# Patient Record
Sex: Male | Born: 1960 | Race: Black or African American | Hispanic: No | Smoking: Former smoker
Health system: Southern US, Community
[De-identification: ages and names within clinical notes are randomized; demographics above are authoritative.]

## PROBLEM LIST (undated history)

## (undated) DIAGNOSIS — M519 Unspecified thoracic, thoracolumbar and lumbosacral intervertebral disc disorder: Secondary | ICD-10-CM

## (undated) DIAGNOSIS — R51 Headache: Secondary | ICD-10-CM

## (undated) DIAGNOSIS — Z8719 Personal history of other diseases of the digestive system: Secondary | ICD-10-CM

## (undated) DIAGNOSIS — E785 Hyperlipidemia, unspecified: Secondary | ICD-10-CM

## (undated) DIAGNOSIS — K219 Gastro-esophageal reflux disease without esophagitis: Secondary | ICD-10-CM

## (undated) DIAGNOSIS — R0602 Shortness of breath: Secondary | ICD-10-CM

## (undated) DIAGNOSIS — I499 Cardiac arrhythmia, unspecified: Secondary | ICD-10-CM

## (undated) DIAGNOSIS — R519 Headache, unspecified: Secondary | ICD-10-CM

## (undated) DIAGNOSIS — Q245 Malformation of coronary vessels: Secondary | ICD-10-CM

## (undated) DIAGNOSIS — I428 Other cardiomyopathies: Secondary | ICD-10-CM

## (undated) DIAGNOSIS — I2510 Atherosclerotic heart disease of native coronary artery without angina pectoris: Secondary | ICD-10-CM

## (undated) DIAGNOSIS — I209 Angina pectoris, unspecified: Secondary | ICD-10-CM

## (undated) DIAGNOSIS — IMO0002 Reserved for concepts with insufficient information to code with codable children: Secondary | ICD-10-CM

## (undated) DIAGNOSIS — G4733 Obstructive sleep apnea (adult) (pediatric): Secondary | ICD-10-CM

## (undated) DIAGNOSIS — I219 Acute myocardial infarction, unspecified: Secondary | ICD-10-CM

## (undated) DIAGNOSIS — N400 Enlarged prostate without lower urinary tract symptoms: Secondary | ICD-10-CM

## (undated) DIAGNOSIS — I10 Essential (primary) hypertension: Secondary | ICD-10-CM

## (undated) DIAGNOSIS — R9430 Abnormal result of cardiovascular function study, unspecified: Secondary | ICD-10-CM

## (undated) DIAGNOSIS — R001 Bradycardia, unspecified: Secondary | ICD-10-CM

## (undated) HISTORY — DX: Unspecified thoracic, thoracolumbar and lumbosacral intervertebral disc disorder: M51.9

## (undated) HISTORY — PX: WISDOM TOOTH EXTRACTION: SHX21

## (undated) HISTORY — DX: Bradycardia, unspecified: R00.1

## (undated) HISTORY — DX: Reserved for concepts with insufficient information to code with codable children: IMO0002

## (undated) HISTORY — PX: OTHER SURGICAL HISTORY: SHX169

## (undated) HISTORY — DX: Atherosclerotic heart disease of native coronary artery without angina pectoris: I25.10

## (undated) HISTORY — DX: Essential (primary) hypertension: I10

## (undated) HISTORY — DX: Shortness of breath: R06.02

## (undated) HISTORY — DX: Other cardiomyopathies: I42.8

## (undated) HISTORY — DX: Obstructive sleep apnea (adult) (pediatric): G47.33

## (undated) HISTORY — PX: COLONOSCOPY: SHX174

## (undated) HISTORY — PX: ESOPHAGOGASTRODUODENOSCOPY: SHX1529

## (undated) HISTORY — DX: Malformation of coronary vessels: Q24.5

## (undated) HISTORY — DX: Abnormal result of cardiovascular function study, unspecified: R94.30

## (undated) HISTORY — DX: Morbid (severe) obesity due to excess calories: E66.01

## (undated) HISTORY — DX: Hyperlipidemia, unspecified: E78.5

## (undated) HISTORY — DX: Gastro-esophageal reflux disease without esophagitis: K21.9

---

## 1999-03-26 ENCOUNTER — Emergency Department (HOSPITAL_COMMUNITY): Admission: EM | Admit: 1999-03-26 | Discharge: 1999-03-26 | Payer: Self-pay | Admitting: Emergency Medicine

## 1999-03-26 ENCOUNTER — Encounter: Payer: Self-pay | Admitting: Emergency Medicine

## 2004-02-28 ENCOUNTER — Ambulatory Visit (HOSPITAL_COMMUNITY): Admission: RE | Admit: 2004-02-28 | Discharge: 2004-02-28 | Payer: Self-pay | Admitting: Neurological Surgery

## 2004-12-04 ENCOUNTER — Ambulatory Visit: Payer: Self-pay | Admitting: Cardiology

## 2004-12-12 ENCOUNTER — Ambulatory Visit: Payer: Self-pay | Admitting: Cardiology

## 2004-12-20 ENCOUNTER — Ambulatory Visit: Payer: Self-pay | Admitting: Cardiology

## 2004-12-20 ENCOUNTER — Inpatient Hospital Stay (HOSPITAL_BASED_OUTPATIENT_CLINIC_OR_DEPARTMENT_OTHER): Admission: RE | Admit: 2004-12-20 | Discharge: 2004-12-20 | Payer: Self-pay | Admitting: Cardiology

## 2004-12-20 HISTORY — PX: CARDIAC CATHETERIZATION: SHX172

## 2004-12-26 ENCOUNTER — Ambulatory Visit: Payer: Self-pay | Admitting: Cardiology

## 2005-01-22 ENCOUNTER — Ambulatory Visit: Payer: Self-pay | Admitting: Cardiology

## 2005-04-29 IMAGING — CT CT L SPINE W/ CM
3 of 9 series · 13 of 36 positions shown, 14 images · IV contrast (omnipaque)
Comparison: none

CLINICAL DATA: Low back pain.  Radiculopathy.  
LUMBAR MYELOGRAM
Dr. Gege De Rodriguez instilled 13 cc Omnipaque 180 into the subarachnoid space at the L3-4 level.  
Mild to moderate disc space narrowing and vertebral body osteophytic formations at L4-5.  Prominent anterior extradural defect L4-5 superimposed on a canal, which has a caliber probably towards the lower limits of normal on a congenital basis.  Smaller anterior extradural defect L2-3.  Anterolateral extradural defects on the thecal sac with mass effect on the L5 nerve roots.  Anterolateral extradural defects more prominent on the left than on the right.  No spondylolisthesis.  Standing lateral views under fluoroscopy were obtained in a neutral flexion and extension positions.  Decreased range of motion.  No spondylolisthesis.
IMPRESSION 
Most impressive finding noted at myelography is spinal stenosis at L4-5, likely multifactorial and accentuated by an HNP.  CT is to follow.
POSTMYELOGRAM CT SCAN LUMBAR SPINE (ANTEROTHECAL CONTRAST ? 13 CC OMNIPAQUE 180)
Transaxial cuts were acquired from L3 to S1.  From the axial data set, images were reconstructed in coronal and sagittal planes.  Findings are as follows:
L3-4:  Mild to moderate multifactorial stenosis of the central canal and lateral recesses.  Mild diffuse annular bulging and vertebral body osteophytic formation.  Small posterolateral/lateral disc protrusion involving the foramen and encroaching on the left L3 nerve root.  
L4-5:  Marked stenosis of the central canal on the lateral recesses, multifactorial.  Diffuse annular bulging and hypertrophied facets and ligamentum flavum.  Encroachment on compression of L5 nerve roots in the lateral recesses and also some encroachment on L4 roots in the foramina.  There is an HNP which is central and paracentral on the left with compression of the thecal sac and marked encroachment on the left at L5 nerve root.  There is also focal bony hypertrophy paracentrally on the left.
L5-S1:  Degenerative disc disease changes.  Stenosis of the left foramen due to multiple factors including prominent eccentric osteophytes emanating off the posterolateral aspect of the vertebral bodies in addition to disc protrusion/bulging.  Marked encroachment on the left L5 nerve root both within and lateral to the foramen.  Left L5-S1 foramen is particularly narrowed in cephalocaudal dimension as a result of the prominent osteophyte emanating off an L5 body.  Degenerative changes SI joints are noted. 
Multifactorial stenosis of lumbar spine.  Stenotic changes are particularly marked at L4-5 and are accentuated by disc herniation, which is mainly on the left.  Marked stenosis of the left L5-S1 foramen and marked encroachment on the left L5 nerve root laterally and also encroachment on the S1 nerve root within the central canal by soft disc herniation and osteophytic formation (hard disk).
CT MULTIPLANAR RECONSTRUCTION LUMBAR SPINE
Multiplanar reformatted CT images were reconstructed from the axial CT data set.  These images were reviewed and pertinent findings are included in the accompanying complete CT report.
IMPRESSION
See complete CT report.

[Series 2: l-spine helical · axial · 0.27mm/px · z∈[-306,-239]mm · 4 of 46 slices shown, 5 images]
[im 10/46  soft-tissue]
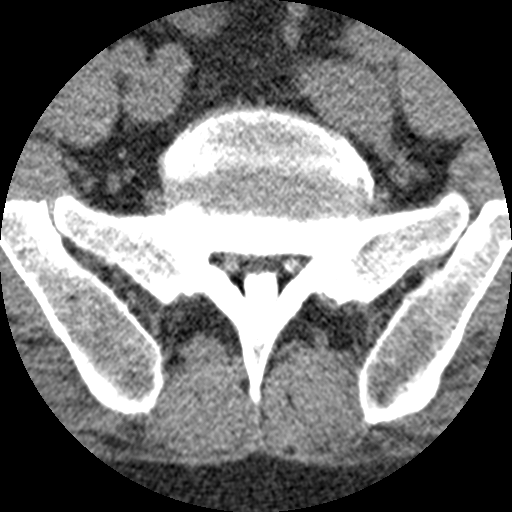
[im 10/46  bone]
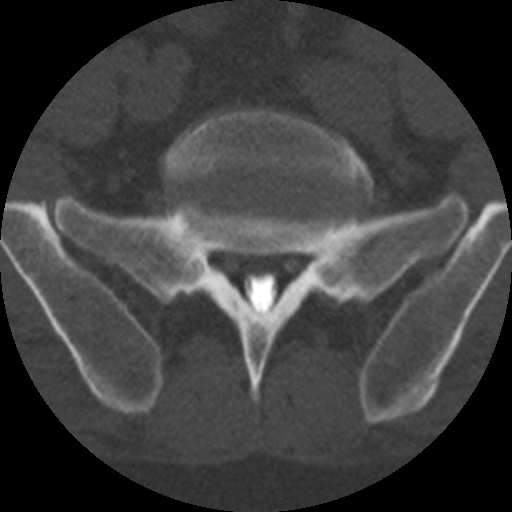
[im 19/46  bone]
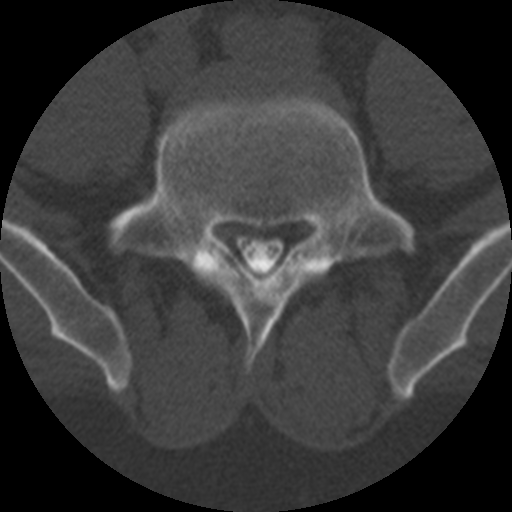
[im 28/46  bone]
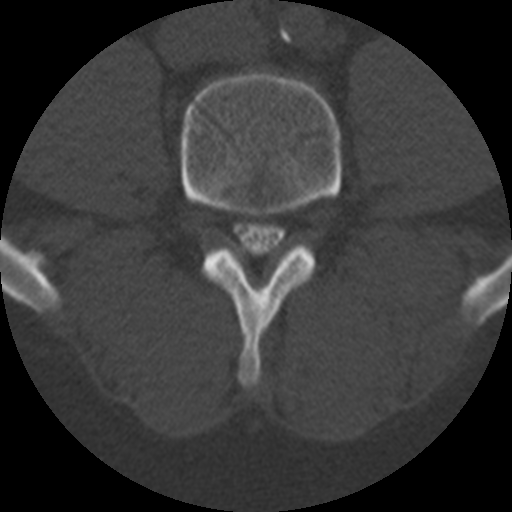
[im 37/46  bone]
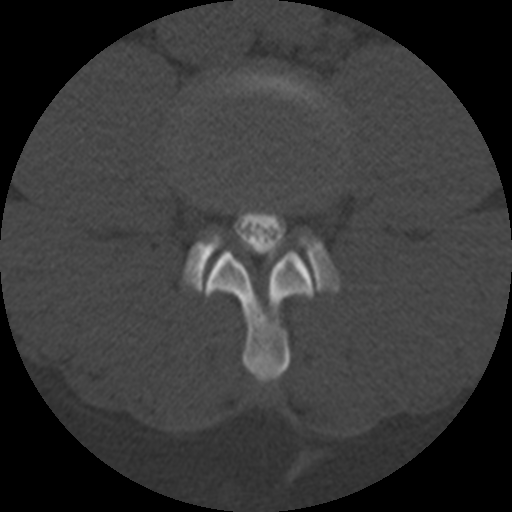

[Series 3: recon 2: l-spine helical · axial · 0.27mm/px · z∈[-301,-246]mm · 3 of 46 slices shown]
[im 12/46  bone]
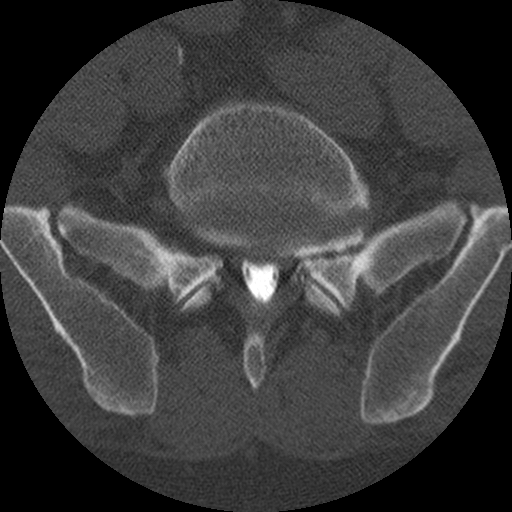
[im 23/46  bone]
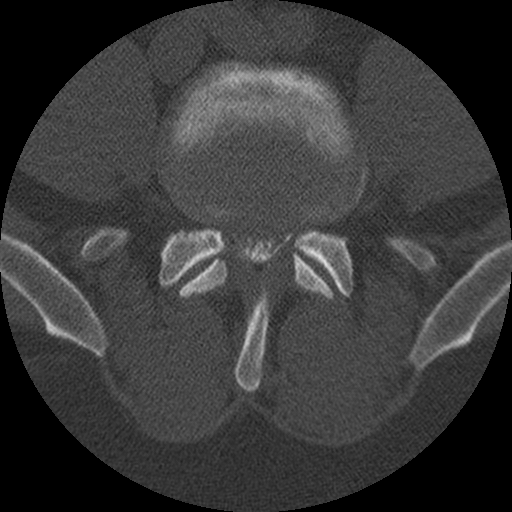
[im 34/46  bone]
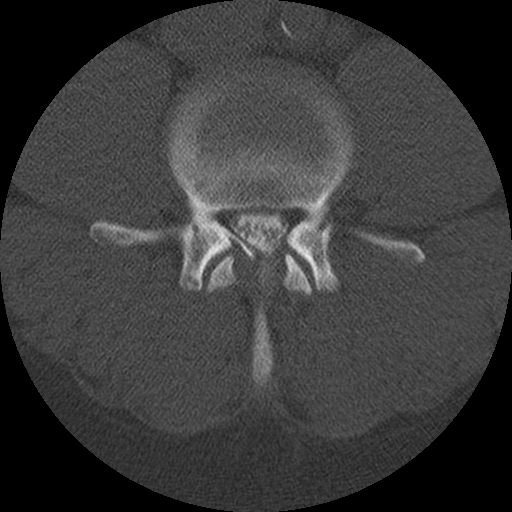

[Series 104: reformatted · coronal · 0.27mm/px · 6 of 53 slices shown]
[im 18/53  bone]
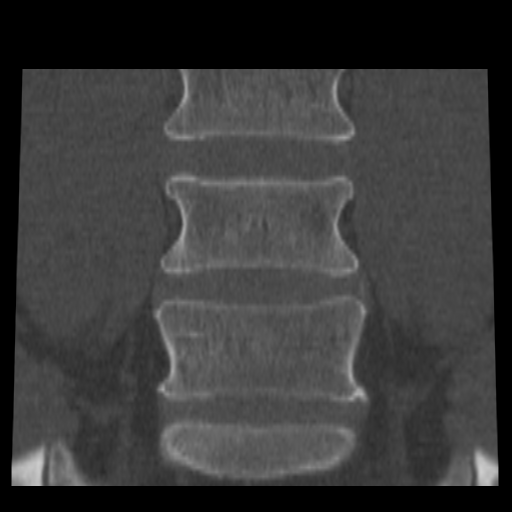
[im 22/53  bone]
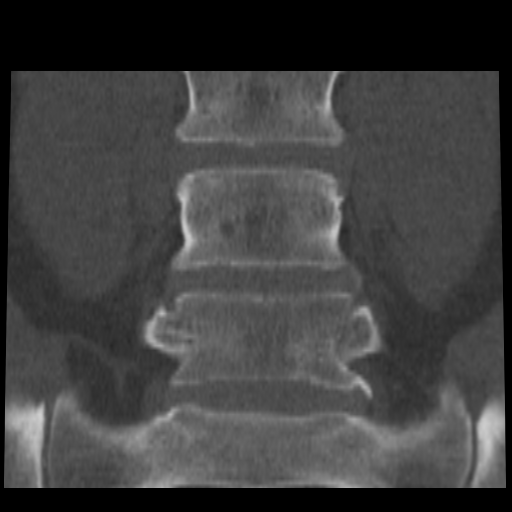
[im 26/53  soft-tissue]
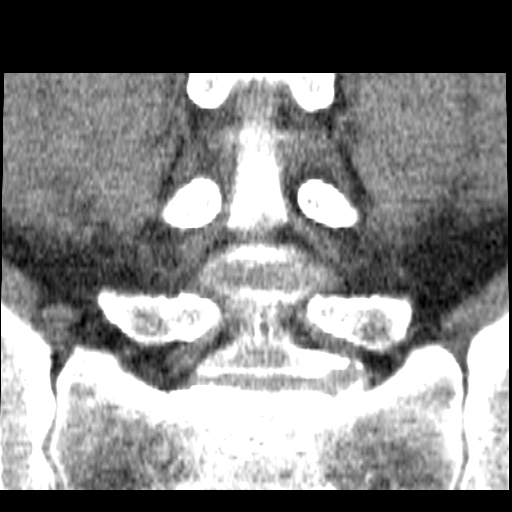
[im 27/53  bone]
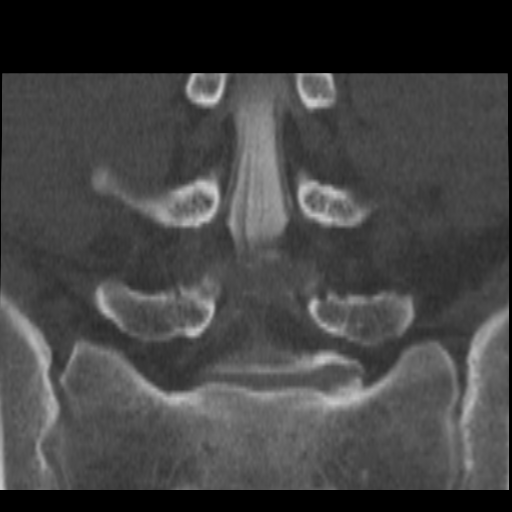
[im 31/53  bone]
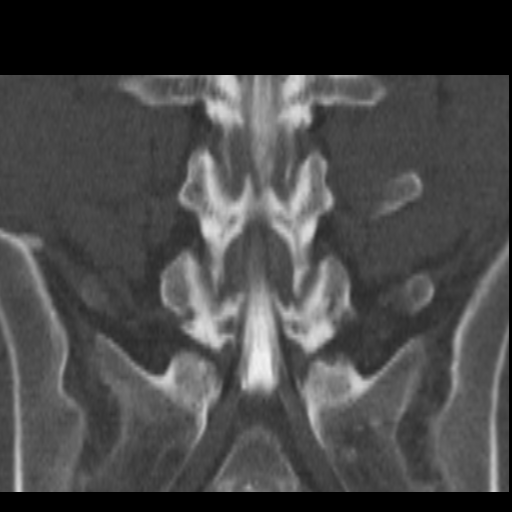
[im 35/53  bone]
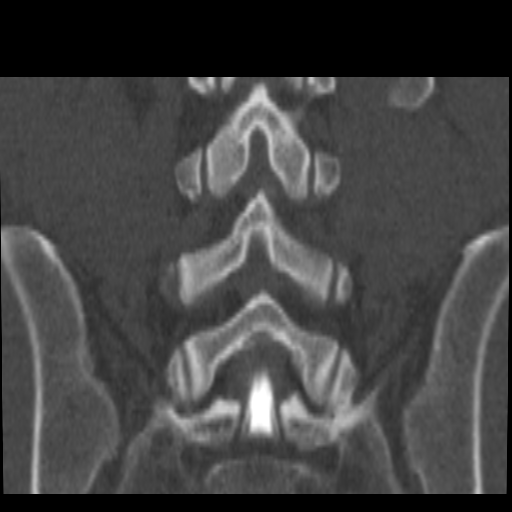

[13 of 36 positions shown; findings below may reference images not displayed]

## 2011-03-24 DIAGNOSIS — R079 Chest pain, unspecified: Secondary | ICD-10-CM

## 2011-04-03 DIAGNOSIS — R079 Chest pain, unspecified: Secondary | ICD-10-CM

## 2011-04-04 ENCOUNTER — Encounter: Payer: Self-pay | Admitting: Physician Assistant

## 2011-04-23 ENCOUNTER — Ambulatory Visit (INDEPENDENT_AMBULATORY_CARE_PROVIDER_SITE_OTHER): Payer: Medicaid Other | Admitting: Cardiology

## 2011-04-23 ENCOUNTER — Encounter: Payer: Self-pay | Admitting: *Deleted

## 2011-04-23 ENCOUNTER — Encounter: Payer: Self-pay | Admitting: Cardiology

## 2011-04-23 DIAGNOSIS — E785 Hyperlipidemia, unspecified: Secondary | ICD-10-CM | POA: Insufficient documentation

## 2011-04-23 DIAGNOSIS — K219 Gastro-esophageal reflux disease without esophagitis: Secondary | ICD-10-CM | POA: Insufficient documentation

## 2011-04-23 DIAGNOSIS — I428 Other cardiomyopathies: Secondary | ICD-10-CM | POA: Insufficient documentation

## 2011-04-23 DIAGNOSIS — I10 Essential (primary) hypertension: Secondary | ICD-10-CM

## 2011-04-23 DIAGNOSIS — R9430 Abnormal result of cardiovascular function study, unspecified: Secondary | ICD-10-CM | POA: Insufficient documentation

## 2011-04-23 DIAGNOSIS — R001 Bradycardia, unspecified: Secondary | ICD-10-CM | POA: Insufficient documentation

## 2011-04-23 DIAGNOSIS — Q245 Malformation of coronary vessels: Secondary | ICD-10-CM

## 2011-04-23 DIAGNOSIS — I2510 Atherosclerotic heart disease of native coronary artery without angina pectoris: Secondary | ICD-10-CM

## 2011-04-23 DIAGNOSIS — M519 Unspecified thoracic, thoracolumbar and lumbosacral intervertebral disc disorder: Secondary | ICD-10-CM | POA: Insufficient documentation

## 2011-04-23 NOTE — Assessment & Plan Note (Signed)
This finding does not require any further workup.

## 2011-04-23 NOTE — Assessment & Plan Note (Signed)
Ejection fraction now is 50%.  No further workup is needed.

## 2011-04-23 NOTE — Patient Instructions (Signed)
Follow up as scheduled. Your physician recommends that you continue on your current medications as directed. Please refer to the Current Medication list given to you today. 

## 2011-04-23 NOTE — Assessment & Plan Note (Signed)
In 2000, there was no significant coronary disease.  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  In 2006 catheterization did show some diffuse irregularities but no significant stenoses.  The current echo reveals an ejection fraction of 50%.  There seems to be hypokinesis of the base of the inferior wall.  Patient has had some wall motion abnormalities described in the past when his coronaries revealed no significant abnormalities.  There was question of a nuclear abnormality in 2006 when the patient had no significant stenoses.  It is very difficult to know how to proceed in this situation.  His symptoms at this time did not seem to be compatible with exertional angina.  I decided to watch him clinically and bring him back for followup.  He has felt well for several weeks.  If he has return of significant symptoms we will proceed with repeat catheterization.

## 2011-04-23 NOTE — Assessment & Plan Note (Signed)
He clearly needs to lose weight and he is encouraged by me to do so.

## 2011-04-23 NOTE — Assessment & Plan Note (Signed)
The patient may have some GERD.  This might well some of his symptoms.  We will recommend a PPI

## 2011-04-23 NOTE — Progress Notes (Signed)
HPI Patient is seen for cardiac evaluation posthospitalization.  I have reviewed the hospital records from his hospitalization on March 24, 2011.  I reviewed the H&P and discharge summary.  I reviewed the labs.  I reviewed the cardiology consultation.  I reviewed the echo data.  The patient was hospitalized with some chest discomfort.  He has had this problem over the years.  In 2000, there is report of catheterization revealing normal coronaries but an ejection fraction of 30%.  Over time his EF improved to 50%.  In 2006 cardiac catheterization was done again.  At that time there was question the possibility of inferior scar on nuclear scan.  At catheterization revealed only very slight coronary disease although there were some diffuse irregularities.  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  After the patient's recent hospitalization he has again had some symptoms on 2 occasions.  He describes walking through a store and then feeling shortness of breath and dizziness.  He does not have significant palpitations.  He has been stable over the past few weeks. No Known Allergies  Current Outpatient Prescriptions  Medication Sig Dispense Refill  . alfuzosin (UROXATRAL) 10 MG 24 hr tablet Take 10 mg by mouth daily.        Marland Kitchen aspirin 81 MG EC tablet Take 81 mg by mouth daily.        Marland Kitchen atenolol (TENORMIN) 50 MG tablet Take 50 mg by mouth daily.        Marland Kitchen gabapentin (NEURONTIN) 600 MG tablet Take 600 mg by mouth daily as needed.       Marland Kitchen glipiZIDE (GLUCOTROL XL) 10 MG 24 hr tablet Take 10 mg by mouth daily.        . insulin lispro protamine-insulin lispro (HUMALOG 75/25) (75-25) 100 UNIT/ML SUSP Inject into the skin as directed.        Marland Kitchen lisinopril-hydrochlorothiazide (PRINZIDE,ZESTORETIC) 20-25 MG per tablet Take 2 tablets by mouth daily.       . metFORMIN (GLUCOPHAGE) 1000 MG tablet Take 1,000 mg by mouth 2 (two) times daily with a meal.        .  omeprazole (PRILOSEC) 20 MG capsule Take 20 mg by mouth 3 (three) times daily.        Marland Kitchen oxyCODONE-acetaminophen (PERCOCET) 10-325 MG per tablet Take 1 tablet by mouth as needed.        . pravastatin (PRAVACHOL) 40 MG tablet Take 40 mg by mouth daily.        . traMADol (ULTRAM) 50 MG tablet Take 50 mg by mouth every 6 (six) hours as needed.        . verapamil (CALAN) 120 MG tablet Take 120 mg by mouth 2 (two) times daily.          History   Social History  . Marital Status: Married    Spouse Name: N/A    Number of Children: N/A  . Years of Education: N/A   Occupational History  . DISABLED     disc disease,DM   Social History Main Topics  . Smoking status: Former Smoker -- 1.0 packs/day for 5 years    Types: Cigarettes    Quit date: 09/22/1989  . Smokeless tobacco: Former Neurosurgeon    Types: Snuff    Quit date: 09/22/1989   Comment: dipped snuff for one year  . Alcohol Use: Not on file  . Drug Use: Not on file  . Sexually  Active: Not on file   Other Topics Concern  . Not on file   Social History Narrative   Lives in [place] with wife.     Family History  Problem Relation Age of Onset  . Other Sister     has pacemaker  . Other Brother     has pacemaker    Past Medical History  Diagnosis Date  . 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 Ejection fraction     Improved from the past, EF 50%, echo, July, 2012  . Hypertension   . Dyslipidemia   . Diabetes mellitus   . GERD (gastroesophageal reflux disease)   . 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 Morbid obesity   . Lumbar disc disease   . Bradycardia     July, 2012, related to medication  . CAD (coronary artery disease)     Very minimal coronary disease by catheterization in 2006    Past Surgical History  Procedure Date  . Cardiac  catheterization 12/20/2004    EF was 60% with normal wall motion. Nonobstructive coronary artery disease.  . None     no surgeries noted    ROS  Patient denies fever, chills, headache, sweats, rash, change in vision, change in hearing, chest pain, cough, nausea vomiting, urinary symptoms.  All the systems are reviewed and are negative  PHYSICAL EXAM Patient is here today with his wife.  He is significantly overweight.  He is stable.  There is no xanthelasma.  Lungs are clear.  Respiratory effort is nonlabored.  There is no jugular venous distention.  Cardiac exam reveals S1 and S2.  There are no clicks or significant murmurs.  The abdomen is soft.  There is no peripheral edema.  There no musculoskeletal deformities.  There are no significant skin rashes. Filed Vitals:   04/23/11 1442  BP: 129/80  Pulse: 63  Height: 5\' 11"  (1.803 m)  Weight: 314 lb (142.429 kg)    EKG Is not done today.  ASSESSMENT & PLAN

## 2011-04-23 NOTE — Assessment & Plan Note (Signed)
Blood pressure is under reasonable control at this time.  No change in therapy.

## 2011-06-06 ENCOUNTER — Encounter: Payer: Self-pay | Admitting: Cardiology

## 2011-06-06 ENCOUNTER — Ambulatory Visit (INDEPENDENT_AMBULATORY_CARE_PROVIDER_SITE_OTHER): Payer: Medicaid Other | Admitting: Cardiology

## 2011-06-06 DIAGNOSIS — R001 Bradycardia, unspecified: Secondary | ICD-10-CM

## 2011-06-06 DIAGNOSIS — R0602 Shortness of breath: Secondary | ICD-10-CM

## 2011-06-06 DIAGNOSIS — I498 Other specified cardiac arrhythmias: Secondary | ICD-10-CM

## 2011-06-06 DIAGNOSIS — I2510 Atherosclerotic heart disease of native coronary artery without angina pectoris: Secondary | ICD-10-CM

## 2011-06-06 MED ORDER — FUROSEMIDE 20 MG PO TABS: 20.0000 mg | ORAL_TABLET | Freq: Every day | ORAL | Status: DC

## 2011-06-06 NOTE — Patient Instructions (Signed)
Follow up as scheduled. Start Lasix (furosemide) 20 mg daily.

## 2011-06-06 NOTE — Assessment & Plan Note (Signed)
The patient's shortness of breath may be from volume overload.  He does have some edema today.  He may also be having ischemia.  He is on a small dose of hydrochlorothiazide as part of one of his medications.  I decided not to change this but in fact add a small dose of Lasix to this.  I talked to him about salt and fluid limitation.  I'll see him back over several weeks to follow his status.  If he diuresis and feels better all watch him.  If he is not improved I will have to reconsider catheterization.

## 2011-06-06 NOTE — Progress Notes (Signed)
HPI Patient is seen today to followup shortness of breath.  I saw him last August 1, 201 at that time I reviewed the data with him concerning his coronary artery disease.  There is question of slight ischemia by nuclear scan, but he has had normal catheterizations in the past. ( it is noted that the last one was in 2006).  Today the patient says that he has shortness of breath.  There may be some PND and orthopnea.  There is some edema.  There is exertional shortness of breath.  Is not having any significant chest pain. No Known Allergies  Current Outpatient Prescriptions  Medication Sig Dispense Refill  . alfuzosin (UROXATRAL) 10 MG 24 hr tablet Take 10 mg by mouth daily.        Marland Kitchen aspirin 81 MG EC tablet Take 81 mg by mouth daily.        Marland Kitchen atenolol (TENORMIN) 50 MG tablet Take 50 mg by mouth daily.        Marland Kitchen gabapentin (NEURONTIN) 600 MG tablet Take 600 mg by mouth daily as needed.       Marland Kitchen glipiZIDE (GLUCOTROL XL) 10 MG 24 hr tablet Take 10 mg by mouth daily.        . insulin lispro protamine-insulin lispro (HUMALOG 75/25) (75-25) 100 UNIT/ML SUSP Inject into the skin as directed.        Marland Kitchen lisinopril-hydrochlorothiazide (PRINZIDE,ZESTORETIC) 20-25 MG per tablet Take 2 tablets by mouth daily.       . metFORMIN (GLUCOPHAGE) 1000 MG tablet Take 1,000 mg by mouth 2 (two) times daily with a meal.        . omeprazole (PRILOSEC) 20 MG capsule Take 20 mg by mouth 2 (two) times daily.       Marland Kitchen oxyCODONE-acetaminophen (PERCOCET) 10-325 MG per tablet Take 1 tablet by mouth as needed.        . pravastatin (PRAVACHOL) 40 MG tablet Take 40 mg by mouth daily.        . traMADol (ULTRAM) 50 MG tablet Take 50 mg by mouth every 6 (six) hours as needed.        . verapamil (CALAN) 120 MG tablet Take 120 mg by mouth 2 (two) times daily.          History   Social History  . Marital Status: Married    Spouse Name: N/A    Number of Children: N/A  . Years of Education: N/A   Occupational History  . DISABLED    disc disease,DM   Social History Main Topics  . Smoking status: Former Smoker -- 1.0 packs/day for 5 years    Types: Cigarettes    Quit date: 09/22/1989  . Smokeless tobacco: Former Neurosurgeon    Types: Snuff    Quit date: 09/22/1989   Comment: dipped snuff for one year  . Alcohol Use: Not on file  . Drug Use: Not on file  . Sexually Active: Not on file   Other Topics Concern  . Not on file   Social History Narrative   Lives in [place] with wife.     Family History  Problem Relation Age of Onset  . Other Sister     has pacemaker  . Other Brother     has pacemaker    Past Medical History  Diagnosis Date  . Nonischemic cardiomyopathy     Catheterization 2000, normal coronaries, reduced ejection fraction  /  catheterization 2006 minimal scattered disease, anomalous right coronary artery from  left cusp  . Ejection fraction     Improved from the past  /  ejection fraction 50%, echo, July, 2012, hypokinesis at the base of the inferior wall.  . Hypertension   . Dyslipidemia   . Diabetes mellitus   . GERD (gastroesophageal reflux disease)   . 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 Morbid obesity   . Lumbar disc disease   . Bradycardia     July, 2012, related to medication  . CAD (coronary artery disease)     Some coronary irregularities by catheterization 2006 /  nuclear, July, 20 12  ,  question of some ischemia in the lateral wall although technically quite difficult.    Past Surgical History  Procedure Date  . Cardiac catheterization 12/20/2004    EF was 60% with normal wall motion. Nonobstructive coronary artery disease.  . None     no surgeries noted    ROS  Patient denies fever, chills, headache, sweats, rash, change in vision, change in hearing, chest pain, cough, nausea vomiting, urinary symptoms.  All other systems are reviewed and are negative.  PHYSICAL EXAM Patient is overweight.  Head is atraumatic.  He is oriented to person time and  place.  Affect is normal.  Lungs are clear.  Respiratory effort is unlabored.  Cardiac exam reveals S1-S2 regular no clicks or significant murmurs.  The abdomen is protuberant but soft.  There is 1+ peripheral edema.  There are no musculoskeletal deformities.  No skin rashes. Filed Vitals:   06/06/11 1333  BP: 137/83  Pulse: 61  Resp: 16  Height: 5\' 11"  (1.803 m)  Weight: 316 lb (143.337 kg)    EKG EKG is not done today. ASSESSMENT & PLAN

## 2011-06-06 NOTE — Assessment & Plan Note (Signed)
I do not think the bradycardia is playing a major role in his symptoms.

## 2011-06-06 NOTE — Assessment & Plan Note (Signed)
Patient has only minimal coronary disease.  However he could have progressed since 2006.

## 2011-06-27 ENCOUNTER — Ambulatory Visit (INDEPENDENT_AMBULATORY_CARE_PROVIDER_SITE_OTHER): Payer: Medicaid Other | Admitting: Physician Assistant

## 2011-06-27 ENCOUNTER — Encounter: Payer: Self-pay | Admitting: Cardiology

## 2011-06-27 DIAGNOSIS — I428 Other cardiomyopathies: Secondary | ICD-10-CM

## 2011-06-27 DIAGNOSIS — G4730 Sleep apnea, unspecified: Secondary | ICD-10-CM | POA: Insufficient documentation

## 2011-06-27 DIAGNOSIS — I10 Essential (primary) hypertension: Secondary | ICD-10-CM

## 2011-06-27 DIAGNOSIS — R0602 Shortness of breath: Secondary | ICD-10-CM

## 2011-06-27 NOTE — Assessment & Plan Note (Signed)
Will refer to Dr Fredonia Highland, regarding management of his CPAP parameters. Patient reports having lost instructions as to use of the device.

## 2011-06-27 NOTE — Assessment & Plan Note (Signed)
Recently placed on low dose Lasix, with no appreciable change in symptoms or weight. Will check f/u BMET.

## 2011-06-27 NOTE — Patient Instructions (Signed)
Your physician recommends that you go to the [facility] for lab work for FLP & CMET.  Reminder:  Nothing to eat or drink after 12 midnight prior to labs. If the results of your test are normal or stable, you will receive a letter.  If they are abnormal, the nurse will contact you by phone. Referral to Dr. Andrey Campanile for manangement of CPAP Your physician wants you to follow up in:  4 months.  You will receive a reminder letter in the mail one-two months in advance.  If you don't receive a letter, please call our office to schedule the follow up appointment

## 2011-06-27 NOTE — Assessment & Plan Note (Signed)
Stable on current medication regimen. Follow up with Dr Olena Leatherwood, as scheduled.

## 2011-06-27 NOTE — Progress Notes (Signed)
HPI: patient returns for early scheduled follow up.  He reports no appreciable change in his baseline, since last seen on August 1st. He has not had any f/u labs, since being placed on low dose Lasix. He is due to see Dr Olena Leatherwood for scheduled f/u, next week.  He denies any exertional CP, and reports improvement in his DOE. Interestingly, he suggests that he is SOB with exertion, only when his hiatal hernia is bothering him. He states this was diagnosed years ago. He has reflux symptoms on occasion. He has also been diagnosed with OSA, but has not been compliant with his CPAP, in several months.  Patient did undergo a post hospital ischemic evaluation with a Lexiscan Cardiolite, on August 11th, yielding possible anterolateral wall ischemia; EF 46%. Most recent catheterization in 2006, at [facility], suggested diffuse irregularities with NL LVF, and anomalous RCA from the left coronary cusp.  No Known Allergies  Current Outpatient Prescriptions on File Prior to Visit  Medication Sig Dispense Refill  . alfuzosin (UROXATRAL) 10 MG 24 hr tablet Take 10 mg by mouth daily.        Marland Kitchen atenolol (TENORMIN) 50 MG tablet Take 50 mg by mouth daily.        . furosemide (LASIX) 20 MG tablet Take 1 tablet (20 mg total) by mouth daily.  30 tablet  6  . gabapentin (NEURONTIN) 600 MG tablet Take 600 mg by mouth daily as needed.       Marland Kitchen glipiZIDE (GLUCOTROL XL) 10 MG 24 hr tablet Take 10 mg by mouth daily.        . insulin lispro protamine-insulin lispro (HUMALOG 75/25) (75-25) 100 UNIT/ML SUSP Inject into the skin as directed.        Marland Kitchen lisinopril-hydrochlorothiazide (PRINZIDE,ZESTORETIC) 20-25 MG per tablet Take 2 tablets by mouth daily.       . metFORMIN (GLUCOPHAGE) 1000 MG tablet Take 1,000 mg by mouth 2 (two) times daily with a meal.        . omeprazole (PRILOSEC) 20 MG capsule Take 20 mg by mouth 2 (two) times daily.       Marland Kitchen oxyCODONE-acetaminophen (PERCOCET) 10-325 MG per tablet Take 1 tablet by mouth as  needed.        . pravastatin (PRAVACHOL) 40 MG tablet Take 40 mg by mouth daily.        . traMADol (ULTRAM) 50 MG tablet Take 50 mg by mouth every 6 (six) hours as needed.        . verapamil (CALAN) 120 MG tablet Take 120 mg by mouth 2 (two) times daily.          Past Medical History  Diagnosis Date  . 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 Ejection fraction     Improved from the past  /  ejection fraction 50%, echo, July, 2012, hypokinesis at the base of the inferior wall.  . Hypertension   . Dyslipidemia   . Diabetes mellitus   . GERD (gastroesophageal reflux disease)   . 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 Morbid obesity   . Lumbar disc disease   . Bradycardia     July, 2012, related to medication  . CAD (coronary artery disease)     Some coronary irregularities by catheterization 2006 /  nuclear, July, 20 12  ,  question of some ischemia in the lateral wall although technically quite difficult.  . Shortness of breath     September, 2012    Past Surgical History  Procedure Date  . Cardiac catheterization 12/20/2004    EF was 60% with normal wall motion. Nonobstructive coronary artery disease.  . None     no surgeries noted    History   Social History  . Marital Status: Married    Spouse Name: N/A    Number of Children: N/A  . Years of Education: N/A   Occupational History  . DISABLED     disc disease,DM   Social History Main Topics  . Smoking status: Former Smoker -- 1.0 packs/day for 5 years    Types: Cigarettes    Quit date: 09/22/1989  . Smokeless tobacco: Former Neurosurgeon    Types: Snuff    Quit date: 09/22/1989   Comment: dipped snuff for one year  . Alcohol Use: Not on file  . Drug Use: Not on file  . Sexually Active: Not on file   Other Topics Concern  . Not on file     Social History Narrative   Lives in [place] with wife.     Family History  Problem Relation Age of Onset  . Other Sister     has pacemaker  . Other Brother     has pacemaker    ROS: Negative for exertional chest pain, orthopnea, lower extremity edema, palpitations, presyncope/syncope, claudication, reflux, hematuria, hematochezia, or melena. Remaining systems reviewed, and are negative.   PHYSICAL EXAM:  BP 132/84  Pulse 98  Resp 18  Ht 5\' 11"  (1.803 m)  Wt 315 lb (142.883 kg)  BMI 43.93 kg/m2  GENERAL: 50 yom, obese, NAD HEENT: NCAT, PERRLA, EOMI; sclera clear; no xanthelasma NECK: no bruits; no JVD; no TM LUNGS: CTA bilaterally CARDIAC: RRR (S1, S2); no significant murmurs; no rubs or gallops ABDOMEN: protuberant EXTREMETIES: no significant peripheral edema SKIN: warm/dry; no obvious rash/lesions MUSCULOSKELETAL: no joint deformity NEURO: no focal deficit; NL affect   EKG:    ASSESSMENT & PLAN:

## 2011-06-27 NOTE — Assessment & Plan Note (Addendum)
No further workup, at this point in time. Patient is not reporting any exertional CP, and had a recent, low risk Cardiolite scan. He has known non obstructive CAD, by 2 previous studies, most recently in 2006. Recommend continued aggressive risk factor modification. Will arrange return clinic visit, with Dr Myrtis Ser, in 4 months.  Patient seen and examined in conjunction with Dr Myrtis Ser.

## 2011-07-01 ENCOUNTER — Other Ambulatory Visit: Payer: Self-pay | Admitting: *Deleted

## 2011-07-01 DIAGNOSIS — G4730 Sleep apnea, unspecified: Secondary | ICD-10-CM

## 2011-07-09 ENCOUNTER — Telehealth: Payer: Self-pay | Admitting: *Deleted

## 2011-07-09 NOTE — Telephone Encounter (Signed)
Message copied by Arlyss Gandy on Wed Jul 09, 2011  1:21 PM ------      Message from: Zachary George T      Created: Wed Jul 09, 2011 12:19 PM      Regarding: RE: cpap management       Spoke with Mr. Reiley today and explained to him that Dr.Wilsons      Office will not accept our referral due to [facility].      Dr.Hasanaj will have to make the referral because that is the name on his Medicaid Card. He understood the process and      Will make contact with Dr.Hasanaj.      ----- Message -----         From: Megan Salon         Sent: 07/02/2011   1:41 PM           To: Megan Salon, Cyril Loosen, RN      Subject: RE: cpap management                                      Called Dr. Tawana Scale office and they will only accept referral      From the PCP. This patient is [place].  Will call Mr.      Oyen and notify him to contact Dr. Olena Leatherwood to refer him.            ----- Message -----         From: Cyril Loosen, RN         Sent: 07/01/2011   2:47 PM           To: Megan Salon      Subject: cpap management                                          Can you refer to Dr. Andrey Campanile for CPAP management please? Thanks!

## 2012-01-31 ENCOUNTER — Other Ambulatory Visit: Payer: Self-pay | Admitting: Cardiology

## 2012-04-05 ENCOUNTER — Other Ambulatory Visit: Payer: Self-pay | Admitting: Cardiology

## 2012-06-03 DIAGNOSIS — R079 Chest pain, unspecified: Secondary | ICD-10-CM

## 2015-10-17 ENCOUNTER — Other Ambulatory Visit: Payer: Self-pay | Admitting: Physical Medicine and Rehabilitation

## 2015-10-17 DIAGNOSIS — M5416 Radiculopathy, lumbar region: Secondary | ICD-10-CM

## 2015-12-27 ENCOUNTER — Other Ambulatory Visit: Payer: Self-pay | Admitting: Neurological Surgery

## 2016-01-11 NOTE — Pre-Procedure Instructions (Signed)
Michael Vaughn  01/11/2016      [facility] [address], [place] - [address] [place] [address] Phone: [phone] Fax: [phone]    Your procedure is scheduled on May 2  Report to [facility] Admitting at  1200noon  Call this number if you have problems the morning of surgery:  [phone]   Remember:  Do not eat food or drink liquids after midnight.  Take these medicines the morning of surgery with A SIP OF WATER Allopurinol (Zyloprim), carvedilol (Coreg), Eye drops (Cosopt), gabapentin (Neurontin), Hydrocodone (norco), ranitidine (Zantac),  tamsulosin (Flomax), Verapamil (Calan-SR)   Stop taking aspirin, BC's, Goody's, Herbal medications, Fish Oil, Ibuprofen, Advil, Motrin, Aleve   How to Manage Your Diabetes Before and After Surgery  Why is it important to control my blood sugar before and after surgery? . Improving blood sugar levels before and after surgery helps healing and can limit problems. . A way of improving blood sugar control is eating a healthy diet by: o  Eating less sugar and carbohydrates o  Increasing activity/exercise o  Talking with your doctor about reaching your blood sugar goals . High blood sugars (greater than 180 mg/dL) can raise your risk of infections and slow your recovery, so you will need to focus on controlling your diabetes during the weeks before surgery. . Make sure that the doctor who takes care of your diabetes knows about your planned surgery including the date and location.  How do I manage my blood sugar before surgery? . Check your blood sugar at least 4 times a day, starting 2 days before surgery, to make sure that the level is not too high or low. o Check your blood sugar the morning of your surgery when you wake up and every 2 hours until you get to the Short Stay unit. . If your blood sugar is less than 70 mg/dL, you will need to treat for low blood sugar: o Do not take insulin. o Treat a low  blood sugar (less than 70 mg/dL) with  cup of clear juice (cranberry or apple), 4 glucose tablets, OR glucose gel. o Recheck blood sugar in 15 minutes after treatment (to make sure it is greater than 70 mg/dL). If your blood sugar is not greater than 70 mg/dL on recheck, call [phone] for further instructions. . Report your blood sugar to the short stay nurse when you get to Short Stay.  . If you are admitted to the hospital after surgery: o Your blood sugar will be checked by the staff and you will probably be given insulin after surgery (instead of oral diabetes medicines) to make sure you have good blood sugar levels. o The goal for blood sugar control after surgery is 80-180 mg/dL.              WHAT DO I DO ABOUT MY DIABETES MEDICATION?   Marland Kitchen Do not take oral diabetes medicines (pills) the morning of surgery. Metformin (Glucophage, Sitagliptin [place]), Glipizide (Glucotrol XL)  . THE NIGHT BEFORE SURGERY, take 42 units of Humalog 75/25insulin.    .  . The day of surgery, do not take other diabetes injectables, including Byetta (exenatide), Bydureon (exenatide ER), Victoza (liraglutide), or Trulicity (dulaglutide).  . If your CBG is greater than 220 mg/dL, you may take  of your sliding scale (correction) dose of insulin.  Other Instructions:          Patient Signature:  Date:  Nurse Signature:  Date:   Reviewed and Endorsed by [facility] Patient Education Committee, August 2015  Do not wear jewelry, make-up or nail polish.  Do not wear lotions, powders, or perfumes.  You may wear deodorant.  Do not shave 48 hours prior to surgery.  Men may shave face and neck.  Do not bring valuables to the hospital.  [facility] is not responsible for any belongings or valuables.  Contacts, dentures or bridgework may not be worn into surgery.  Leave your suitcase in the car.  After surgery it may be brought to your room.  For patients admitted to the hospital, discharge  time will be determined by your treatment team.  Patients discharged the day of surgery will not be allowed to drive home.   Special instructions:  [place] - Preparing for Surgery  Before surgery, you can play an important role.  Because skin is not sterile, your skin needs to be as free of germs as possible.  You can reduce the number of germs on you skin by washing with CHG (chlorahexidine gluconate) soap before surgery.  CHG is an antiseptic cleaner which kills germs and bonds with the skin to continue killing germs even after washing.  Please DO NOT use if you have an allergy to CHG or antibacterial soaps.  If your skin becomes reddened/irritated stop using the CHG and inform your nurse when you arrive at Short Stay.  Do not shave (including legs and underarms) for at least 48 hours prior to the first CHG shower.  You may shave your face.  Please follow these instructions carefully:   1.  Shower with CHG Soap the night before surgery and the                                morning of Surgery.  2.  If you choose to wash your hair, wash your hair first as usual with your       normal shampoo.  3.  After you shampoo, rinse your hair and body thoroughly to remove the                      Shampoo.  4.  Use CHG as you would any other liquid soap.  You can apply chg directly       to the skin and wash gently with scrungie or a clean washcloth.  5.  Apply the CHG Soap to your body ONLY FROM THE NECK DOWN.        Do not use on open wounds or open sores.  Avoid contact with your eyes,       ears, mouth and genitals (private parts).  Wash genitals (private parts)       with your normal soap.  6.  Wash thoroughly, paying special attention to the area where your surgery        will be performed.  7.  Thoroughly rinse your body with warm water from the neck down.  8.  DO NOT shower/wash with your normal soap after using and rinsing off       the CHG Soap.  9.  Pat yourself dry with a clean towel.             10.  Wear clean pajamas.            11.  Place clean sheets on your bed the night of your  first shower and do not        sleep with pets.  Day of Surgery  Do not apply any lotions/deoderants the morning of surgery.  Please wear clean clothes to the hospital/surgery center.     Please read over the following fact sheets that you were given. Pain Booklet, Coughing and Deep Breathing, MRSA Information and Surgical Site Infection Prevention

## 2016-01-14 ENCOUNTER — Encounter (HOSPITAL_COMMUNITY)
Admission: RE | Admit: 2016-01-14 | Discharge: 2016-01-14 | Disposition: A | Discharge status: Home / Self Care | Payer: Worker's Compensation | Source: Ambulatory Visit | Attending: Neurological Surgery | Admitting: Neurological Surgery

## 2016-01-14 ENCOUNTER — Encounter (HOSPITAL_COMMUNITY): Payer: Self-pay

## 2016-01-14 DIAGNOSIS — Z87891 Personal history of nicotine dependence: Secondary | ICD-10-CM | POA: Insufficient documentation

## 2016-01-14 DIAGNOSIS — Z01812 Encounter for preprocedural laboratory examination: Secondary | ICD-10-CM | POA: Insufficient documentation

## 2016-01-14 DIAGNOSIS — I428 Other cardiomyopathies: Secondary | ICD-10-CM | POA: Diagnosis not present

## 2016-01-14 DIAGNOSIS — I252 Old myocardial infarction: Secondary | ICD-10-CM | POA: Diagnosis not present

## 2016-01-14 DIAGNOSIS — R9431 Abnormal electrocardiogram [ECG] [EKG]: Secondary | ICD-10-CM | POA: Diagnosis not present

## 2016-01-14 DIAGNOSIS — Z7982 Long term (current) use of aspirin: Secondary | ICD-10-CM | POA: Insufficient documentation

## 2016-01-14 DIAGNOSIS — M5416 Radiculopathy, lumbar region: Secondary | ICD-10-CM | POA: Diagnosis not present

## 2016-01-14 DIAGNOSIS — I2510 Atherosclerotic heart disease of native coronary artery without angina pectoris: Secondary | ICD-10-CM | POA: Insufficient documentation

## 2016-01-14 DIAGNOSIS — Z01818 Encounter for other preprocedural examination: Secondary | ICD-10-CM | POA: Insufficient documentation

## 2016-01-14 DIAGNOSIS — I10 Essential (primary) hypertension: Secondary | ICD-10-CM | POA: Insufficient documentation

## 2016-01-14 DIAGNOSIS — E785 Hyperlipidemia, unspecified: Secondary | ICD-10-CM | POA: Insufficient documentation

## 2016-01-14 DIAGNOSIS — E119 Type 2 diabetes mellitus without complications: Secondary | ICD-10-CM | POA: Diagnosis not present

## 2016-01-14 DIAGNOSIS — Z794 Long term (current) use of insulin: Secondary | ICD-10-CM | POA: Diagnosis not present

## 2016-01-14 DIAGNOSIS — G4733 Obstructive sleep apnea (adult) (pediatric): Secondary | ICD-10-CM | POA: Insufficient documentation

## 2016-01-14 DIAGNOSIS — Z79899 Other long term (current) drug therapy: Secondary | ICD-10-CM | POA: Insufficient documentation

## 2016-01-14 HISTORY — DX: Personal history of other diseases of the digestive system: Z87.19

## 2016-01-14 HISTORY — DX: Angina pectoris, unspecified: I20.9

## 2016-01-14 HISTORY — DX: Acute myocardial infarction, unspecified: I21.9

## 2016-01-14 HISTORY — DX: Headache: R51

## 2016-01-14 HISTORY — DX: Headache, unspecified: R51.9

## 2016-01-14 HISTORY — DX: Cardiac arrhythmia, unspecified: I49.9

## 2016-01-14 LAB — CBC
HEMATOCRIT: 35.3 % — AB (ref 39.0–52.0)
Hemoglobin: 12.2 g/dL — ABNORMAL LOW (ref 13.0–17.0)
MCH: 31.3 pg (ref 26.0–34.0)
MCHC: 34.6 g/dL (ref 30.0–36.0)
MCV: 90.5 fL (ref 78.0–100.0)
Platelets: 241 10*3/uL (ref 150–400)
RBC: 3.9 MIL/uL — AB (ref 4.22–5.81)
RDW: 13.6 % (ref 11.5–15.5)
WBC: 9.1 10*3/uL (ref 4.0–10.5)

## 2016-01-14 LAB — GLUCOSE, CAPILLARY: Glucose-Capillary: 157 mg/dL — ABNORMAL HIGH (ref 65–99)

## 2016-01-14 LAB — BASIC METABOLIC PANEL
Anion gap: 10 (ref 5–15)
BUN: 14 mg/dL (ref 6–20)
CHLORIDE: 102 mmol/L (ref 101–111)
CO2: 28 mmol/L (ref 22–32)
Calcium: 9.3 mg/dL (ref 8.9–10.3)
Creatinine, Ser: 1.17 mg/dL (ref 0.61–1.24)
GFR calc non Af Amer: >60 mL/min (ref >60)
Glucose, Bld: 170 mg/dL — ABNORMAL HIGH (ref 65–99)
POTASSIUM: 4.3 mmol/L (ref 3.5–5.1)
SODIUM: 140 mmol/L (ref 135–145)

## 2016-01-14 LAB — SURGICAL PCR SCREEN
MRSA, PCR: NEGATIVE
Staphylococcus aureus: NEGATIVE

## 2016-01-14 NOTE — Progress Notes (Addendum)
Anesthesia Chart Review:  Pt is a 55 year old male scheduled for L2-3 microdiscectomy, R L2-3 extraforaminal with Met-RX on 01/22/2016 with Dr. Ellene Route.   Cardiologist is Dr. Elby Beck (care everywhere), last office visit 02/27/15, follow up recommended in 1 year.   PMH includes:  Nonischemic cardiomyopathy, CAD (by notes, diffuse irregularities by 2006 cath), MI, HTN, DM, OSA, hyperlipidemia, GERD. Former smoker. BMI 42  Medications include: ASA, carvedilol, cosopt ophthalmic, lasix, glipizide, humalog, lisinopril-hctz, metformin, pravastatin, zantac, sitagliptin, verapamil.   Preoperative labs reviewed.  Glucose 170. HgbA1c 8.2.   EKG 01/14/16: NSR. Minimal voltage criteria for LVH, may be normal variant. Nonspecific T wave abnormality  Nuclear stress test 11/27/14 (care everywhere):  Marland Kitchen No unexpected findings on raw images . No transient ischemic dilatation. . No fixed or transient perfusion defects at stress or rest. . On SPECT . Unable to calculate ejection fraction because study was not gated.  Echo 10/07/11 (care everywhere):  - The left ventricle is mildly dilated. - There is mild concentric left ventricular hypertrophy. - Overall left ventricular function appears to be mildly reduced. - LVEF=45-50% - The right ventricle is normal in size and function. - No significant valvular regurgitation. - The left atrium is moderately dilated. - Left ventricular filling pattern is pseudonormal.  If no changes, I anticipate pt can proceed with surgery as scheduled.   Willeen Cass, FNP-BC [facility]esthesiology Phone: [phone] 01/14/2016 4:30 PM

## 2016-01-14 NOTE — Progress Notes (Signed)
PCP is Dr. Olena Leatherwood Cardiologist is Dr. Carleene Cooper in [place] [employer] encourged pt to bring in his mask on the day of surgery-voices understanding. [facility] study done at [facility]- copy on the chart 03-31-14 Reports his fasting cbg's run 110-157 Reports a heart cath in 2006 Echo noted from 2012 stress test noted from 2016 in care everywhere. Pt also brought copies of his records-placed in chart

## 2016-01-15 LAB — HEMOGLOBIN A1C
Hgb A1c MFr Bld: 8.2 % — ABNORMAL HIGH (ref 4.8–5.6)
MEAN PLASMA GLUCOSE: 189 mg/dL

## 2016-01-21 MED ORDER — DEXTROSE 5 % IV SOLN
3.0000 g | INTRAVENOUS | Status: AC
  Administered 2016-01-22: 3 g via INTRAVENOUS
  Filled 2016-01-21: qty 3000

## 2016-01-22 ENCOUNTER — Ambulatory Visit (HOSPITAL_COMMUNITY): Payer: Worker's Compensation

## 2016-01-22 ENCOUNTER — Ambulatory Visit (HOSPITAL_COMMUNITY): Payer: Worker's Compensation | Admitting: Emergency Medicine

## 2016-01-22 ENCOUNTER — Observation Stay (HOSPITAL_COMMUNITY)
Admission: RE | Admit: 2016-01-22 | Discharge: 2016-01-24 | Disposition: A | Discharge status: Skilled Nursing Facility | Payer: Worker's Compensation | Source: Ambulatory Visit | Attending: Neurological Surgery | Admitting: Neurological Surgery

## 2016-01-22 ENCOUNTER — Encounter (HOSPITAL_COMMUNITY): Payer: Self-pay | Admitting: *Deleted

## 2016-01-22 ENCOUNTER — Encounter (HOSPITAL_COMMUNITY)
Admission: RE | Disposition: A | Discharge status: Skilled Nursing Facility | Payer: Self-pay | Source: Ambulatory Visit | Attending: Neurological Surgery

## 2016-01-22 ENCOUNTER — Ambulatory Visit (HOSPITAL_COMMUNITY): Payer: Worker's Compensation | Admitting: Certified Registered Nurse Anesthetist

## 2016-01-22 DIAGNOSIS — M5116 Intervertebral disc disorders with radiculopathy, lumbar region: Secondary | ICD-10-CM | POA: Diagnosis present

## 2016-01-22 DIAGNOSIS — Z87891 Personal history of nicotine dependence: Secondary | ICD-10-CM | POA: Insufficient documentation

## 2016-01-22 DIAGNOSIS — I10 Essential (primary) hypertension: Secondary | ICD-10-CM | POA: Diagnosis not present

## 2016-01-22 DIAGNOSIS — E119 Type 2 diabetes mellitus without complications: Secondary | ICD-10-CM | POA: Insufficient documentation

## 2016-01-22 DIAGNOSIS — I2510 Atherosclerotic heart disease of native coronary artery without angina pectoris: Secondary | ICD-10-CM | POA: Diagnosis not present

## 2016-01-22 DIAGNOSIS — M5126 Other intervertebral disc displacement, lumbar region: Secondary | ICD-10-CM | POA: Diagnosis present

## 2016-01-22 HISTORY — PX: LUMBAR LAMINECTOMY/ DECOMPRESSION WITH MET-RX: SHX5959

## 2016-01-22 LAB — GLUCOSE, CAPILLARY
Glucose-Capillary: 103 mg/dL — ABNORMAL HIGH (ref 65–99)
Glucose-Capillary: 114 mg/dL — ABNORMAL HIGH (ref 65–99)
Glucose-Capillary: 114 mg/dL — ABNORMAL HIGH (ref 65–99)
Glucose-Capillary: 195 mg/dL — ABNORMAL HIGH (ref 65–99)

## 2016-01-22 SURGERY — LUMBAR LAMINECTOMY/ DECOMPRESSION WITH MET-RX
Anesthesia: General | Site: Spine Lumbar | Laterality: Bilateral

## 2016-01-22 MED ORDER — ROCURONIUM BROMIDE 100 MG/10ML IV SOLN
INTRAVENOUS | Status: DC | PRN
Start: 2016-01-22 — End: 2016-01-22
  Administered 2016-01-22: 50 mg via INTRAVENOUS
  Administered 2016-01-22 (×2): 10 mg via INTRAVENOUS
  Administered 2016-01-22: 20 mg via INTRAVENOUS
  Administered 2016-01-22: 10 mg via INTRAVENOUS

## 2016-01-22 MED ORDER — CEFAZOLIN SODIUM 1-5 GM-% IV SOLN
1.0000 g | Freq: Three times a day (TID) | INTRAVENOUS | Status: AC
  Administered 2016-01-22 – 2016-01-23 (×2): 1 g via INTRAVENOUS
  Filled 2016-01-22 (×2): qty 50

## 2016-01-22 MED ORDER — METFORMIN HCL 500 MG PO TABS
1000.0000 mg | ORAL_TABLET | Freq: Two times a day (BID) | ORAL | Status: DC
  Administered 2016-01-23 – 2016-01-24 (×3): 1000 mg via ORAL
  Filled 2016-01-22 (×3): qty 2

## 2016-01-22 MED ORDER — VERAPAMIL HCL ER 240 MG PO TBCR
240.0000 mg | EXTENDED_RELEASE_TABLET | Freq: Every day | ORAL | Status: DC
  Administered 2016-01-23 – 2016-01-24 (×2): 240 mg via ORAL
  Filled 2016-01-22 (×2): qty 1

## 2016-01-22 MED ORDER — ONDANSETRON HCL 4 MG/2ML IJ SOLN: 4.0000 mg | Freq: Four times a day (QID) | INTRAMUSCULAR | Status: DC | PRN

## 2016-01-22 MED ORDER — SUGAMMADEX SODIUM 500 MG/5ML IV SOLN
INTRAVENOUS | Status: DC | PRN
  Administered 2016-01-22: 275.2 mg via INTRAVENOUS

## 2016-01-22 MED ORDER — HYDROMORPHONE HCL 1 MG/ML IJ SOLN: 0.2500 mg | INTRAMUSCULAR | Status: DC | PRN

## 2016-01-22 MED ORDER — DIAZEPAM 5 MG PO TABS
5.0000 mg | ORAL_TABLET | Freq: Four times a day (QID) | ORAL | Status: DC | PRN
  Administered 2016-01-22 – 2016-01-24 (×3): 5 mg via ORAL
  Filled 2016-01-22 (×3): qty 1

## 2016-01-22 MED ORDER — SODIUM CHLORIDE 0.9% FLUSH: 3.0000 mL | INTRAVENOUS | Status: DC | PRN

## 2016-01-22 MED ORDER — PRAVASTATIN SODIUM 40 MG PO TABS
40.0000 mg | ORAL_TABLET | Freq: Every day | ORAL | Status: DC
  Administered 2016-01-23: 40 mg via ORAL
  Filled 2016-01-22: qty 1

## 2016-01-22 MED ORDER — PROPOFOL 10 MG/ML IV BOLUS
INTRAVENOUS | Status: DC | PRN
  Administered 2016-01-22: 200 mg via INTRAVENOUS

## 2016-01-22 MED ORDER — GLIPIZIDE ER 10 MG PO TB24
10.0000 mg | ORAL_TABLET | Freq: Every day | ORAL | Status: DC
  Administered 2016-01-23 – 2016-01-24 (×2): 10 mg via ORAL
  Filled 2016-01-22 (×2): qty 1

## 2016-01-22 MED ORDER — ONDANSETRON HCL 4 MG/2ML IJ SOLN
INTRAMUSCULAR | Status: DC | PRN
  Administered 2016-01-22: 4 mg via INTRAVENOUS

## 2016-01-22 MED ORDER — POLYETHYLENE GLYCOL 3350 17 G PO PACK: 17.0000 g | PACK | Freq: Every day | ORAL | Status: DC | PRN

## 2016-01-22 MED ORDER — ACETAMINOPHEN 650 MG RE SUPP: 650.0000 mg | RECTAL | Status: DC | PRN

## 2016-01-22 MED ORDER — LIDOCAINE HCL (CARDIAC) 20 MG/ML IV SOLN
INTRAVENOUS | Status: DC | PRN
Start: 2016-01-22 — End: 2016-01-22
  Administered 2016-01-22: 100 mg via INTRAVENOUS

## 2016-01-22 MED ORDER — FENTANYL CITRATE (PF) 100 MCG/2ML IJ SOLN
INTRAMUSCULAR | Status: DC | PRN
  Administered 2016-01-22: 50 ug via INTRAVENOUS
  Administered 2016-01-22: 100 ug via INTRAVENOUS
  Administered 2016-01-22: 50 ug via INTRAVENOUS
  Administered 2016-01-22: 100 ug via INTRAVENOUS

## 2016-01-22 MED ORDER — THROMBIN 5000 UNITS EX SOLR
CUTANEOUS | Status: DC | PRN
  Administered 2016-01-22 (×2): 5000 [IU] via TOPICAL

## 2016-01-22 MED ORDER — PROCHLORPERAZINE EDISYLATE 5 MG/ML IJ SOLN
10.0000 mg | Freq: Once | INTRAMUSCULAR | Status: AC
  Administered 2016-01-22: 10 mg via INTRAVENOUS

## 2016-01-22 MED ORDER — ACETAMINOPHEN 325 MG PO TABS: 650.0000 mg | ORAL_TABLET | ORAL | Status: DC | PRN

## 2016-01-22 MED ORDER — BISACODYL 10 MG RE SUPP
10.0000 mg | Freq: Every day | RECTAL | Status: DC | PRN
Start: 2016-01-22 — End: 2016-01-24

## 2016-01-22 MED ORDER — ALUM & MAG HYDROXIDE-SIMETH 200-200-20 MG/5ML PO SUSP: 30.0000 mL | Freq: Four times a day (QID) | ORAL | Status: DC | PRN

## 2016-01-22 MED ORDER — HYDROCHLOROTHIAZIDE 25 MG PO TABS
25.0000 mg | ORAL_TABLET | Freq: Every day | ORAL | Status: DC
  Administered 2016-01-23 – 2016-01-24 (×2): 25 mg via ORAL
  Filled 2016-01-22 (×2): qty 1

## 2016-01-22 MED ORDER — FENTANYL CITRATE (PF) 250 MCG/5ML IJ SOLN
INTRAMUSCULAR | Status: AC
  Filled 2016-01-22: qty 5

## 2016-01-22 MED ORDER — PROPOFOL 10 MG/ML IV BOLUS
INTRAVENOUS | Status: AC
  Filled 2016-01-22: qty 20

## 2016-01-22 MED ORDER — LIDOCAINE 2% (20 MG/ML) 5 ML SYRINGE
INTRAMUSCULAR | Status: AC
  Filled 2016-01-22: qty 5

## 2016-01-22 MED ORDER — LISINOPRIL-HYDROCHLOROTHIAZIDE 20-25 MG PO TABS: 1.0000 | ORAL_TABLET | Freq: Every day | ORAL | Status: DC

## 2016-01-22 MED ORDER — SUCCINYLCHOLINE CHLORIDE 20 MG/ML IJ SOLN
INTRAMUSCULAR | Status: DC | PRN
  Administered 2016-01-22: 120 mg via INTRAVENOUS

## 2016-01-22 MED ORDER — ROCURONIUM BROMIDE 50 MG/5ML IV SOLN
INTRAVENOUS | Status: AC
  Filled 2016-01-22: qty 1

## 2016-01-22 MED ORDER — PHENOL 1.4 % MT LIQD: 1.0000 | OROMUCOSAL | Status: DC | PRN

## 2016-01-22 MED ORDER — FUROSEMIDE 20 MG PO TABS
20.0000 mg | ORAL_TABLET | Freq: Every day | ORAL | Status: DC
  Administered 2016-01-23 – 2016-01-24 (×2): 20 mg via ORAL
  Filled 2016-01-22 (×2): qty 1

## 2016-01-22 MED ORDER — SENNA 8.6 MG PO TABS
1.0000 | ORAL_TABLET | Freq: Two times a day (BID) | ORAL | Status: DC
  Administered 2016-01-22 – 2016-01-24 (×4): 8.6 mg via ORAL
  Filled 2016-01-22 (×4): qty 1

## 2016-01-22 MED ORDER — SODIUM CHLORIDE 0.9 % IV SOLN: 250.0000 mL | INTRAVENOUS | Status: DC

## 2016-01-22 MED ORDER — MORPHINE SULFATE (PF) 2 MG/ML IV SOLN
1.0000 mg | INTRAVENOUS | Status: DC | PRN
  Administered 2016-01-22: 4 mg via INTRAVENOUS
  Filled 2016-01-22: qty 2

## 2016-01-22 MED ORDER — GELATIN ABSORBABLE MT POWD
OROMUCOSAL | Status: DC | PRN
  Administered 2016-01-22: 18:00:00 via TOPICAL

## 2016-01-22 MED ORDER — ONDANSETRON HCL 4 MG/2ML IJ SOLN: 4.0000 mg | INTRAMUSCULAR | Status: DC | PRN

## 2016-01-22 MED ORDER — 0.9 % SODIUM CHLORIDE (POUR BTL) OPTIME
TOPICAL | Status: DC | PRN
  Administered 2016-01-22: 1000 mL

## 2016-01-22 MED ORDER — FAMOTIDINE 20 MG PO TABS
10.0000 mg | ORAL_TABLET | Freq: Two times a day (BID) | ORAL | Status: DC
  Administered 2016-01-22 – 2016-01-24 (×4): 10 mg via ORAL
  Filled 2016-01-22 (×4): qty 1

## 2016-01-22 MED ORDER — MIDAZOLAM HCL 5 MG/5ML IJ SOLN
INTRAMUSCULAR | Status: DC | PRN
  Administered 2016-01-22: 2 mg via INTRAVENOUS

## 2016-01-22 MED ORDER — MENTHOL 3 MG MT LOZG
1.0000 | LOZENGE | OROMUCOSAL | Status: DC | PRN
Start: 2016-01-22 — End: 2016-01-24

## 2016-01-22 MED ORDER — SODIUM CHLORIDE 0.9 % IR SOLN
Status: DC | PRN
  Administered 2016-01-22: 18:00:00

## 2016-01-22 MED ORDER — LACTATED RINGERS IV SOLN
INTRAVENOUS | Status: DC
  Administered 2016-01-22: 13:00:00 via INTRAVENOUS

## 2016-01-22 MED ORDER — DORZOLAMIDE HCL-TIMOLOL MAL 2-0.5 % OP SOLN
1.0000 [drp] | Freq: Two times a day (BID) | OPHTHALMIC | Status: DC
  Administered 2016-01-24: 1 [drp] via OPHTHALMIC
  Filled 2016-01-22: qty 10

## 2016-01-22 MED ORDER — SODIUM CHLORIDE 0.9% FLUSH: 3.0000 mL | Freq: Two times a day (BID) | INTRAVENOUS | Status: DC

## 2016-01-22 MED ORDER — LINAGLIPTIN 5 MG PO TABS
5.0000 mg | ORAL_TABLET | Freq: Every day | ORAL | Status: DC
  Administered 2016-01-23 – 2016-01-24 (×2): 5 mg via ORAL
  Filled 2016-01-22 (×2): qty 1

## 2016-01-22 MED ORDER — HYDROCODONE-ACETAMINOPHEN 5-325 MG PO TABS: 1.0000 | ORAL_TABLET | ORAL | Status: DC | PRN

## 2016-01-22 MED ORDER — TAMSULOSIN HCL 0.4 MG PO CAPS
0.4000 mg | ORAL_CAPSULE | Freq: Every day | ORAL | Status: DC
  Administered 2016-01-23 – 2016-01-24 (×2): 0.4 mg via ORAL
  Filled 2016-01-22 (×4): qty 1

## 2016-01-22 MED ORDER — ALLOPURINOL 300 MG PO TABS
300.0000 mg | ORAL_TABLET | Freq: Every day | ORAL | Status: DC
  Administered 2016-01-23 – 2016-01-24 (×2): 300 mg via ORAL
  Filled 2016-01-22 (×2): qty 1

## 2016-01-22 MED ORDER — PHENYLEPHRINE HCL 10 MG/ML IJ SOLN
10.0000 mg | INTRAVENOUS | Status: DC | PRN
  Administered 2016-01-22: 10 ug/min via INTRAVENOUS

## 2016-01-22 MED ORDER — HEMOSTATIC AGENTS (NO CHARGE) OPTIME
TOPICAL | Status: DC | PRN
  Administered 2016-01-22: 1 via TOPICAL

## 2016-01-22 MED ORDER — OXYCODONE HCL 5 MG PO TABS: 5.0000 mg | ORAL_TABLET | Freq: Once | ORAL | Status: DC | PRN

## 2016-01-22 MED ORDER — CARVEDILOL 6.25 MG PO TABS
12.5000 mg | ORAL_TABLET | Freq: Two times a day (BID) | ORAL | Status: DC
Start: 2016-01-23 — End: 2016-01-24
  Administered 2016-01-23 – 2016-01-24 (×3): 12.5 mg via ORAL
  Filled 2016-01-22 (×3): qty 2

## 2016-01-22 MED ORDER — GABAPENTIN 600 MG PO TABS
600.0000 mg | ORAL_TABLET | Freq: Two times a day (BID) | ORAL | Status: DC
  Administered 2016-01-22 – 2016-01-24 (×4): 600 mg via ORAL
  Filled 2016-01-22 (×4): qty 1

## 2016-01-22 MED ORDER — SUGAMMADEX SODIUM 500 MG/5ML IV SOLN
INTRAVENOUS | Status: AC
  Filled 2016-01-22: qty 5

## 2016-01-22 MED ORDER — ONDANSETRON HCL 4 MG/2ML IJ SOLN
INTRAMUSCULAR | Status: AC
  Filled 2016-01-22: qty 2

## 2016-01-22 MED ORDER — INSULIN ASPART 100 UNIT/ML ~~LOC~~ SOLN
0.0000 [IU] | Freq: Three times a day (TID) | SUBCUTANEOUS | Status: DC
  Administered 2016-01-23: 11 [IU] via SUBCUTANEOUS
  Administered 2016-01-23 – 2016-01-24 (×4): 7 [IU] via SUBCUTANEOUS

## 2016-01-22 MED ORDER — LISINOPRIL 20 MG PO TABS
20.0000 mg | ORAL_TABLET | Freq: Every day | ORAL | Status: DC
  Administered 2016-01-23 – 2016-01-24 (×2): 20 mg via ORAL
  Filled 2016-01-22 (×2): qty 1

## 2016-01-22 MED ORDER — MIDAZOLAM HCL 2 MG/2ML IJ SOLN
INTRAMUSCULAR | Status: AC
  Filled 2016-01-22: qty 2

## 2016-01-22 MED ORDER — VECURONIUM BROMIDE 10 MG IV SOLR
INTRAVENOUS | Status: DC | PRN
  Administered 2016-01-22: 2 mg via INTRAVENOUS

## 2016-01-22 MED ORDER — KETOROLAC TROMETHAMINE 15 MG/ML IJ SOLN
15.0000 mg | Freq: Four times a day (QID) | INTRAMUSCULAR | Status: AC
  Administered 2016-01-22 – 2016-01-23 (×3): 15 mg via INTRAVENOUS
  Filled 2016-01-22 (×2): qty 1

## 2016-01-22 MED ORDER — PHENYLEPHRINE HCL 10 MG/ML IJ SOLN
INTRAMUSCULAR | Status: DC | PRN
  Administered 2016-01-22: 80 ug via INTRAVENOUS
  Administered 2016-01-22: 120 ug via INTRAVENOUS

## 2016-01-22 MED ORDER — LIDOCAINE-EPINEPHRINE 1 %-1:100000 IJ SOLN
INTRAMUSCULAR | Status: DC | PRN
  Administered 2016-01-22 (×2): 2.5 mL

## 2016-01-22 MED ORDER — DOCUSATE SODIUM 100 MG PO CAPS
100.0000 mg | ORAL_CAPSULE | Freq: Two times a day (BID) | ORAL | Status: DC
  Administered 2016-01-22 – 2016-01-24 (×4): 100 mg via ORAL
  Filled 2016-01-22 (×4): qty 1

## 2016-01-22 MED ORDER — OXYCODONE-ACETAMINOPHEN 5-325 MG PO TABS
1.0000 | ORAL_TABLET | ORAL | Status: DC | PRN
  Administered 2016-01-22 – 2016-01-24 (×8): 2 via ORAL
  Filled 2016-01-22 (×8): qty 2

## 2016-01-22 MED ORDER — PROCHLORPERAZINE EDISYLATE 5 MG/ML IJ SOLN
INTRAMUSCULAR | Status: AC
  Filled 2016-01-22: qty 2

## 2016-01-22 MED ORDER — ALBUTEROL SULFATE HFA 108 (90 BASE) MCG/ACT IN AERS
INHALATION_SPRAY | RESPIRATORY_TRACT | Status: AC
  Filled 2016-01-22: qty 13.4

## 2016-01-22 MED ORDER — BUPIVACAINE HCL (PF) 0.5 % IJ SOLN
INTRAMUSCULAR | Status: DC | PRN
  Administered 2016-01-22: 10 mL
  Administered 2016-01-22: 5 mL
  Administered 2016-01-22 (×2): 2.5 mL

## 2016-01-22 MED ORDER — OXYCODONE HCL 5 MG/5ML PO SOLN: 5.0000 mg | Freq: Once | ORAL | Status: DC | PRN

## 2016-01-22 SURGICAL SUPPLY — 50 items
BAG DECANTER FOR FLEXI CONT (MISCELLANEOUS) ×3 IMPLANT
BLADE CLIPPER SURG (BLADE) IMPLANT
BLADE SURG 15 STRL LF DISP TIS (BLADE) ×1 IMPLANT
BLADE SURG 15 STRL SS (BLADE) ×2
BUR 2.5 MTCH HD 16 (BUR) ×2 IMPLANT
BUR 2.5MM MTCH HD 16CM (BUR) ×1
CANISTER SUCT 3000ML PPV (MISCELLANEOUS) ×3 IMPLANT
DECANTER SPIKE VIAL GLASS SM (MISCELLANEOUS) IMPLANT
DERMABOND ADVANCED (GAUZE/BANDAGES/DRESSINGS) ×2
DERMABOND ADVANCED .7 DNX12 (GAUZE/BANDAGES/DRESSINGS) ×1 IMPLANT
DEVICE DISSECT PLASMABLAD 3.0S (MISCELLANEOUS) ×1 IMPLANT
DRAPE C-ARM 42X72 X-RAY (DRAPES) ×6 IMPLANT
DRAPE C-ARMOR (DRAPES) ×3 IMPLANT
DRAPE LAPAROTOMY 100X72 PEDS (DRAPES) ×3 IMPLANT
DRAPE MICROSCOPE LEICA (MISCELLANEOUS) ×3 IMPLANT
DRAPE POUCH INSTRU U-SHP 10X18 (DRAPES) ×3 IMPLANT
DURAPREP 6ML APPLICATOR 50/CS (WOUND CARE) ×3 IMPLANT
ELECT BLADE 6.5 EXT (BLADE) ×3 IMPLANT
ELECT REM PT RETURN 9FT ADLT (ELECTROSURGICAL) ×3
ELECTRODE REM PT RTRN 9FT ADLT (ELECTROSURGICAL) ×1 IMPLANT
GAUZE SPONGE 4X4 16PLY XRAY LF (GAUZE/BANDAGES/DRESSINGS) ×3 IMPLANT
GLOVE BIO SURGEON STRL SZ7 (GLOVE) ×9 IMPLANT
GLOVE BIOGEL PI IND STRL 7.0 (GLOVE) ×1 IMPLANT
GLOVE BIOGEL PI IND STRL 7.5 (GLOVE) ×2 IMPLANT
GLOVE BIOGEL PI IND STRL 8.5 (GLOVE) ×2 IMPLANT
GLOVE BIOGEL PI INDICATOR 7.0 (GLOVE) ×2
GLOVE BIOGEL PI INDICATOR 7.5 (GLOVE) ×4
GLOVE BIOGEL PI INDICATOR 8.5 (GLOVE) ×4
GLOVE ECLIPSE 8.5 STRL (GLOVE) ×6 IMPLANT
GOWN STRL REUS W/ TWL LRG LVL3 (GOWN DISPOSABLE) ×1 IMPLANT
GOWN STRL REUS W/ TWL XL LVL3 (GOWN DISPOSABLE) IMPLANT
GOWN STRL REUS W/TWL 2XL LVL3 (GOWN DISPOSABLE) ×6 IMPLANT
GOWN STRL REUS W/TWL LRG LVL3 (GOWN DISPOSABLE) ×2
GOWN STRL REUS W/TWL XL LVL3 (GOWN DISPOSABLE)
KIT BASIN OR (CUSTOM PROCEDURE TRAY) ×3 IMPLANT
KIT ROOM TURNOVER OR (KITS) ×3 IMPLANT
NEEDLE HYPO 18GX1.5 BLUNT FILL (NEEDLE) IMPLANT
NEEDLE HYPO 22GX1.5 SAFETY (NEEDLE) ×3 IMPLANT
NEEDLE SPNL 20GX3.5 QUINCKE YW (NEEDLE) ×3 IMPLANT
NS IRRIG 1000ML POUR BTL (IV SOLUTION) ×3 IMPLANT
PACK LAMINECTOMY NEURO (CUSTOM PROCEDURE TRAY) ×3 IMPLANT
PAD ARMBOARD 7.5X6 YLW CONV (MISCELLANEOUS) ×9 IMPLANT
PLASMABLADE 3.0S (MISCELLANEOUS) ×3
RUBBERBAND STERILE (MISCELLANEOUS) ×6 IMPLANT
SPONGE SURGIFOAM ABS GEL SZ50 (HEMOSTASIS) ×3 IMPLANT
SUT VIC AB 3-0 SH 8-18 (SUTURE) ×6 IMPLANT
SYR 5ML LL (SYRINGE) IMPLANT
TOWEL OR 17X24 6PK STRL BLUE (TOWEL DISPOSABLE) ×3 IMPLANT
TOWEL OR 17X26 10 PK STRL BLUE (TOWEL DISPOSABLE) ×3 IMPLANT
WATER STERILE IRR 1000ML POUR (IV SOLUTION) ×3 IMPLANT

## 2016-01-22 NOTE — Transfer of Care (Signed)
Immediate Anesthesia Transfer of Care Note  Patient: Michael Vaughn  Procedure(s) Performed: Procedure(s) with comments: Left Lumbar Two-THree Microdiskectomy and Right Lumbar Two-Three Extraforaminal with Met-RX (Bilateral) - Left L2-3 Microdiskectomy/Right L2-3 Extraforaminal with Met-RX  Patient Location: PACU  Anesthesia Type:General  Level of Consciousness: awake, alert  and oriented  Airway & Oxygen Therapy: Patient Spontanous Breathing and Patient connected to nasal cannula oxygen  Post-op Assessment: Report given to RN, Post -op Vital signs reviewed and stable and Patient moving all extremities  Post vital signs: Reviewed and stable  Last Vitals:  Filed Vitals:   01/22/16 1222 01/22/16 1926  BP: 154/91 150/95  Pulse: 78 70  Temp: 37.1 C 36.7 C  Resp: 18 20    Last Pain:  Filed Vitals:   01/22/16 1927  PainSc: 4       Patients Stated Pain Goal: 3 (82/70/78 6754)  Complications: No apparent anesthesia complications

## 2016-01-22 NOTE — H&P (Signed)
HISTORY OF PRESENT ILLNESS:                     Michael Vaughn presents to the office today.  I have treated him a number of years ago for herniated nucleus pulposus at L4-L5.  Michael Vaughn has had chronic back problems and he has been out of work because of that for a long period of time.  He tells me that about mid January he had an episode where he suddenly bent over and felt a pain in his back such that he could hardly straighten up and he could hardly bear any weight on his legs but he could hardly sit either and there was no getting comfortable for a period of time.  On January 30 he underwent an MRI of his lumbar spine which demonstrated the presence of a large central and left-sided disc herniation at the level of L2-3.  On the right side in the extraforaminal zone there was a fragment of disc under the L3 nerve root on the right side.  Michael Vaughn has been dealing with this process and he notes that things have gotten better but he notes that his right leg tends to give way unexpectedly and he has been walking with a set of crutches because of that.  PAST MEDICAL HISTORY:                                Michael Vaughn that he has had some recent weight gain because of his inactivity and currently he is weighing just over 300 pounds.  He had been losing weight steadily after colonoscopy a few years ago when his doctor told he needed to lose some weight.  PHYSICAL EXAMINATION:                                I note that he stands straight and erect; however, he does tend to place his weight on his left leg as he is hesitant to maintain or bear much weight on the right leg.  If he does bear weight, he has good strength in the iliopsoas but the quad strength seems to be weakened such that he has difficulty raising up on to his right leg compared to his left leg where he steps up on to a stool with ease.  His tibialis anterior and gastrocs strength appears intact.  Deep tendon reflexes are absent in the patellae and the Achilles both  and straight leg raising is positive on the right side at 15 degrees, also positive on the left side at 30 degrees.  Patrick's maneuver is negative bilaterally.  IMPRESSION:                                                   I am troubled by Michael Vaughn's weakness in the right leg particularly for a fairly sizeable muscle.  Given the fact that this is a large central disc herniation with compression of the spinal canal and resultant stenosis I have advised Michael Vaughn that he should undergo surgical decompression of the disc on the left side at L2-3.  I also believe that he should have an extraforaminal discectomy on the right side at L2-3 to decompress the traversing L2 nerve  root which I believe is likely causing a good deal of his weakness in the right side.  This could be done as one surgery using a METRx tube dissection.  It is a significant operation but I hope that we can do it on an outpatient basis at the surgical center and get Michael Vaughn home on the same day.  Michael Vaughn is diabetic and he is insulin dependent and that may make his surgery more safely done at the hospital where he could have medicine coverage for control of his diabetes postoperatively.  We will see what we can arrange in that effort but still like to try to get his surgery done as expeditiously as possible.

## 2016-01-22 NOTE — Anesthesia Postprocedure Evaluation (Signed)
Anesthesia Post Note  Patient: Michael Vaughn  Procedure(s) Performed: Procedure(s) (LRB): Left Lumbar Two-THree Microdiskectomy and Right Lumbar Two-Three Extraforaminal with Met-RX (Bilateral)  Patient location during evaluation: PACU Anesthesia Type: General Level of consciousness: awake and alert Pain management: pain level controlled Vital Signs Assessment: post-procedure vital signs reviewed and stable Respiratory status: spontaneous breathing, nonlabored ventilation and respiratory function stable Cardiovascular status: blood pressure returned to baseline and stable Postop Assessment: no signs of nausea or vomiting Anesthetic complications: no    Last Vitals:  Filed Vitals:   01/22/16 1946 01/22/16 2000  BP: 130/78 139/79  Pulse: 71 71  Temp:    Resp: 23 21    Last Pain:  Filed Vitals:   01/22/16 2001  PainSc: Asleep                 Mikaelah Trostle A

## 2016-01-22 NOTE — Progress Notes (Signed)
Patient ID: Michael Vaughn, male   DOB: 14-Sep-1961, 55 y.o.   MRN: [id number] Nausea postoperatively Motor function appears intact Patient is doing well overall Leg strength appears intact

## 2016-01-22 NOTE — Anesthesia Procedure Notes (Signed)
Procedure Name: Intubation Date/Time: 01/22/2016 4:17 PM Performed by: Adonis Housekeeper Pre-anesthesia Checklist: Patient identified, Emergency Drugs available, Suction available and Patient being monitored Patient Re-evaluated:Patient Re-evaluated prior to inductionOxygen Delivery Method: Circle system utilized Preoxygenation: Pre-oxygenation with 100% oxygen Intubation Type: IV induction Ventilation: Mask ventilation without difficulty Laryngoscope Size: Glidescope and 4 Grade View: Grade I Tube type: Oral Tube size: 7.5 mm Number of attempts: 1 Airway Equipment and Method: Stylet and Video-laryngoscopy Placement Confirmation: ETT inserted through vocal cords under direct vision,  positive ETCO2 and breath sounds checked- equal and bilateral Secured at: 22 cm Tube secured with: Tape Dental Injury: Teeth and Oropharynx as per pre-operative assessment

## 2016-01-22 NOTE — Op Note (Signed)
Date of surgery: 01/22/2016 Preoperative diagnosis: Herniated nucleus pulposus L2-3 left with left lumbar radiculopathy and L2-3 right extraforaminal with right L2 radiculopathy Postoperative diagnosis: Same Procedure: Right-sided extraforaminal discectomy L2-3 with Metrix retractor operating microscope microdissection technique. Left-sided microdiscectomy L2-3 with operating microscope, Metrix retractor system Surgeon: Barnett Abu Anesthesia: Gen. endotracheal Indications: Michael Vaughn is a 55 year old individual whom I've seen and treated in the past is developed right-sided lumbar radiculopathy and this is felt to be secondary to an extraforaminal disc protrusion at L2-3 however his noted days had some onset of left-sided leg pain and it was noted that he has a large left sided disc herniation inside the canal compressing the path of the L3 nerve root. After careful consideration of his options I advised surgical decompression the laminotomy and discectomy on the left side at L2-3 and extraforaminal microdiscectomy using a Metrix retractor on the left side. He is now taken to the operating room for that procedure  Procedure: Patient was brought to the operating room supine on a stretcher. After the smooth induction of general endotracheal anesthesia, he was turned prone. The back was prepped without wall DuraPrep and draped in a sterile fashion then using fluoroscopic guidance the extraforaminal zone at L2-3 on the left was identified. A small vertical incision was created and this was then punctured with a K wire down to the extraforaminal zone at L2-3 on the left a 15 blade was passed alongside K wire to aid in dissection a series of dilators was then passed down to this area and area was dilated to 22 mm diameter. An 8 cm tube retractor was then placed in this region and the inner annulus were removed. Then by dissecting carefully the extraforaminal zone was identified and the path of the L2 nerve root  was identified as noted to be both dorsally and felt rather tight by dissecting just underneath the nerve root is noted that there was a significant mass bulging from a thin the posterior longitudinal ligament this was incised and under modest pressure several fragments of disc material were removed further dissection yielded only the ligament which was now loose. Portions of the ligament were resected the disc space was also entered and several fragments of disc material from within the disc space were also removed. Once the discectomy was completed and was noted that the path of the L2 nerve root was well decompressed. Hemostasis was carefully obtained using bipolar cautery and small pledgets of this Surgifoam and cottonoid padding the cottonoids were removed the area was irrigated copiously and then the tube retractor was removed and the fascia was closed with 3-0 Vicryl interrupted fashion and 3-0 Vicryl subcuticular skin.  With the patient still asleep within identified the interlaminar site at L2-3 on the left a K wire was passed this region a small vertical incision was made overlying this region a series of dilators was then passed over the K wire to allow placement of an 8 cm long 22 mm wide Metrix tube retractor through this aperture then a laminotomy was created removing the superior most portion lamina of L3 as the disc fragment was behind the vertebral body of L3 inferior portion lamina of L2 was also removed and part of the mesial facetectomy was performed here the ligament was taken up and the lateral edge of the dura was identified by gently retracting the dura medially significant mass was uncovered this required further dissection to ultimately yielded several large fragments of disc material which were removed in  a piecemeal fashion these have become partially adherent and attached such that there is there for a period of time once the fragments were removed no other fragments were identified  dissection was carried cephalad towards the region of the disc space but no rent in the ligament could be identified. With this hemostasis was achieved the retractors were removed the fascia was closed with 3-0 Vicryl and 3-0 Vicryl was used in subcutaneous take her tissues Dermabond was placed on the skin blood loss for the procedure was estimated at 400 mL

## 2016-01-22 NOTE — Anesthesia Preprocedure Evaluation (Addendum)
Anesthesia Evaluation  Patient identified by MRN, date of birth, ID band Patient awake    Reviewed: Allergy & Precautions, NPO status , Patient's Chart, lab work & pertinent test results  Airway Mallampati: II   Neck ROM: full    Dental   Pulmonary shortness of breath, sleep apnea , former smoker,    breath sounds clear to auscultation       Cardiovascular hypertension, + angina + CAD  + dysrhythmias  Rhythm:regular Rate:Normal     Neuro/Psych  Headaches,    GI/Hepatic Neg liver ROS, hiatal hernia, GERD  ,  Endo/Other  diabetes, Type obesity  Renal/GU negative Renal ROS     Musculoskeletal   Abdominal   Peds  Hematology   Anesthesia Other Findings   Reproductive/Obstetrics                           Anesthesia Physical Anesthesia Plan  ASA: III  Anesthesia Plan: General   Post-op Pain Management:    Induction: Intravenous  Airway Management Planned: Oral ETT  Additional Equipment:   Intra-op Plan:   Post-operative Plan: Extubation in OR  Informed Consent: I have reviewed the patients History and Physical, chart, labs and discussed the procedure including the risks, benefits and alternatives for the proposed anesthesia with the patient or authorized representative who has indicated his/her understanding and acceptance.   Dental advisory given  Plan Discussed with: CRNA, Anesthesiologist and Surgeon  Anesthesia Plan Comments:        Anesthesia Quick Evaluation

## 2016-01-23 DIAGNOSIS — M5116 Intervertebral disc disorders with radiculopathy, lumbar region: Secondary | ICD-10-CM | POA: Diagnosis not present

## 2016-01-23 LAB — GLUCOSE, CAPILLARY
GLUCOSE-CAPILLARY: 203 mg/dL — AB (ref 65–99)
GLUCOSE-CAPILLARY: 199 mg/dL — AB (ref 65–99)
Glucose-Capillary: 336 mg/dL — ABNORMAL HIGH (ref 65–99)
Glucose-Capillary: 300 mg/dL — ABNORMAL HIGH (ref 65–99)
Glucose-Capillary: 240 mg/dL — ABNORMAL HIGH (ref 65–99)

## 2016-01-23 NOTE — Evaluation (Signed)
Physical Therapy Evaluation Patient Details Name: Michael Vaughn MRN: [id number] DOB: [birth year] Today's Date: 01/23/2016   History of Present Illness  55 yo male s/p L 2-3 microdisckectomy with R L2-3 extraforminal with met-rd bilateral. PMH: CAD, MI HTN DM OSA GERD hyperlipidemia hx R LE pain / weakness  Clinical Impression  Pt admitted with above diagnosis. Pt currently with functional limitations due to the deficits listed below (see PT Problem List). At the time of PT eval pt was able to perform transfers and ambulation with min guard to min assist for functional mobility. Pt will benefit from skilled PT to increase their independence and safety with mobility to allow discharge to the venue listed below.       Follow Up Recommendations Home health PT    Equipment Recommendations  3in1 (PT)    Recommendations for Other Services       Precautions / Restrictions Precautions Precautions: Back Precaution Booklet Issued: Yes (comment) Precaution Comments: Reviewed precautions during functional mobility. Handout present in room from OT.  Restrictions Weight Bearing Restrictions: No      Mobility  Bed Mobility Overal bed mobility: Needs Assistance Bed Mobility: Rolling;Sit to Sidelying Rolling: Supervision       Sit to sidelying: Min assist General bed mobility comments: Assist to elevate LE's back into bed.   Transfers Overall transfer level: Needs assistance Equipment used: Rolling walker (2 wheeled) Transfers: Sit to/from Stand Sit to Stand: Min assist         General transfer comment: VC's for hand placement on seated surface for safety. Min assist to power-up to full standing position with increased time.   Ambulation/Gait Ambulation/Gait assistance: Min guard Ambulation Distance (Feet): 150 Feet Assistive device: Rolling walker (2 wheeled) Gait Pattern/deviations: Step-through pattern;Decreased stride length;Trunk flexed Gait velocity: Decreased Gait velocity  interpretation: Below normal speed for age/gender General Gait Details: Slow but fairly steady. No knee buckling noted and no LOB. Pt moving overall slow.   Stairs            Wheelchair Mobility    Modified Rankin (Stroke Patients Only)       Balance Overall balance assessment: Needs assistance Sitting-balance support: Feet supported;No upper extremity supported Sitting balance-Leahy Scale: Fair     Standing balance support: Single extremity supported;During functional activity Standing balance-Leahy Scale: Poor Standing balance comment: Pt reaching for foot board of bed for support when walking around the bed.                             Pertinent Vitals/Pain Pain Assessment: Faces Faces Pain Scale: Hurts little more Pain Location: Incision  Pain Descriptors / Indicators: Operative site guarding Pain Intervention(s): Limited activity within patient's tolerance;Monitored during session;Repositioned    Home Living Family/patient expects to be discharged to:: Private residence Living Arrangements: Alone Available Help at Discharge: Friend(s);Available PRN/intermittently (friends work 12 hour shifts) Type of Home: House Home Access: Stairs to enter Entrance Stairs-Rails: None Technical brewer of Steps: 2 Home Layout: One level Home Equipment: Environmental consultant - 4 wheels;Crutches      Prior Function Level of Independence: Independent with assistive device(s)         Comments: ambulated with crutches but has rollator if needeed     Hand Dominance   Dominant Hand: Right    Extremity/Trunk Assessment   Upper Extremity Assessment: Defer to OT evaluation           Lower Extremity Assessment: Generalized weakness  LLE Deficits / Details: OT noted knee buckling during session - none noted during PT mobility.   Cervical / Trunk Assessment: Other exceptions (body habitus rounded shoulders)  Communication   Communication: No difficulties   Cognition Arousal/Alertness: Awake/alert Behavior During Therapy: WFL for tasks assessed/performed Overall Cognitive Status: Within Functional Limits for tasks assessed                      General Comments      Exercises        Assessment/Plan    PT Assessment Patient needs continued PT services  PT Diagnosis Difficulty walking;Acute pain   PT Problem List Decreased strength;Decreased range of motion;Decreased activity tolerance;Decreased balance;Decreased mobility;Decreased safety awareness;Decreased knowledge of use of DME;Decreased knowledge of precautions;Pain  PT Treatment Interventions DME instruction;Gait training;Stair training;Functional mobility training;Therapeutic activities;Therapeutic exercise;Neuromuscular re-education;Patient/family education   PT Goals (Current goals can be found in the Care Plan section) Acute Rehab PT Goals Patient Stated Goal: to have less pain PT Goal Formulation: With patient Time For Goal Achievement: 01/30/16 Potential to Achieve Goals: Good    Frequency Min 5X/week   Barriers to discharge Decreased caregiver support Pt will be alone at times    Co-evaluation               End of Session Equipment Utilized During Treatment: Gait belt Activity Tolerance: Patient tolerated treatment well Patient left: in bed;with call bell/phone within reach Nurse Communication: Mobility status    Functional Assessment Tool Used: Clinical judgement Functional Limitation: Mobility: Walking and moving around Mobility: Walking and Moving Around Current Status (B0962): At least 20 percent but less than 40 percent impaired, limited or restricted Mobility: Walking and Moving Around Goal Status [phone]): At least 1 percent but less than 20 percent impaired, limited or restricted    Time: 0907-0932 PT Time Calculation (min) (ACUTE ONLY): 25 min   Charges:   PT Evaluation $PT Eval Moderate Complexity: 1 Procedure PT Treatments $Gait  Training: 8-22 mins   PT G Codes:   PT G-Codes **NOT FOR INPATIENT CLASS** Functional Assessment Tool Used: Clinical judgement Functional Limitation: Mobility: Walking and moving around Mobility: Walking and Moving Around Current Status (H4765): At least 20 percent but less than 40 percent impaired, limited or restricted Mobility: Walking and Moving Around Goal Status [phone]): At least 1 percent but less than 20 percent impaired, limited or restricted    [name] 01/23/2016, 1:03 PM   [name], PT, DPT Acute Rehabilitation Services Pager: [phone]

## 2016-01-23 NOTE — Evaluation (Signed)
Occupational Therapy Evaluation Patient Details Name: Michael Vaughn MRN: [id number] DOB: 04-04-1961 Today's Date: 01/23/2016    History of Present Illness 55 yo male s/p L 2-3 microdisckectomy with R L2-3 extraforminal with met-rd bilateral. PMH: CAD, MI HTN DM OSA GERD hyperlipidemia hx R LE pain / weakness   Clinical Impression   Patient is s/p see above surgery resulting in functional limitations due to the deficits listed below (see OT problem list). PTA was living home alone at mod I level using crutches for transfers.  Patient will benefit from skilled OT acutely to increase independence and safety with ADLS to allow discharge [place] with DME and requesting RN. Pt will require second OT visit prior to D/c to help educate on LB dressing with AE.      Follow Up Recommendations  Home health OT;Supervision/Assistance - 24 hour    Equipment Recommendations  3 in 1 bedside comode    Recommendations for Other Services Other (comment) (pt requesting home RN visits)     Precautions / Restrictions Precautions Precautions: Back Precaution Comments: handout provided and reviewed in detail      Mobility Bed Mobility Overal bed mobility: Needs Assistance Bed Mobility: Supine to Sit     Supine to sit: Supervision     General bed mobility comments: HOB flat incr time and effort but able to complete transfer  Transfers Overall transfer level: Needs assistance Equipment used: Rolling walker (2 wheeled) Transfers: Sit to/from Stand Sit to Stand: Min assist         General transfer comment: single Ue on Rw and hand on bed surface x3 attempts. pt with L LE buckling with attempts. pt very motivated to participate . pt concerned with L LE weakness during session    Balance Overall balance assessment: Needs assistance Sitting-balance support: No upper extremity supported;Feet supported Sitting balance-Leahy Scale: Fair     Standing balance support: Single extremity  supported;During functional activity Standing balance-Leahy Scale: Poor                              ADL Overall ADL's : Needs assistance/impaired Eating/Feeding: Independent   Grooming: Wash/dry hands;Sitting Grooming Details (indicate cue type and reason): Pt requires single UE support for static standing to complete hygiene at sink level Upper Body Bathing: Minimal assitance;Sitting   Lower Body Bathing: Total assistance Lower Body Bathing Details (indicate cue type and reason): pt unable to cross bil LE           Toilet Transfer Details (indicate cue type and reason): attempting but unable to complete task. tp unable to lower to seat surface. Pt reports having a narrow bathroom setup at home and RW will not enter bathroom. pt holding door facing this session for support          Functional mobility during ADLs: Min guard;Rolling walker General ADL Comments: Pt complete bed mobility, attempted bathroom transfer without success, unable to complete LB dressing and positioned in chair for breakfast. RN notified pain level 8 out 10 currently. Pt greatly limited by pain. pt also limited by body habitus for LB adls.      Vision Vision Assessment?: No apparent visual deficits   Perception     Praxis      Pertinent Vitals/Pain Pain Assessment: Faces Pain Score: 8  Faces Pain Scale: Hurts whole lot Pain Location: reports at surgical site and sharp shooting pain in back Pain Descriptors / Indicators: Sharp;Shooting Pain Intervention(s):  Limited activity within patient's tolerance;Monitored during session;Repositioned     Hand Dominance Right   Extremity/Trunk Assessment Upper Extremity Assessment Upper Extremity Assessment: Overall WFL for tasks assessed   Lower Extremity Assessment Lower Extremity Assessment: Defer to PT evaluation;LLE deficits/detail LLE Deficits / Details: buckled twice with attempts to static stand. Pt states "i was able to do this early.  I dont know why this leg doesnt want to work with me. Give me a second and Ill try again"    Cervical / Trunk Assessment Cervical / Trunk Assessment: Other exceptions (body habitus rounded shoulders)   Communication Communication Communication: No difficulties   Cognition Arousal/Alertness: Awake/alert Behavior During Therapy: WFL for tasks assessed/performed Overall Cognitive Status: Within Functional Limits for tasks assessed                     General Comments       Exercises       Shoulder Instructions      Home Living Family/patient expects to be discharged to:: Private residence Living Arrangements: Alone Available Help at Discharge: Friend(s);Available PRN/intermittently (friends work 12 hour shifts) Type of Home: House Home Access: Stairs to enter Technical brewer of Steps: 2 Entrance Stairs-Rails: None Home Layout: One level     Bathroom Shower/Tub: Teacher, early years/pre: Standard     Home Equipment: Environmental consultant - 4 wheels;Crutches          Prior Functioning/Environment Level of Independence: Independent with assistive device(s)        Comments: ambulated with crutches but has rollator if needeed    OT Diagnosis: Generalized weakness;Acute pain   OT Problem List: Decreased strength;Decreased activity tolerance;Impaired balance (sitting and/or standing);Decreased safety awareness;Decreased knowledge of use of DME or AE;Decreased knowledge of precautions;Obesity;Pain   OT Treatment/Interventions: Self-care/ADL training;DME and/or AE instruction;Therapeutic activities;Patient/family education;Balance training    OT Goals(Current goals can be found in the care plan section) Acute Rehab OT Goals Patient Stated Goal: to have less pain OT Goal Formulation: With patient Time For Goal Achievement: 02/06/16 Potential to Achieve Goals: Good  OT Frequency: Min 2X/week   Barriers to D/C: Decreased caregiver support  pt has a ride home  from hospital but otherwise will be home for 12 hours at a time alone       Co-evaluation              End of Session Equipment Utilized During Treatment: Gait belt;Rolling walker Nurse Communication: Mobility status;Precautions  Activity Tolerance: Patient tolerated treatment well Patient left: in chair;with call bell/phone within reach   Time: 0751-0821 OT Time Calculation (min): 30 min Charges:  OT General Charges $OT Visit: 1 Procedure OT Evaluation $OT Eval Moderate Complexity: 1 Procedure OT Treatments $Self Care/Home Management : 8-22 mins G-Codes: OT G-codes **NOT FOR INPATIENT CLASS** Functional Assessment Tool Used: clinical judgement Functional Limitation: Self care Self Care Current Status (X7353): At least 60 percent but less than 80 percent impaired, limited or restricted Self Care Goal Status (G9924): At least 1 percent but less than 20 percent impaired, limited or restricted Self Care Discharge Status [phone]): At least 60 percent but less than 80 percent impaired, limited or restricted  Peri Maris 01/23/2016, 8:45 AM  Jeri Modena   OTR/L Pager: [phone] Office: [phone] .

## 2016-01-23 NOTE — Progress Notes (Signed)
Patient ID: Michael Vaughn, male   DOB: April 26, 1961, 55 y.o.   MRN: [id number] Vital signs are stable Motor function reveals weakness in left lower extremity with leg buckling Physical therapy to continue to assess today Pain score is fairly high May need CIR as patient lives independently.

## 2016-01-23 NOTE — Progress Notes (Signed)
Physical therapy evaluation and goals have now been documented. Recommendations are for home health therapies as well as occupational therapy and recommendations of home health. Patient will not need inpatient rehabilitation services at this time. Hold on formal rehabilitation consult with recommendations discharged to home

## 2016-01-23 NOTE — Progress Notes (Signed)
Physical medicine rehabilitation consult requested chart review. Presently occupational therapy evaluation completed 01/23/2016 with recommendations for home health therapies. Await physical and occupational therapy evaluation and follow-up recommendations on plan home health therapies versus CIR
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
## 2016-01-24 ENCOUNTER — Encounter (HOSPITAL_COMMUNITY): Payer: Self-pay | Admitting: Neurological Surgery

## 2016-01-24 DIAGNOSIS — M5116 Intervertebral disc disorders with radiculopathy, lumbar region: Secondary | ICD-10-CM | POA: Diagnosis not present

## 2016-01-24 LAB — GLUCOSE, CAPILLARY
GLUCOSE-CAPILLARY: 205 mg/dL — AB (ref 65–99)
GLUCOSE-CAPILLARY: 218 mg/dL — AB (ref 65–99)

## 2016-01-24 LAB — HEMOGLOBIN A1C
HEMOGLOBIN A1C: 7.6 % — AB (ref 4.8–5.6)
MEAN PLASMA GLUCOSE: 171 mg/dL

## 2016-01-24 MED ORDER — OXYCODONE-ACETAMINOPHEN 5-325 MG PO TABS: 1.0000 | ORAL_TABLET | ORAL | Status: DC | PRN

## 2016-01-24 MED ORDER — DIAZEPAM 5 MG PO TABS: 5.0000 mg | ORAL_TABLET | Freq: Four times a day (QID) | ORAL | Status: DC | PRN

## 2016-01-24 NOTE — Progress Notes (Signed)
Report called to [facility] facility. Pt's incision is clean and dry with no sign of infection. Pt's IV was removed prior to D/C. Pt D/C'd to SNF via ambulance @ 1525 per MD order. Pt is stable @ D/C and has no other needs at this time. Rema Fendt, RN

## 2016-01-24 NOTE — Care Management (Signed)
Patient is under worker's compensation. His adjuster is  [employer]. Adjuster is Sheryle Hail, [phone], Fax [phone]. DOI 01-30-93.  Case manager provided this information to social worker to arrange for shortterm rehab at [facility].

## 2016-01-24 NOTE — Care Management (Signed)
Case manager spoke with patient's adjuster, Sheryle Hail, will fax her all requested notes and orders for SNF to her at [phone]. Case manager received permission from patient prior to sending.

## 2016-01-24 NOTE — Clinical Social Work Note (Signed)
Patient will discharge to [facility] Facility has started negotiation with worker's comp.  Roddie Mc MSW, [place], [place], [phone]

## 2016-01-24 NOTE — Discharge Summary (Signed)
Physician Discharge Summary  Patient ID: Michael Vaughn MRN: [id number] DOB/AGE: 55-Apr-1962 55 y.o.  Admit date: 01/22/2016 Discharge date: 01/24/2016  Admission Diagnoses:Herniated nucleus pulposus L2-3 right extraforaminal and L2-3 left with lumbar radiculopathy. Diabetes mellitus. Morbid obesity  Discharge Diagnoses: Herniated nucleus pulposus L2-3 right extraforaminal, herniated nucleus pulposus L2-3 left, with lumbar radiculopathy bilaterally. Diabetes mellitus. Morbid obesity.  Active Problems:   Herniated nucleus pulposus, L2-3   Discharged Condition: fair  Hospital Course: Patient is a 55 year old individual who had bilateral herniated nucleus pulposus E's at L2-3 on the right side it was extraforaminal and on the left side was in the canal he underwent surgical decompression. He has some left leg weakness that seems to be exacerbated since the surgery. He is also experiencing some back pain. He lives independently and will require some convalescence before he can return home safely.  Consults: rehabilitation medicine  Significant Diagnostic Studies: None  Treatments: surgery: Lateral microdiscectomy L2-3, extraforaminal on right  Discharge Exam: Blood pressure 136/69, pulse 82, temperature 98.9 F (37.2 C), temperature source Oral, resp. rate 20, height  (1.803 m), weight 137.576 kg (303 lb 4.8 oz), SpO2 97 %. Incision is clean and dry. Motor function reveals weakness in the iliopsoas and quadriceps on the left.  Disposition:  skilled nursing facility     Medication List    ASK your doctor about these medications        allopurinol 300 MG tablet  Commonly known as:  ZYLOPRIM  Take 300 mg by mouth daily.     aspirin 81 MG tablet  Take 81 mg by mouth daily.     carvedilol 12.5 MG tablet  Commonly known as:  COREG  Take 12.5 mg by mouth 2 (two) times daily with a meal.     dorzolamide-timolol 22.3-6.8 MG/ML ophthalmic solution  Commonly known as:  COSOPT   Place 1 drop into the left eye 2 (two) times daily.     furosemide 20 MG tablet  Commonly known as:  LASIX  TAKE ONE TABLET BY MOUTH EVERY DAY     gabapentin 600 MG tablet  Commonly known as:  NEURONTIN  Take 600 mg by mouth 2 (two) times daily.     glipiZIDE 10 MG 24 hr tablet  Commonly known as:  GLUCOTROL XL  Take 10 mg by mouth daily.     HYDROcodone-acetaminophen 5-325 MG tablet  Commonly known as:  NORCO/VICODIN  Take 1-2 tablets by mouth every 6 (six) hours as needed for moderate pain.     insulin lispro protamine-lispro (75-25) 100 UNIT/ML Susp injection  Commonly known as:  HUMALOG 75/25 MIX  Inject 40-60 Units into the skin 3 (three) times daily with meals. 60 units with breakfast, 40 units with lunch, 60 units with dinner     lisinopril-hydrochlorothiazide 20-25 MG tablet  Commonly known as:  PRINZIDE,ZESTORETIC  Take 1 tablet by mouth daily.     metFORMIN 1000 MG tablet  Commonly known as:  GLUCOPHAGE  Take 1,000 mg by mouth 2 (two) times daily with a meal.     pravastatin 40 MG tablet  Commonly known as:  PRAVACHOL  Take 40 mg by mouth daily.     ranitidine 300 MG tablet  Commonly known as:  ZANTAC  Take 300 mg by mouth 2 (two) times daily.     sitaGLIPtin 100 MG tablet  Commonly known as:  JANUVIA  Take 100 mg by mouth daily.     tamsulosin 0.4 MG Caps capsule  Commonly known as:  FLOMAX  Take 0.4 mg by mouth daily.     verapamil 240 MG CR tablet  Commonly known as:  CALAN-SR  Take 240 mg by mouth daily.         SignedStefani Dama 01/24/2016, 8:55 AM

## 2016-01-24 NOTE — Clinical Social Work Note (Signed)
Clinical Social Work Assessment  Patient Details  Name: Michael Vaughn MRN: [id number] Date of Birth: Mar 15, 1961  Date of referral:  01/24/16               Reason for consult:  Facility Placement, Discharge Planning                Permission sought to share information with:  Facility Industrial/product designer granted to share information::  No  Name::        Agency::  SNFs  Relationship::     Contact Information:     Housing/Transportation Living arrangements for the past 2 months:  Single Family Home Source of Information:  Patient Patient Interpreter Needed:  None Criminal Activity/Legal Involvement Pertinent to Current Situation/Hospitalization:  No - Comment as needed Significant Relationships:  Siblings Lives with:  Self Do you feel safe going back to the place where you live?  Yes Need for family participation in patient care:  Yes (Comment)  Care giving concerns:  The patient reports that he does not feel he can manage at home by himself. He's requesting placement.    Social Worker assessment / plan: CSW me with patient to complete assessment. The patient states that he was planning to return home at discharge but now requires placement for short term rehab. CSW explained SNF search/placement process and answered the patient's questions. CSW will followup with bed offers received.  Employment status:  [employer] information:  Other (Comment Required) (Worker's Comp) PT Recommendations:  Skilled Holiday representative, Home with Home Health Information / Referral to community resources:  Skilled Nursing Facility  Patient/Family's Response to care:  The patient appears happy with the care he has received.  Patient/Family's Understanding of and Emotional Response to Diagnosis, Current Treatment, and Prognosis:  The patient appears to have a good understanding of the reason for his admission and understands what his post DC needs will be.  Emotional  Assessment Appearance:  Appears stated age Attitude/Demeanor/Rapport:  Other (Patient is appropriate and welcoming of CSW.) Affect (typically observed):  Accepting, Calm, Pleasant Orientation:  Oriented to Self, Oriented to Place, Oriented to  Time, Oriented to Situation Alcohol / Substance use:  Not Applicable Psych involvement (Current and /or in the community):  No (Comment)  Discharge Needs  Concerns to be addressed:  Discharge Planning Concerns Readmission within the last 30 days:  No Current discharge risk:  Physical Impairment Barriers to Discharge:  Continued Medical Work up   Andi Hence B, LCSW 01/24/2016, 10:16 AM

## 2016-01-24 NOTE — Clinical Social Work Placement (Signed)
   CLINICAL SOCIAL WORK PLACEMENT  NOTE  Date:  01/24/2016  Patient Details  Name: Michael Vaughn MRN: [id number] Date of Birth: [birth year]  Clinical Social Work is seeking post-discharge placement for this patient at the Skilled  Nursing Facility level of care (*CSW will initial, date and re-position this form in  chart as items are completed):  Yes   Patient/family provided with [place] Clinical Social Work Department's list of facilities offering this level of care within the geographic area requested by the patient (or if unable, by the patient's family).  Yes   Patient/family informed of their freedom to choose among providers that offer the needed level of care, that participate in Medicare, Medicaid or managed care program needed by the patient, have an available bed and are willing to accept the patient.  Yes   Patient/family informed of [employer]'s ownership interest in [facility] and [facility], as well as of the fact that they are under no obligation to receive care at these facilities.  PASRR submitted to EDS on 01/24/16     PASRR number received on 01/24/16     Existing PASRR number confirmed on       FL2 transmitted to all facilities in geographic area requested by pt/family on 01/24/16     FL2 transmitted to all facilities within larger geographic area on       Patient informed that his/her managed care company has contracts with or will negotiate with certain facilities, including the following:            Patient/family informed of bed offers received.  Patient chooses bed at       Physician recommends and patient chooses bed at      Patient to be transferred to   on  .  Patient to be transferred to facility by       Patient family notified on   of transfer.  Name of family member notified:        PHYSICIAN Please prepare priority discharge summary, including medications, Please prepare prescriptions, Please sign FL2     Additional Comment:     _______________________________________________ Venita Lick, LCSW 01/24/2016, 10:46 AM

## 2016-01-24 NOTE — Progress Notes (Signed)
Occupational Therapy Treatment Patient Details Name: Michael Vaughn MRN: [id number] DOB: 07/23/1961 Today's Date: 01/24/2016    History of present illness 55 yo male s/p L 2-3 microdisckectomy with R L2-3 extraforminal with met-rd bilateral. PMH: CAD, MI HTN DM OSA GERD hyperlipidemia hx R LE pain / weakness   OT comments  Pt with improved ADL and mobility performance during today's session. Pt able to complete LB ADL tasks with AE training and cues for safety during bathroom transfers. Pt required min assist at times for sit-stand transfers for balance impairments resulting from increased pain. Due to pt's lack of assistance at home and level of required assistance, feel as though pt would benefit greatly from post-acute rehab stay at SNF before returning home. Will continue to follow acutely.     SNF;Supervision/Assistance - 24 hour    Equipment Recommendations  3 in 1 bedside comode    Recommendations for Other Services      Precautions / Restrictions Precautions Precautions: Back Precaution Booklet Issued: No Precaution Comments: Reviewed precautions and handout during session. Pt able to recall 3/3 back precautions at end of session. Restrictions Weight Bearing Restrictions: No       Mobility Bed Mobility Overal bed mobility: Needs Assistance Bed Mobility: Rolling;Sit to Sidelying Rolling: Supervision       Sit to sidelying: Min assist General bed mobility comments: Min assist to progress bilateral LE onto bed. Verbal cues for proper log roll technique and to reposition self in bed while adhering to back precautions  Transfers Overall transfer level: Needs assistance Equipment used: Rolling walker (2 wheeled) Transfers: Sit to/from Stand Sit to Stand: Min assist         General transfer comment: Min assist for boost to stand and for balance upon standing. Good demonstration of safe hand placement on seated surfaces. No overt LOB or reports of dizziness upon  standing    Balance Overall balance assessment: Needs assistance Sitting-balance support: No upper extremity supported;Feet supported Sitting balance-Leahy Scale: Fair     Standing balance support: Bilateral upper extremity supported;During functional activity Standing balance-Leahy Scale: Poor Standing balance comment: Reliant on UE support from RW or other surfaces to maintain standing balance                   ADL Overall ADL's : Needs assistance/impaired     Grooming: Wash/dry face;Applying deodorant;Brushing hair;Set up;Sitting   Upper Body Bathing: Set up;Sitting   Lower Body Bathing: Set up;Cueing for safety;Cueing for compensatory techniques;Sit to/from stand Lower Body Bathing Details (indicate cue type and reason): educated on use of long-handled sponge Upper Body Dressing : Set up;Sitting   Lower Body Dressing: With adaptive equipment;Cueing for compensatory techniques;Sit to/from stand;Min guard Lower Body Dressing Details (indicate cue type and reason): Practiced using reacher to don underwear and pants Toilet Transfer: BSC;RW;Minimal assistance;Cueing for safety;Ambulation Toilet Transfer Details (indicate cue type and reason): BSC over toilet, cues to feel BSC on back of legs before reaching back to sit Toileting- Clothing Manipulation and Hygiene: Min guard;Sit to/from stand Toileting - Clothing Manipulation Details (indicate cue type and reason): educated on use of wet wipes for pericare Tub/ Shower Transfer: Walk-in shower;Min guard;Cueing for safety;Ambulation;Shower Technical sales engineer Details (indicate cue type and reason): NT and OT assisted Functional mobility during ADLs: Min guard;Rolling walker General ADL Comments: Reviewed and practiced use of AE for LB ADLs, practiced safe bathroom transfers and began education on fall prevention strategies.       Vision  Perception     Praxis       Cognition   Behavior During Therapy: WFL for tasks assessed/performed Overall Cognitive Status: Within Functional Limits for tasks assessed                       Extremity/Trunk Assessment               Exercises     Shoulder Instructions       General Comments      Pertinent Vitals/ Pain       Pain Assessment: 0-10 Pain Score: 9  Faces Pain Scale: Hurts little more Pain Location: back Pain Descriptors / Indicators: Aching;Sore Pain Intervention(s): Limited activity within patient's tolerance;Monitored during session;Repositioned;Patient requesting pain meds-RN notified;Ice applied  Home Living                                          Prior Functioning/Environment              Frequency Min 2X/week     Progress Toward Goals  OT Goals(current goals can now be found in the care plan section)  Progress towards OT goals: Progressing toward goals  Acute Rehab OT Goals Patient Stated Goal: to have less pain OT Goal Formulation: With patient Time For Goal Achievement: 02/06/16 Potential to Achieve Goals: Good ADL Goals Pt Will Perform Lower Body Bathing: with min guard assist;sit to/from stand;with adaptive equipment Pt Will Perform Lower Body Dressing: with min guard assist;sit to/from stand;with adaptive equipment Pt Will Transfer to Toilet: with min guard assist;ambulating;bedside commode Additional ADL Goal #1: Pt will complete bed mobility mod i   Plan Discharge plan needs to be updated    Co-evaluation                 End of Session Equipment Utilized During Treatment: Gait belt;Rolling walker   Activity Tolerance Patient limited by pain   Patient Left in bed;with call bell/phone within reach   Nurse Communication Mobility status;Patient requests pain meds    Functional Assessment Tool Used: clinical judgement Functional Limitation: Self care Self Care Current Status (W4097): At least 20 percent but less than  40 percent impaired, limited or restricted Self Care Goal Status (D5329): At least 1 percent but less than 20 percent impaired, limited or restricted   Time: 1010-1030 OT Time Calculation (min): 20 min  Charges: OT G-codes **NOT FOR INPATIENT CLASS** Functional Assessment Tool Used: clinical judgement Functional Limitation: Self care Self Care Current Status (J2426): At least 20 percent but less than 40 percent impaired, limited or restricted Self Care Goal Status (S3419): At least 1 percent but less than 20 percent impaired, limited or restricted OT General Charges $OT Visit: 1 Procedure OT Treatments $Self Care/Home Management : 8-22 mins  Redmond Baseman, OTR/L Pager: [phone] 01/24/2016, 2:48 PM

## 2016-01-24 NOTE — Progress Notes (Signed)
Physical Therapy Treatment Patient Details Name: Michael Vaughn MRN: [id number] DOB: December 09, 1960 Today's Date: 01/24/2016    History of Present Illness 55 yo male s/p L 2-3 microdisckectomy with R L2-3 extraforminal with met-rd bilateral. PMH: CAD, MI HTN DM OSA GERD hyperlipidemia hx R LE pain / weakness    PT Comments    Pt progressing towards physical therapy goals. Was able to perform transfers and ambulation with min guard to min assist. Knee buckling noted during gait training and increased UE support was noted on the RW for pt to recover. Feel this pt would benefit from continued rehab at the SNF level prior to return home due to continued knee instability and lack of assistance available at home. Will continue to follow and progress as able per POC.   Follow Up Recommendations  SNF;Supervision/Assistance - 24 hour     Equipment Recommendations  3in1 (PT)    Recommendations for Other Services       Precautions / Restrictions Precautions Precautions: Back Precaution Booklet Issued: Yes (comment) Precaution Comments: Reviewed precautions during functional mobility. Restrictions Weight Bearing Restrictions: No    Mobility  Bed Mobility Overal bed mobility: Needs Assistance Bed Mobility: Rolling;Sit to Sidelying Rolling: Supervision       Sit to sidelying: Min guard General bed mobility comments: Pt was able to transition to EOB without assistance. Min guard for trunk elevation to full sitting position.  Transfers Overall transfer level: Needs assistance Equipment used: Rolling walker (2 wheeled) Transfers: Sit to/from Stand Sit to Stand: Min assist         General transfer comment: VC's for hand placement on seated surface for safety. Min assist to power-up to full standing position with increased time.   Ambulation/Gait Ambulation/Gait assistance: Min guard Ambulation Distance (Feet): 175 Feet Assistive device: Rolling walker (2 wheeled) Gait  Pattern/deviations: Step-through pattern;Decreased stride length;Trunk flexed Gait velocity: Decreased Gait velocity interpretation: Below normal speed for age/gender General Gait Details: Slow but fairly steady. Knee buckling noted occasionally but pt was able to recover with increased UE support on the walker. Pt moving overall slow.    Stairs         General stair comments: Stair training deferred as did not feel it was safe to attempt with knees buckling as they were during gait training.   Wheelchair Mobility    Modified Rankin (Stroke Patients Only)       Balance Overall balance assessment: Needs assistance Sitting-balance support: Feet supported;No upper extremity supported Sitting balance-Leahy Scale: Fair     Standing balance support: Single extremity supported;During functional activity Standing balance-Leahy Scale: Poor Standing balance comment: Pt reaching for foot board of bed for support when walking around the bed.                    Cognition Arousal/Alertness: Awake/alert Behavior During Therapy: WFL for tasks assessed/performed Overall Cognitive Status: Within Functional Limits for tasks assessed                      Exercises      General Comments        Pertinent Vitals/Pain Pain Assessment: Faces Faces Pain Scale: Hurts little more Pain Location: Incision site Pain Descriptors / Indicators: Operative site guarding;Aching Pain Intervention(s): Limited activity within patient's tolerance;Monitored during session;Repositioned    Home Living                      Prior Function  PT Goals (current goals can now be found in the care plan section) Acute Rehab PT Goals Patient Stated Goal: to have less pain PT Goal Formulation: With patient Time For Goal Achievement: 01/30/16 Potential to Achieve Goals: Good Progress towards PT goals: Progressing toward goals    Frequency  Min 5X/week    PT Plan  Discharge plan needs to be updated    Co-evaluation             End of Session Equipment Utilized During Treatment: Gait belt Activity Tolerance: Patient tolerated treatment well Patient left: in bed;with call bell/phone within reach     Time: 0730-0800 PT Time Calculation (min) (ACUTE ONLY): 30 min  Charges:  $Gait Training: 23-37 mins                    G Codes:      Michael Vaughn 02-09-2016, 11:33 AM

## 2016-01-24 NOTE — Clinical Social Work Placement (Signed)
   CLINICAL SOCIAL WORK PLACEMENT  NOTE  Date:  01/24/2016  Patient Details  Name: Michael Vaughn MRN: [id number] Date of Birth: [birth year]  Clinical Social Work is seeking post-discharge placement for this patient at the Skilled  Nursing Facility level of care (*CSW will initial, date and re-position this form in  chart as items are completed):  Yes   Patient/family provided with [place] Clinical Social Work Department's list of facilities offering this level of care within the geographic area requested by the patient (or if unable, by the patient's family).  Yes   Patient/family informed of their freedom to choose among providers that offer the needed level of care, that participate in Medicare, Medicaid or managed care program needed by the patient, have an available bed and are willing to accept the patient.  Yes   Patient/family informed of [place]'s ownership interest in [facility] and [facility], as well as of the fact that they are under no obligation to receive care at these facilities.  PASRR submitted to EDS on 01/24/16     PASRR number received on 01/24/16     Existing PASRR number confirmed on       FL2 transmitted to all facilities in geographic area requested by pt/family on 01/24/16     FL2 transmitted to all facilities within larger geographic area on       Patient informed that his/her managed care company has contracts with or will negotiate with certain facilities, including the following:        Yes   Patient/family informed of bed offers received.  Patient chooses bed at [facility]     Physician recommends and patient chooses bed at      Patient to be transferred to [facility] on 01/24/16.  Patient to be transferred to facility by Ambulance     Patient family notified on 01/24/16 of transfer.  Name of family member notified:  [name] has notified his family.     PHYSICIAN Please prepare priority discharge summary,  including medications, Please prepare prescriptions, Please sign FL2     Additional Comment:  Per MD patient ready for DC to [facility]. RN, patient, patient's family, and facility notified of DC. RN given number for report. DC packet on chart. Ambulance transport requested for patient. Worker's comp adjuster Theodoro Grist has approved patient for SNF placement. CSW signing off.   _______________________________________________ Venita Lick, LCSW 01/24/2016, 2:30 PM

## 2016-01-24 NOTE — [place] FL2 (Signed)
[place] MEDICAID FL2 LEVEL OF CARE SCREENING TOOL     IDENTIFICATION  Patient Name: Michael Vaughn Birthdate: [birth year] Sex: male Admission Date (Current Location): 01/22/2016  [facility] and [place] Number:  [name] and Address:  [facility] [place], [address], [place], [place] [address]      Provider Number: [id number]  Attending Physician Name and Address:  Barnett Abu, MD  Relative Name and Phone Number:       Current Level of Care: Hospital Recommended Level of Care: Skilled Nursing Facility Prior Approval Number:    Date Approved/Denied:   PASRR Number: [id number]  Discharge Plan: SNF    Current Diagnoses: Patient Active Problem List   Diagnosis Date Noted  . Herniated nucleus pulposus, L2-3 01/22/2016  . Sleep apnea 06/27/2011  . Shortness of breath   . Hypertension   . Dyslipidemia   . GERD (gastroesophageal reflux disease)   . Anomalous right coronary artery   . Morbid obesity (HCC)   . Lumbar disc disease   . Bradycardia   . Nonischemic cardiomyopathy (HCC)   . CAD (coronary artery disease)   . Ejection fraction     Orientation RESPIRATION BLADDER Height & Weight     Self, Time, Situation, Place  Normal Continent Weight: (!) 137.576 kg (303 lb 4.8 oz) Height:  5\' 11"  (180.3 cm)  BEHAVIORAL SYMPTOMS/MOOD NEUROLOGICAL BOWEL NUTRITION STATUS   (NONE)  (NONE) Continent  (Carb Modified.)  AMBULATORY STATUS COMMUNICATION OF NEEDS Skin   Limited Assist Verbally Surgical wounds (Incision of back)                       Personal Care Assistance Level of Assistance  Bathing, Feeding, Dressing Bathing Assistance: Limited assistance Feeding assistance: Independent Dressing Assistance: Limited assistance     Functional Limitations Info  Sight, Hearing, Speech Sight Info: Adequate Hearing Info: Adequate Speech Info: Adequate    SPECIAL CARE FACTORS FREQUENCY  PT (By licensed PT), OT (By licensed OT)     PT  Frequency: 5/week OT Frequency: 5/week            Contractures Contractures Info: Not present    Additional Factors Info  Code Status, Allergies, Insulin Sliding Scale Code Status Info: Full Allergies Info: NKDA   Insulin Sliding Scale Info: 3/day       Current Medications (01/24/2016):  This is the current hospital active medication list Current Facility-Administered Medications  Medication Dose Route Frequency Provider Last Rate Last Dose  . 0.9 %  sodium chloride infusion  250 mL Intravenous Continuous Barnett Abu, MD   0 mL at 01/22/16 2126  . acetaminophen (TYLENOL) tablet 650 mg  650 mg Oral Q4H PRN Barnett Abu, MD       Or  . acetaminophen (TYLENOL) suppository 650 mg  650 mg Rectal Q4H PRN Barnett Abu, MD      . allopurinol (ZYLOPRIM) tablet 300 mg  300 mg Oral Daily Barnett Abu, MD   300 mg at 01/24/16 1033  . alum & mag hydroxide-simeth (MAALOX/MYLANTA) 200-200-20 MG/5ML suspension 30 mL  30 mL Oral Q6H PRN Barnett Abu, MD      . bisacodyl (DULCOLAX) suppository 10 mg  10 mg Rectal Daily PRN Barnett Abu, MD      . carvedilol (COREG) tablet 12.5 mg  12.5 mg Oral BID WC Barnett Abu, MD   12.5 mg at 01/24/16 0839  . diazepam (VALIUM) tablet 5 mg  5 mg Oral  Q6H PRN Barnett Abu, MD   5 mg at 01/24/16 0522  . docusate sodium (COLACE) capsule 100 mg  100 mg Oral BID Barnett Abu, MD   100 mg at 01/24/16 1031  . dorzolamide-timolol (COSOPT) 22.3-6.8 MG/ML ophthalmic solution 1 drop  1 drop Left Eye BID Barnett Abu, MD   1 drop at 01/22/16 2200  . famotidine (PEPCID) tablet 10 mg  10 mg Oral BID Barnett Abu, MD   10 mg at 01/24/16 1031  . furosemide (LASIX) tablet 20 mg  20 mg Oral Daily Barnett Abu, MD   20 mg at 01/24/16 1032  . gabapentin (NEURONTIN) tablet 600 mg  600 mg Oral BID Barnett Abu, MD   600 mg at 01/24/16 1032  . glipiZIDE (GLUCOTROL XL) 24 hr tablet 10 mg  10 mg Oral Daily Barnett Abu, MD   10 mg at 01/24/16 1033  . lisinopril (PRINIVIL,ZESTRIL) tablet  20 mg  20 mg Oral Daily Gerhard Munch Hammons, RPH   20 mg at 01/24/16 1032   And  . hydrochlorothiazide (HYDRODIURIL) tablet 25 mg  25 mg Oral Daily Kimberly B Hammons, RPH   25 mg at 01/24/16 1031  . HYDROcodone-acetaminophen (NORCO/VICODIN) 5-325 MG per tablet 1-2 tablet  1-2 tablet Oral Q4H PRN Barnett Abu, MD      . insulin aspart (novoLOG) injection 0-20 Units  0-20 Units Subcutaneous TID WC Barnett Abu, MD   7 Units at 01/24/16 0840  . lactated ringers infusion   Intravenous Continuous Achille Rich, MD 10 mL/hr at 01/22/16 1241    . linagliptin (TRADJENTA) tablet 5 mg  5 mg Oral Daily Barnett Abu, MD   5 mg at 01/24/16 1033  . menthol-cetylpyridinium (CEPACOL) lozenge 3 mg  1 lozenge Oral PRN Barnett Abu, MD       Or  . phenol (CHLORASEPTIC) mouth spray 1 spray  1 spray Mouth/Throat PRN Barnett Abu, MD      . metFORMIN (GLUCOPHAGE) tablet 1,000 mg  1,000 mg Oral BID WC Barnett Abu, MD   1,000 mg at 01/24/16 0839  . morphine 2 MG/ML injection 1-4 mg  1-4 mg Intravenous Q3H PRN Barnett Abu, MD   4 mg at 01/22/16 2125  . ondansetron (ZOFRAN) injection 4 mg  4 mg Intravenous Q4H PRN Barnett Abu, MD      . oxyCODONE-acetaminophen (PERCOCET/ROXICET) 5-325 MG per tablet 1-2 tablet  1-2 tablet Oral Q4H PRN Barnett Abu, MD   2 tablet at 01/24/16 1031  . polyethylene glycol (MIRALAX / GLYCOLAX) packet 17 g  17 g Oral Daily PRN Barnett Abu, MD      . pravastatin (PRAVACHOL) tablet 40 mg  40 mg Oral Daily Barnett Abu, MD   40 mg at 01/23/16 0900  . senna (SENOKOT) tablet 8.6 mg  1 tablet Oral BID Barnett Abu, MD   8.6 mg at 01/24/16 1032  . sodium chloride flush (NS) 0.9 % injection 3 mL  3 mL Intravenous Q12H Barnett Abu, MD   3 mL at 01/22/16 2127  . sodium chloride flush (NS) 0.9 % injection 3 mL  3 mL Intravenous PRN Barnett Abu, MD      . tamsulosin (FLOMAX) capsule 0.4 mg  0.4 mg Oral Daily Barnett Abu, MD   0.4 mg at 01/24/16 1032  . verapamil (CALAN-SR) CR tablet 240 mg  240 mg  Oral Daily Barnett Abu, MD   240 mg at 01/24/16 1033     Discharge Medications: Please see discharge summary for a list  of discharge medications.  Relevant Imaging Results:  Relevant Lab Results:   Additional Information SSN: [id number]. WORKER'S COMP INFO: Adjuster Sheryle Hail with [facility]. Contact information: [phone]. Her fax number is [phone]. Claim #: [id number], date of original injury is 01/30/93.  Venita Lick, LCSW

## 2016-01-24 NOTE — Clinical Social Work Note (Signed)
CSW was notified by RN on unit that the patient will need placement at time of discharge. Per RN, the patient is ready for DC. CSW met with patient and explained that the placement process will move very quickly since he is ready today. CSW explained that the patient will have to discharge to the first available bed. CSW will start placement process.  Liz Beach MSW, [place], [place], [phone]

## 2016-01-24 NOTE — Progress Notes (Addendum)
Inpatient Diabetes Program Recommendations  AACE/ADA: New Consensus Statement on Inpatient Glycemic Control (2015)  Target Ranges:  Prepandial:   less than 140 mg/dL      Peak postprandial:   less than 180 mg/dL (1-2 hours)      Critically ill patients:  140 - 180 mg/dL   Review of Glycemic Control Results for Michael Vaughn, Michael Vaughn (MRN [id number]) as of 01/24/2016 09:56  Ref. Range 01/23/2016 12:24 01/23/2016 14:37 01/23/2016 17:48 01/23/2016 22:10 01/24/2016 08:06  Glucose-Capillary Latest Ref Range: 65-99 mg/dL 395 (H) 320 (H) 233 (H) 199 (H) 205 (H)   Diabetes history: DM Type 2 Outpatient Diabetes medications: Humalog 75/25 60 ac breakfast, 40 units ac lunch, 60 units ac dinner + Januvia 100 q d+ Glipizide 10 mg qd Current orders for Inpatient glycemic control: Tradjenta 5 mg + Glipizide 10 mg q d + Metformin 1 gm bid + Novolog correction 0-20 units (resistant) tid  Inpatient Diabetes Program Recommendations:  Noted elevated CBGs. Please consider restarting 50% of home dose Humalog 70/30 ac meals bid.  1:30 Spoke with patient and  nurse --reviewed patient is taking  75/25 insulin 60 units ac breakfast, 40 units ac lunch, & 60 units ac dinner. Patient is not currently on meal coverage. Nurse to review on discharge with receiving facility.  Thank you, [name]. [name], RN, MSN, CDE Inpatient Glycemic Control Team Team Pager [phone] (8am-5pm) 01/24/2016 9:59 AM

## 2016-01-24 NOTE — Progress Notes (Signed)
Patient ID: Michael Vaughn, male   DOB: 03-19-1961, 55 y.o.   MRN: [id number] Vital signs are stable Patient is still experiencing left lower extremity weakness A great deal of difficulty moving about independently Patient lives independently and will not have help in the home Will likely need skilled nursing facility to undergo some rehabilitation before he can be discharged to home This is being arranged Tentative discharge dictated

## 2017-04-30 ENCOUNTER — Encounter (INDEPENDENT_AMBULATORY_CARE_PROVIDER_SITE_OTHER): Payer: Self-pay | Admitting: Internal Medicine

## 2017-04-30 ENCOUNTER — Encounter (INDEPENDENT_AMBULATORY_CARE_PROVIDER_SITE_OTHER): Payer: Self-pay

## 2017-05-18 ENCOUNTER — Ambulatory Visit (INDEPENDENT_AMBULATORY_CARE_PROVIDER_SITE_OTHER): Payer: Self-pay | Admitting: Internal Medicine

## 2017-08-04 ENCOUNTER — Encounter (HOSPITAL_COMMUNITY): Payer: Self-pay | Admitting: Emergency Medicine

## 2017-08-04 ENCOUNTER — Other Ambulatory Visit: Payer: Self-pay

## 2017-08-04 ENCOUNTER — Observation Stay (HOSPITAL_COMMUNITY)
Admission: EM | Admit: 2017-08-04 | Discharge: 2017-08-05 | Disposition: A | Discharge status: Home / Self Care | Payer: Medicaid Other | Attending: Internal Medicine | Admitting: Internal Medicine

## 2017-08-04 ENCOUNTER — Emergency Department (HOSPITAL_COMMUNITY): Payer: Medicaid Other

## 2017-08-04 DIAGNOSIS — Z7982 Long term (current) use of aspirin: Secondary | ICD-10-CM | POA: Insufficient documentation

## 2017-08-04 DIAGNOSIS — I2510 Atherosclerotic heart disease of native coronary artery without angina pectoris: Secondary | ICD-10-CM | POA: Diagnosis not present

## 2017-08-04 DIAGNOSIS — Z7984 Long term (current) use of oral hypoglycemic drugs: Secondary | ICD-10-CM | POA: Diagnosis not present

## 2017-08-04 DIAGNOSIS — N179 Acute kidney failure, unspecified: Principal | ICD-10-CM | POA: Insufficient documentation

## 2017-08-04 DIAGNOSIS — E875 Hyperkalemia: Secondary | ICD-10-CM | POA: Diagnosis not present

## 2017-08-04 DIAGNOSIS — Z23 Encounter for immunization: Secondary | ICD-10-CM | POA: Diagnosis not present

## 2017-08-04 DIAGNOSIS — R079 Chest pain, unspecified: Secondary | ICD-10-CM | POA: Diagnosis present

## 2017-08-04 DIAGNOSIS — Z79899 Other long term (current) drug therapy: Secondary | ICD-10-CM | POA: Insufficient documentation

## 2017-08-04 DIAGNOSIS — Z87891 Personal history of nicotine dependence: Secondary | ICD-10-CM | POA: Diagnosis not present

## 2017-08-04 LAB — CBC
HCT: 36.2 % — ABNORMAL LOW (ref 39.0–52.0)
HEMATOCRIT: 37.9 % — AB (ref 39.0–52.0)
Hemoglobin: 12.7 g/dL — ABNORMAL LOW (ref 13.0–17.0)
Hemoglobin: 12.2 g/dL — ABNORMAL LOW (ref 13.0–17.0)
MCH: 30.3 pg (ref 26.0–34.0)
MCH: 30.4 pg (ref 26.0–34.0)
MCHC: 33.5 g/dL (ref 30.0–36.0)
MCHC: 33.7 g/dL (ref 30.0–36.0)
MCV: 90.5 fL (ref 78.0–100.0)
MCV: 90.3 fL (ref 78.0–100.0)
PLATELETS: 281 10*3/uL (ref 150–400)
PLATELETS: 265 10*3/uL (ref 150–400)
RBC: 4.19 MIL/uL — ABNORMAL LOW (ref 4.22–5.81)
RBC: 4.01 MIL/uL — ABNORMAL LOW (ref 4.22–5.81)
RDW: 14.9 % (ref 11.5–15.5)
RDW: 14.9 % (ref 11.5–15.5)
WBC: 11.1 10*3/uL — AB (ref 4.0–10.5)
WBC: 9.5 10*3/uL (ref 4.0–10.5)

## 2017-08-04 LAB — CREATININE, SERUM
CREATININE: 2.07 mg/dL — AB (ref 0.61–1.24)
GFR calc Af Amer: 40 mL/min — ABNORMAL LOW (ref >60)
GFR calc non Af Amer: 34 mL/min — ABNORMAL LOW (ref >60)

## 2017-08-04 LAB — BASIC METABOLIC PANEL
Anion gap: 10 (ref 5–15)
BUN: 40 mg/dL — AB (ref 6–20)
CALCIUM: 10.1 mg/dL (ref 8.9–10.3)
CO2: 31 mmol/L (ref 22–32)
CREATININE: 2.16 mg/dL — AB (ref 0.61–1.24)
Chloride: 93 mmol/L — ABNORMAL LOW (ref 101–111)
GFR calc Af Amer: 38 mL/min — ABNORMAL LOW (ref >60)
GFR, EST NON AFRICAN AMERICAN: 32 mL/min — AB (ref >60)
GLUCOSE: 213 mg/dL — AB (ref 65–99)
Potassium: 5.2 mmol/L — ABNORMAL HIGH (ref 3.5–5.1)
SODIUM: 134 mmol/L — AB (ref 135–145)

## 2017-08-04 LAB — GLUCOSE, CAPILLARY: GLUCOSE-CAPILLARY: 283 mg/dL — AB (ref 65–99)

## 2017-08-04 LAB — TROPONIN I: Troponin I: <0.03 ng/mL (ref <0.03)

## 2017-08-04 MED ORDER — GABAPENTIN 300 MG PO CAPS
600.0000 mg | ORAL_CAPSULE | Freq: Two times a day (BID) | ORAL | Status: DC
  Administered 2017-08-04 – 2017-08-05 (×2): 600 mg via ORAL
  Filled 2017-08-04 (×2): qty 2

## 2017-08-04 MED ORDER — PRAVASTATIN SODIUM 40 MG PO TABS
40.0000 mg | ORAL_TABLET | Freq: Every day | ORAL | Status: DC
  Administered 2017-08-04: 40 mg via ORAL
  Filled 2017-08-04 (×2): qty 1

## 2017-08-04 MED ORDER — METOPROLOL SUCCINATE ER 25 MG PO TB24
37.5000 mg | ORAL_TABLET | Freq: Every day | ORAL | Status: DC
  Administered 2017-08-04 – 2017-08-05 (×2): 37.5 mg via ORAL
  Filled 2017-08-04 (×2): qty 2

## 2017-08-04 MED ORDER — INSULIN ASPART 100 UNIT/ML ~~LOC~~ SOLN
0.0000 [IU] | Freq: Three times a day (TID) | SUBCUTANEOUS | Status: DC
  Administered 2017-08-05: 9 [IU] via SUBCUTANEOUS
  Administered 2017-08-05: 3 [IU] via SUBCUTANEOUS

## 2017-08-04 MED ORDER — INFLUENZA VAC SPLIT QUAD 0.5 ML IM SUSY
0.5000 mL | PREFILLED_SYRINGE | INTRAMUSCULAR | Status: AC
  Administered 2017-08-05: 0.5 mL via INTRAMUSCULAR
  Filled 2017-08-04: qty 0.5

## 2017-08-04 MED ORDER — TAMSULOSIN HCL 0.4 MG PO CAPS
0.4000 mg | ORAL_CAPSULE | Freq: Every day | ORAL | Status: DC
  Administered 2017-08-04 – 2017-08-05 (×2): 0.4 mg via ORAL
  Filled 2017-08-04 (×2): qty 1

## 2017-08-04 MED ORDER — ALLOPURINOL 300 MG PO TABS
300.0000 mg | ORAL_TABLET | Freq: Every day | ORAL | Status: DC
  Administered 2017-08-04 – 2017-08-05 (×2): 300 mg via ORAL
  Filled 2017-08-04 (×2): qty 1

## 2017-08-04 MED ORDER — AMIODARONE HCL 200 MG PO TABS
200.0000 mg | ORAL_TABLET | Freq: Every day | ORAL | Status: DC
  Administered 2017-08-04 – 2017-08-05 (×2): 200 mg via ORAL
  Filled 2017-08-04 (×2): qty 1

## 2017-08-04 MED ORDER — ASPIRIN 81 MG PO CHEW
162.0000 mg | CHEWABLE_TABLET | Freq: Every day | ORAL | Status: DC
  Administered 2017-08-04 – 2017-08-05 (×2): 162 mg via ORAL
  Filled 2017-08-04 (×3): qty 2

## 2017-08-04 MED ORDER — SODIUM CHLORIDE 0.9 % IV BOLUS (SEPSIS)
500.0000 mL | Freq: Once | INTRAVENOUS | Status: AC
  Administered 2017-08-04: 500 mL via INTRAVENOUS

## 2017-08-04 MED ORDER — OMEGA-3-ACID ETHYL ESTERS 1 G PO CAPS
1.0000 g | ORAL_CAPSULE | Freq: Every day | ORAL | Status: DC
  Administered 2017-08-04 – 2017-08-05 (×2): 1 g via ORAL
  Filled 2017-08-04 (×2): qty 1

## 2017-08-04 MED ORDER — ENOXAPARIN SODIUM 40 MG/0.4ML ~~LOC~~ SOLN
40.0000 mg | SUBCUTANEOUS | Status: DC
  Administered 2017-08-04: 40 mg via SUBCUTANEOUS
  Filled 2017-08-04: qty 0.4

## 2017-08-04 MED ORDER — ONDANSETRON HCL 4 MG/2ML IJ SOLN
4.0000 mg | Freq: Four times a day (QID) | INTRAMUSCULAR | Status: DC | PRN
  Administered 2017-08-05: 4 mg via INTRAVENOUS
  Filled 2017-08-04: qty 2

## 2017-08-04 MED ORDER — HYDROCODONE-ACETAMINOPHEN 5-325 MG PO TABS: 1.0000 | ORAL_TABLET | Freq: Four times a day (QID) | ORAL | Status: DC | PRN

## 2017-08-04 NOTE — Progress Notes (Signed)
Pt set up on BIPAP with full mask mask and  auto titrate max pressure of 20 min pressure of 5 pt tol. Well RT will continue to monitor

## 2017-08-04 NOTE — ED Notes (Signed)
Pt has lifevest on at present time. Pt states since yesterday pt has had intermittent chest discomfort without radiating pain. Denies SOB. Pt has had lots of nausea for one week but denies vomiting. Pt states md did change a few medications around one week ago.

## 2017-08-04 NOTE — ED Triage Notes (Signed)
Pt states he began having CP, SOB, weakness, and N/ two days ago. Had a triple bipass in August and is wearing a life vest.

## 2017-08-04 NOTE — ED Provider Notes (Addendum)
[facility] EMERGENCY DEPARTMENT Provider Note   CSN: [id number] Arrival date & time: 08/04/17  1044     History   Chief Complaint Chief Complaint  Patient presents with  . Chest Pain    HPI Michael Vaughn is a 56 y.o. male.  Intermittent chest pain for the past 24 hours without dyspnea, diaphoresis, but positive for nausea.  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  He also describes a fluttering sensation in his heart.  New cardiac medicines include spironolactone, amiodarone, metoprolol.  Januvia was recently discontinued and Jardiance started.  He has never had an elevated creatinine to his knowledge.  All of his cardiac evaluation has been at [facility].  Severity of symptoms is mild to moderate.  Nothing makes it better or worse.  Patient has an external pacemaker device in place.        Past Medical History:  Diagnosis Date  . Anginal pain (HCC)   . 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 Bradycardia    July, 2012, related to medication  . CAD (coronary artery disease)    Some coronary irregularities by catheterization 2006 /  nuclear, July, 20 12  ,  question of some ischemia in the lateral wall although technically quite difficult.  . Diabetes mellitus   . Dyslipidemia   . Dysrhythmia   . Ejection fraction    Improved from the past  /  ejection fraction 50%, echo, July, 2012, hypokinesis at the base of the inferior wall.  Marland Kitchen GERD (gastroesophageal reflux disease)   . Headache   . History of hiatal hernia   . Hypertension   . Lumbar disc disease   . Morbid obesity (HCC)   . Myocardial infarction (HCC)   . 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 OSA (obstructive sleep apnea)   . Shortness of breath    September, 2012    Patient Active Problem List   Diagnosis Date Noted  . Herniated nucleus pulposus, L2-3 01/22/2016  . Sleep apnea 06/27/2011  . Shortness of breath   . Hypertension   . Dyslipidemia   . GERD (gastroesophageal reflux disease)   . Anomalous right coronary artery   . Morbid obesity (HCC)   . Lumbar disc disease   . Bradycardia   . Nonischemic cardiomyopathy (HCC)   . CAD (coronary artery disease)   . Ejection fraction     Past Surgical History:  Procedure Laterality Date  . CARDIAC CATHETERIZATION  12/20/2004   EF was 60% with normal wall motion. Nonobstructive coronary artery disease.  Marland Kitchen COLONOSCOPY    . ESOPHAGOGASTRODUODENOSCOPY    . none     no surgeries noted  . triple bipass    . WISDOM TOOTH EXTRACTION         Home Medications    Prior to Admission medications   Medication Sig Start Date End Date Taking? Authorizing Provider  allopurinol (ZYLOPRIM) 300 MG tablet Take 300 mg by mouth daily.   Yes [provider]  amiodarone (PACERONE) 200 MG tablet Take 200 mg daily by mouth. 06/25/17  Yes [provider]  aspirin 81 MG tablet Take 162 mg daily by mouth.    Yes [provider]  empagliflozin (JARDIANCE) 10 MG TABS tablet Take 10 mg daily by mouth. 07/23/17  Yes [provider]  furosemide (  LASIX) 80 MG tablet Take 80 mg daily by mouth.   Yes [provider]  gabapentin (NEURONTIN) 600 MG tablet Take 600 mg by mouth 2 (two) times daily.    Yes [provider]  glipiZIDE (GLUCOTROL XL) 10 MG 24 hr tablet Take 10 mg by mouth daily.     Yes [provider]  HYDROcodone-acetaminophen (NORCO/VICODIN) 5-325 MG tablet Take 1 tablet every 6 (six) hours as needed by mouth for moderate pain.   Yes [provider]  ibuprofen (ADVIL,MOTRIN) 400 MG tablet Take 400 mg every 6 (six) hours as needed by mouth.   Yes [provider]  lisinopril (PRINIVIL,ZESTRIL) 40 MG tablet Take 40 mg daily by mouth.   Yes [provider]    metFORMIN (GLUCOPHAGE) 1000 MG tablet Take 1,000 mg by mouth 2 (two) times daily with a meal.     Yes [provider]  metoprolol succinate (TOPROL-XL) 25 MG 24 hr tablet Take 37.5 mg daily by mouth. 06/25/17  Yes [provider]  Multiple Vitamin (MULTI-VITAMINS) TABS Take 1 tablet daily by mouth.   Yes [provider]  omega-3 acid ethyl esters (LOVAZA) 1 g capsule Take 1 g daily by mouth.   Yes [provider]  pravastatin (PRAVACHOL) 40 MG tablet Take 40 mg by mouth daily.     Yes [provider]  ranitidine (ZANTAC) 300 MG tablet Take 300 mg by mouth 2 (two) times daily.   Yes [provider]  senna-docusate (SENOKOT-S) 8.6-50 MG tablet Take 2 tablets at bedtime by mouth. 05/19/17  Yes [provider]  spironolactone (ALDACTONE) 25 MG tablet Take 12.5 mg daily by mouth. 06/25/17  Yes [provider]  tamsulosin (FLOMAX) 0.4 MG CAPS capsule Take 0.4 mg by mouth daily.   Yes [provider]  tadalafil (CIALIS) 20 MG tablet Take 1 tablet daily as needed by mouth. 07/20/17   [provider]    Family History Family History  Problem Relation Age of Onset  . Other Sister        has pacemaker  . Other Brother        has pacemaker    Social History Social History   Tobacco Use  . Smoking status: Former Smoker    Packs/day: 1.00    Years: 5.00    Pack years: 5.00    Types: Cigarettes    Last attempt to quit: 09/22/1989    Years since quitting: 27.8  . Smokeless tobacco: Former Neurosurgeon    Types: Snuff    Quit date: 09/22/1989  . Tobacco comment: dipped snuff for one year  Substance Use Topics  . Alcohol use: No  . Drug use: No     Allergies   Patient has no known allergies.   Review of Systems Review of Systems  All other systems reviewed and are negative.    Physical Exam Updated Vital Signs BP 131/69   Pulse 63   Temp 98.1 F (36.7 C) (Oral)   Resp 18   Ht 5\' 11"  (1.803 m)   Wt  117.9 kg (260 lb)   SpO2 100%   BMI 36.26 kg/m   Physical Exam  Constitutional: He is oriented to person, place, and time. He appears well-developed and well-nourished.  HENT:  Head: Normocephalic and atraumatic.  Eyes: Conjunctivae are normal.  Neck: Neck supple.  Cardiovascular: Normal rate and regular rhythm.  Pulmonary/Chest: Effort normal and breath sounds normal.  Abdominal: Soft. Bowel sounds are normal.  Musculoskeletal: Normal range of motion.  Neurological: He is alert and oriented to person, place, and time.  Skin: Skin is warm and dry.  Psychiatric: He has a normal mood and affect. His behavior is normal.  Nursing note and vitals reviewed.    ED Treatments / Results  Labs (all labs ordered are listed, but only abnormal results are displayed) Labs Reviewed  BASIC METABOLIC PANEL - Abnormal; Notable for the following components:      Result Value   Sodium 134 (*)    Potassium 5.2 (*)    Chloride 93 (*)    Glucose, Bld 213 (*)    BUN 40 (*)    Creatinine, Ser 2.16 (*)    GFR calc non Af Amer 32 (*)    GFR calc Af Amer 38 (*)    All other components within normal limits  CBC - Abnormal; Notable for the following components:   WBC 11.1 (*)    RBC 4.19 (*)    Hemoglobin 12.7 (*)    HCT 37.9 (*)    All other components within normal limits  TROPONIN I    EKG  EKG Interpretation  Date/Time:  Tuesday August 04 2017 10:53:31 EST Ventricular Rate:  80 PR Interval:  192 QRS Duration: 94 QT Interval:  378 QTC Calculation: 435 R Axis:   74 Text Interpretation:  Normal sinus rhythm T wave abnormality, consider inferolateral ischemia Abnormal ECG Confirmed by Donnetta Hutchingook, Purnell Daigle ([id number]) on 08/04/2017 1:23:05 PM       Radiology Dg Chest 2 View  Result Date: 08/04/2017 CLINICAL DATA:  Chest pain.  Shortness of breath . EXAM: CHEST  2 VIEW COMPARISON:  CT 03/31/2017. Chest x-ray 03/31/2017. Chest x-ray 01/22/2017 . FINDINGS: Mediastinum and hilar structures normal.  Prior median sternotomy and CAD. Heart size normal. Mild left base subsegmental atelectasis. No pleural effusion or pneumothorax. Fracture of the left first rib cannot be excluded. This may be postsurgical. Adjacent pleural thickening noted. Clinical correlation suggested . IMPRESSION: 1.  Prior median sternotomy and CABG. 2. Mild left base subsegmental atelectasis . 3. Fractured left first rib cannot be excluded. This may be postsurgical. Adjacent pleural thickening noted. Clinical correlation suggested. No pneumothorax. Electronically Signed   By: Maisie Fushomas  Register   On: 08/04/2017 11:15    Procedures Procedures (including critical care time)  Medications Ordered in ED Medications  sodium chloride 0.9 % bolus 500 mL (not administered)     Initial Impression / Assessment and Plan / ED Course  I have reviewed the triage vital signs and the nursing notes.  Pertinent labs & imaging results that were available during my care of the patient were reviewed by me and considered in my medical decision making (see chart for details).     Patient with known cardiac disease presents with chest pain.  He is hemodynamically stable.  K 5ppp.1 and Creatinine 2.1  Final Clinical Impressions(s) / ED Diagnoses   Final diagnoses:  AKI (acute kidney injury) [facility](HCC)  Hyperkalemia    ED Discharge Orders    None       Donnetta Hutchingook, Shirlena Brinegar, MD 08/04/17 1554    Donnetta Hutchingook, Brendi Mccarroll, MD 08/04/17 [phone]

## 2017-08-04 NOTE — ED Notes (Signed)
Pt remains pain free at this time. Pt ate meal tray that was given. Pt resting with NAD noted.

## 2017-08-04 NOTE — H&P (Signed)
TRH H&P    Patient Demographics:    Michael Vaughn, is a 56 y.o. male  MRN: [id number]  DOB - 02-23-1961  Admit Date - 08/04/2017  Referring MD/NP/PA: Dr. Adriana Simas  Outpatient Primary MD for the patient is Toma Deiters, MD  Patient coming from: Home  Chief Complaint  Patient presents with  . Chest Pain      HPI:  With history of  Michael Vaughn  is a 56 y.o. male, with history of CAD, ischemic cardiomyopathy, diabetes mellitus, hypertension who recently underwent CABG x3 vessel at [facility], echocardiogram showed 30% EF.  Patient has LifeVest in place. Today came to hospital with nausea, intermittent chest pain for the past 2 days.  Patient also has been having fluttering sensation in the chest intermittently. Patient's diabetic medication Januvia was recently discontinued and he was started on Jardiance by his PCP. Patient has been urinating more frequently since starting Jardiance. In the ED lab work revealed creatinine of 2.16, his previous creatinine from August 31 was 1.31. Cardiac enzymes x1 is negative in the ED.  He denies vomiting or diarrhea No shortness of breath. Denies dysuria, urgency or frequency of urination. Denies fever or chills.   Review of systems:      All other systems reviewed and are negative.   With Past History of the following :    Past Medical History:  Diagnosis Date  . Anginal pain (HCC)   . 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 Bradycardia    July, 2012, related to medication  . CAD (coronary artery disease)    Some coronary irregularities by catheterization 2006 /  nuclear, July, 20 12  ,  question of some ischemia in the lateral wall although technically quite difficult.  . Diabetes mellitus   . Dyslipidemia   . Dysrhythmia   . Ejection fraction    Improved from the past  /  ejection fraction 50%, echo, July,  2012, hypokinesis at the base of the inferior wall.  Marland Kitchen GERD (gastroesophageal reflux disease)   . Headache   . History of hiatal hernia   . Hypertension   . Lumbar disc disease   . Morbid obesity (HCC)   . Myocardial infarction (HCC)   . 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 OSA (obstructive sleep apnea)   . Shortness of breath    September, 2012      Past Surgical History:  Procedure Laterality Date  . CARDIAC CATHETERIZATION  12/20/2004   EF was 60% with normal wall motion. Nonobstructive coronary artery disease.  Marland Kitchen COLONOSCOPY    . ESOPHAGOGASTRODUODENOSCOPY    . none     no surgeries noted  . triple bipass    . WISDOM TOOTH EXTRACTION        Social History:      Social History   Tobacco Use  . Smoking  status: Former Smoker    Packs/day: 1.00    Years: 5.00    Pack years: 5.00    Types: Cigarettes    Last attempt to quit: 09/22/1989    Years since quitting: 27.8  . Smokeless tobacco: Former Neurosurgeon    Types: Snuff    Quit date: 09/22/1989  . Tobacco comment: dipped snuff for one year  Substance Use Topics  . Alcohol use: No       Family History :     Family History  Problem Relation Age of Onset  . Other Sister        has pacemaker  . Other Brother        has pacemaker      Home Medications:   Prior to Admission medications   Medication Sig Start Date End Date Taking? Authorizing Provider  allopurinol (ZYLOPRIM) 300 MG tablet Take 300 mg by mouth daily.   Yes [provider]  amiodarone (PACERONE) 200 MG tablet Take 200 mg daily by mouth. 06/25/17  Yes [provider]  aspirin 81 MG tablet Take 162 mg daily by mouth.    Yes [provider]  empagliflozin (JARDIANCE) 10 MG TABS tablet Take 10 mg daily by mouth. 07/23/17  Yes [provider]  furosemide  (LASIX) 80 MG tablet Take 80 mg daily by mouth.   Yes [provider]  gabapentin (NEURONTIN) 600 MG tablet Take 600 mg by mouth 2 (two) times daily.    Yes [provider]  glipiZIDE (GLUCOTROL XL) 10 MG 24 hr tablet Take 10 mg by mouth daily.     Yes [provider]  HYDROcodone-acetaminophen (NORCO/VICODIN) 5-325 MG tablet Take 1 tablet every 6 (six) hours as needed by mouth for moderate pain.   Yes [provider]  ibuprofen (ADVIL,MOTRIN) 400 MG tablet Take 400 mg every 6 (six) hours as needed by mouth.   Yes [provider]  lisinopril (PRINIVIL,ZESTRIL) 40 MG tablet Take 40 mg daily by mouth.   Yes [provider]  metFORMIN (GLUCOPHAGE) 1000 MG tablet Take 1,000 mg by mouth 2 (two) times daily with a meal.     Yes [provider]  metoprolol succinate (TOPROL-XL) 25 MG 24 hr tablet Take 37.5 mg daily by mouth. 06/25/17  Yes [provider]  Multiple Vitamin (MULTI-VITAMINS) TABS Take 1 tablet daily by mouth.   Yes [provider]  omega-3 acid ethyl esters (LOVAZA) 1 g capsule Take 1 g daily by mouth.   Yes [provider]  pravastatin (PRAVACHOL) 40 MG tablet Take 40 mg by mouth daily.     Yes [provider]  ranitidine (ZANTAC) 300 MG tablet Take 300 mg by mouth 2 (two) times daily.   Yes [provider]  senna-docusate (SENOKOT-S) 8.6-50 MG tablet Take 2 tablets at bedtime by mouth. 05/19/17  Yes [provider]  spironolactone (ALDACTONE) 25 MG tablet Take 12.5 mg daily by mouth. 06/25/17  Yes [provider]  tamsulosin (FLOMAX) 0.4 MG CAPS capsule Take 0.4 mg by mouth daily.   Yes [provider]  tadalafil (CIALIS) 20 MG tablet Take 1 tablet daily as needed by mouth. 07/20/17   [provider]     Allergies:    No Known Allergies   Physical Exam:   Vitals  Blood pressure 132/67, pulse 67, temperature 98.1 F (36.7 C), temperature  source Oral, resp. rate 16, height 5\' 11"  (1.803 m), weight 117.9 kg (260 lb), SpO2 100 %.  1.  General: Appears in no acute distress  2. Psychiatric:  Intact judgement and  insight, awake alert, oriented x 3.  3. Neurologic: No focal neurological deficits, all cranial nerves intact.Strength 5/5 all 4 extremities, sensation intact all 4 extremities, plantars down going.  4. Eyes :  anicteric sclerae, moist conjunctivae with no lid lag. PERRLA.  5. ENMT:  Oropharynx clear with moist mucous membranes and good dentition  6. Neck:  supple, no cervical lymphadenopathy appriciated, No thyromegaly  7. Respiratory : Normal respiratory effort, good air movement bilaterally,clear to  auscultation bilaterally  8. Cardiovascular : RRR, no gallops, rubs or murmurs, no leg edema  9. Gastrointestinal:  Positive bowel sounds, abdomen soft, non-tender to palpation,no hepatosplenomegaly, no rigidity or guarding       10. Skin:  No cyanosis, normal texture and turgor, no rash, lesions or ulcers  11.Musculoskeletal:  Good muscle tone,  joints appear normal , no effusions,  normal range of motion    Data Review:    CBC Recent Labs  Lab 08/04/17 1125  WBC 11.1*  HGB 12.7*  HCT 37.9*  PLT 281  MCV 90.5  MCH 30.3  MCHC 33.5  RDW 14.9   ------------------------------------------------------------------------------------------------------------------  Chemistries  Recent Labs  Lab 08/04/17 1125  NA 134*  K 5.2*  CL 93*  CO2 31  GLUCOSE 213*  BUN 40*  CREATININE 2.16*  CALCIUM 10.1   ------------------------------------------------------------------------------------------------------------------  ----------------------------------------------------------------------------------------Cardiac Enzymes: Recent Labs  Lab 08/04/17 1125  TROPONINI <0.03    --------------------------------------------------------------------------------------------------------------- Urine  analysis: No results found for: COLORURINE, APPEARANCEUR, LABSPEC, PHURINE, GLUCOSEU, HGBUR, BILIRUBINUR, KETONESUR, PROTEINUR, UROBILINOGEN, NITRITE, LEUKOCYTESUR    Imaging Results:    Dg Chest 2 View  Result Date: 08/04/2017 CLINICAL DATA:  Chest pain.  Shortness of breath . EXAM: CHEST  2 VIEW COMPARISON:  CT 03/31/2017. Chest x-ray 03/31/2017. Chest x-ray 01/22/2017 . FINDINGS: Mediastinum and hilar structures normal. Prior median sternotomy and CAD. Heart size normal. Mild left base subsegmental atelectasis. No pleural effusion or pneumothorax. Fracture of the left first rib cannot be excluded. This may be postsurgical. Adjacent pleural thickening noted. Clinical correlation suggested . IMPRESSION: 1.  Prior median sternotomy and CABG. 2. Mild left base subsegmental atelectasis . 3. Fractured left first rib cannot be excluded. This may be postsurgical. Adjacent pleural thickening noted. Clinical correlation suggested. No pneumothorax. Electronically Signed   By: Maisie Fushomas  Register   On: 08/04/2017 11:15    My personal review of EKG: Rhythm NSR, T wave abnormality in the inferior and lateral leads.   Assessment & Plan:    Active Problems:   Chest pain   1. Chest pain-resolved,  patient denies chest pain at this time.  We will placed under observation, cycle cardiac enzymes every 6 hours x3.  First set of cardiac enzymes is negative in the ED.  Will consult cardiology in a.m. for further recommendations 2. CAD status post CABG-patient underwent [facility] in [facility].  Continue aspirin, metoprolol, pravastatin. 3. Ischemic cardiomyopathy-echocardiogram shows EF 30%, patient has LifeVest in place will monitor under telemetry. 4. Diabetes mellitus-hold oral hypoglycemic agents.  Patient was recently started on Jardiance by his endocrinologist.  Will initiate sliding scale insulin with NovoLog. 5. Acute kidney injury-likely medication induced.  Patient was started on Lasix 80 mg  daily after CABG.  Also started on Jardiance recently.  Will hold lisinopril, Jardiance, Lasix, spironolactone.  Follow BMP in a.m.    DVT Prophylaxis-   Lovenox  AM Labs Ordered, also  please review Full Orders  Family Communication: Admission, patients condition and plan of care including tests being ordered have been discussed with the patient  who indicate understanding and agree with the plan and Code Status.  Code Status: Full code  Admission status: Observation  Time spent in minutes : 60 minutes   Shakerria Parran S M.D on 08/04/2017 at 6:13 PM  Between 7am to 7pm - Pager - [phone]. After 7pm go to [email or web address] - password [facility]  [facility] - Office  [phone]

## 2017-08-05 DIAGNOSIS — R002 Palpitations: Secondary | ICD-10-CM | POA: Diagnosis not present

## 2017-08-05 DIAGNOSIS — I5022 Chronic systolic (congestive) heart failure: Secondary | ICD-10-CM | POA: Diagnosis not present

## 2017-08-05 DIAGNOSIS — Z951 Presence of aortocoronary bypass graft: Secondary | ICD-10-CM | POA: Diagnosis not present

## 2017-08-05 DIAGNOSIS — I10 Essential (primary) hypertension: Secondary | ICD-10-CM

## 2017-08-05 DIAGNOSIS — I519 Heart disease, unspecified: Secondary | ICD-10-CM

## 2017-08-05 DIAGNOSIS — R079 Chest pain, unspecified: Secondary | ICD-10-CM

## 2017-08-05 DIAGNOSIS — N179 Acute kidney failure, unspecified: Secondary | ICD-10-CM | POA: Diagnosis not present

## 2017-08-05 LAB — GLUCOSE, CAPILLARY
GLUCOSE-CAPILLARY: 205 mg/dL — AB (ref 65–99)
Glucose-Capillary: 363 mg/dL — ABNORMAL HIGH (ref 65–99)
Glucose-Capillary: 184 mg/dL — ABNORMAL HIGH (ref 65–99)

## 2017-08-05 LAB — HEMOGLOBIN A1C
Hgb A1c MFr Bld: 8.6 % — ABNORMAL HIGH (ref 4.8–5.6)
MEAN PLASMA GLUCOSE: 200.12 mg/dL

## 2017-08-05 LAB — TROPONIN I
Troponin I: <0.03 ng/mL (ref <0.03)
Troponin I: <0.03 ng/mL (ref <0.03)

## 2017-08-05 MED ORDER — NITROGLYCERIN 0.4 MG SL SUBL: 0.4000 mg | SUBLINGUAL_TABLET | SUBLINGUAL | Status: DC | PRN

## 2017-08-05 NOTE — Consult Note (Signed)
Cardiology Consultation:   Patient ID: Michael Vaughn; [id number]; Oct 26, 1960   Admit date: 08/04/2017 Date of Consult: 08/05/2017  Primary Care Provider: Toma Deiters, MD Primary Cardiologist: Dr. Michae Vaughn [place] Primary Electrophysiologist:  NA   Patient Profile:   Michael Vaughn is a 56 y.o. male with a hx of recent CABG who is being seen today for the evaluation of chest pain at the request of Dr. Ardyth Vaughn.  History of Present Illness:   Michael Vaughn is a 56 year old male patient who underwent CABG times 3, 05/07/17 [facility].  He also had ischemic cardiomyopathy ejection fraction 20-25% and V. tach so is on amiodarone, beta-blocker, wearing a LifeVest.  Follow-up echo 06/23/17 EF 30%.  He also has hypertension, HLD, DM 2, sleep apnea.  Last seen by his cardiologist 06/23/17 and was doing well.  Patient complains of 1 week of worsening nausea when Januvia changed to Jardiance. He then had a fluttering sensation in his chest with some chest pain so came to ER. Says he feels chest pain if he turns a certain way and thinks it's incisional. He hasn't had any arrythmias here and life vest hasn't gone off. He is now on insulin and feeling better. Has appt for ICD eval Dec 6. K 5.2 but was 5.2 06/23/17 at [facility]. New renal insufficiency. Doesn't think he was dehydrated-was still eating and drinking a lot.  Past Medical History:  Diagnosis Date  . Anginal pain (HCC)   . 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 Bradycardia    July, 2012, related to medication  . CAD (coronary artery disease)    Some coronary irregularities by catheterization 2006 /  nuclear, July, 20 12  ,  question of some ischemia in the lateral wall although technically quite difficult.  . Diabetes mellitus   . Dyslipidemia   . Dysrhythmia   . Ejection fraction    Improved from the past  /  ejection fraction 50%, echo, July, 2012, hypokinesis at the  base of the inferior wall.  Marland Kitchen GERD (gastroesophageal reflux disease)   . Headache   . History of hiatal hernia   . Hypertension   . Lumbar disc disease   . Morbid obesity (HCC)   . Myocardial infarction (HCC)   . 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 OSA (obstructive sleep apnea)   . Shortness of breath    September, 2012    Past Surgical History:  Procedure Laterality Date  . CARDIAC CATHETERIZATION  12/20/2004   EF was 60% with normal wall motion. Nonobstructive coronary artery disease.  Marland Kitchen COLONOSCOPY    . ESOPHAGOGASTRODUODENOSCOPY    . none     no surgeries noted  . triple bipass    . WISDOM TOOTH EXTRACTION       Home Medications:  Prior to Admission medications   Medication Sig Start Date End Date Taking? Authorizing Provider  allopurinol (ZYLOPRIM) 300 MG tablet Take 300 mg by mouth daily.   Yes [provider]  amiodarone (PACERONE) 200 MG tablet Take 200 mg daily by mouth. 06/25/17  Yes [provider]  aspirin 81 MG tablet Take 162 mg daily by mouth.    Yes [provider]  empagliflozin (JARDIANCE) 10 MG TABS tablet Take 10 mg daily by mouth. 07/23/17  Yes  [provider]  furosemide (LASIX) 80 MG tablet Take 80 mg daily by mouth.   Yes [provider]  gabapentin (NEURONTIN) 600 MG tablet Take 600 mg by mouth 2 (two) times daily.    Yes [provider]  glipiZIDE (GLUCOTROL XL) 10 MG 24 hr tablet Take 10 mg by mouth daily.     Yes [provider]  HYDROcodone-acetaminophen (NORCO/VICODIN) 5-325 MG tablet Take 1 tablet every 6 (six) hours as needed by mouth for moderate pain.   Yes [provider]  ibuprofen (ADVIL,MOTRIN) 400 MG tablet Take 400 mg every 6 (six) hours as needed by mouth.   Yes [provider]  lisinopril  (PRINIVIL,ZESTRIL) 40 MG tablet Take 40 mg daily by mouth.   Yes [provider]  metFORMIN (GLUCOPHAGE) 1000 MG tablet Take 1,000 mg by mouth 2 (two) times daily with a meal.     Yes [provider]  metoprolol succinate (TOPROL-XL) 25 MG 24 hr tablet Take 37.5 mg daily by mouth. 06/25/17  Yes [provider]  Multiple Vitamin (MULTI-VITAMINS) TABS Take 1 tablet daily by mouth.   Yes [provider]  omega-3 acid ethyl esters (LOVAZA) 1 g capsule Take 1 g daily by mouth.   Yes [provider]  pravastatin (PRAVACHOL) 40 MG tablet Take 40 mg by mouth daily.     Yes [provider]  ranitidine (ZANTAC) 300 MG tablet Take 300 mg by mouth 2 (two) times daily.   Yes [provider]  senna-docusate (SENOKOT-S) 8.6-50 MG tablet Take 2 tablets at bedtime by mouth. 05/19/17  Yes [provider]  spironolactone (ALDACTONE) 25 MG tablet Take 12.5 mg daily by mouth. 06/25/17  Yes [provider]  tamsulosin (FLOMAX) 0.4 MG CAPS capsule Take 0.4 mg by mouth daily.   Yes [provider]  tadalafil (CIALIS) 20 MG tablet Take 1 tablet daily as needed by mouth. 07/20/17   [provider]    Inpatient Medications: Scheduled Meds: . allopurinol  300 mg Oral Daily  . amiodarone  200 mg Oral Daily  . aspirin  162 mg Oral Daily  . enoxaparin (LOVENOX) injection  40 mg Subcutaneous Q24H  . gabapentin  600 mg Oral BID  . Influenza vac split quadrivalent PF  0.5 mL Intramuscular Tomorrow-1000  . insulin aspart  0-9 Units Subcutaneous TID WC  . metoprolol succinate  37.5 mg Oral Daily  . omega-3 acid ethyl esters  1 g Oral Daily  . pravastatin  40 mg Oral q1800  . tamsulosin  0.4 mg Oral Daily   Continuous Infusions:  PRN Meds: HYDROcodone-acetaminophen, nitroGLYCERIN, ondansetron (ZOFRAN) IV  Allergies:   No Known Allergies  Social History:   Social History   Socioeconomic History  . Marital status: Legally  Separated    Spouse name: Not on file  . Number of children: Not on file  . Years of education: Not on file  . Highest education level: Not on file  Social Needs  . Financial resource strain: Not on file  . Food insecurity - worry: Not on file  . Food insecurity - inability: Not on file  . Transportation needs - medical: Not on file  . Transportation needs - non-medical: Not on file  Occupational History  . Occupation: DISABLED    Comment: disc disease,DM  Tobacco Use  . Smoking status: Former Smoker    Packs/day: 1.00    Years: 5.00    Pack years: 5.00    Types:  Cigarettes    Last attempt to quit: 09/22/1989    Years since quitting: 27.8  . Smokeless tobacco: Former Neurosurgeon    Types: Snuff    Quit date: 09/22/1989  . Tobacco comment: dipped snuff for one year  Substance and Sexual Activity  . Alcohol use: No  . Drug use: No  . Sexual activity: Not on file  Other Topics Concern  . Not on file  Social History Narrative   Lives in [place] with wife.     Family History:    Family History  Problem Relation Age of Onset  . Other Sister        has pacemaker  . Other Brother        has pacemaker     ROS:  Please see the history of present illness.  Review of Systems  Constitution: Positive for weakness.  HENT: Negative.   Cardiovascular: Positive for chest pain and palpitations.  Respiratory: Negative.   Endocrine: Negative.   Hematologic/Lymphatic: Negative.   Musculoskeletal: Negative.   Gastrointestinal: Positive for nausea.  Genitourinary: Negative.     All other ROS reviewed and negative.     Physical Exam/Data:   Vitals:   08/05/17 0200 08/05/17 0557 08/05/17 0758 08/05/17 0813  BP: 118/73 110/62 115/65   Pulse: 61 62 61   Resp: 19 20 18    Temp: 97.7 F (36.5 C) 97.9 F (36.6 C) 97.6 F (36.4 C)   TempSrc: Oral Oral Oral   SpO2: 99% 100% 100% 100%  Weight:      Height:        Intake/Output Summary (Last 24 hours) at 08/05/2017 0928 Last data filed  at 08/05/2017 1610 Gross per 24 hour  Intake 980 ml  Output 500 ml  Net 480 ml   Filed Weights   08/04/17 1052 08/04/17 1857  Weight: 260 lb (117.9 kg) 263 lb 9.6 oz (119.6 kg)   Body mass index is 36.76 kg/m.  General:  Well nourished, well developed, in no acute distress  HEENT: normal Lymph: no adenopathy Neck: no JVD Endocrine:  No thryomegaly Vascular: No carotid bruits; FA pulses 2+ bilaterally without bruits  Cardiac:  normal S1, S2; RRR; no murmur   Lungs:  clear to auscultation bilaterally, no wheezing, rhonchi or rales  Abd: soft, nontender, no hepatomegaly  Ext: no edema Musculoskeletal:  No deformities, BUE and BLE strength normal and equal Skin: warm and dry  Neuro:  CNs 2-12 intact, no focal abnormalities noted Psych:  Normal affect   EKG:  The EKG was personally reviewed and demonstrates: Normal sinus rhythm with poor R wave progression and T wave inversion inferior lateral no recent EKG to compare to Telemetry:  Telemetry was personally reviewed and demonstrates:  NSR without arrhythmia   Relevant CV Studies: 2D echo 06/23/17 SUMMARY The left ventricle is moderately dilated.  Left ventricular systolic function is severely reduced. LV ejection fraction = 30%. There is severe global hypokinesis of the left ventricle. The right ventricle is normal in size and function. The left atrium is mildly dilated. The right atrium is mildly dilated. There is no pericardial effusion. Compared to prior exam, LV function is probably similar. - FINDINGS:  LEFT VENTRICLE The left ventricle is moderately dilated. There is mild concentric left  ventricular hypertrophy. Left ventricular systolic function is severely  reduced. LV ejection fraction = 30%. Left ventricular filling pattern is  prolonged relaxation. There is severe global hypokinesis of the left  ventricle. -  Laboratory Data:  Chemistry Recent Labs  Lab 08/04/17 1125 08/04/17 2001  NA 134*  --   K  5.2*  --   CL 93*  --   CO2 31  --   GLUCOSE 213*  --   BUN 40*  --   CREATININE 2.16* 2.07*  CALCIUM 10.1  --   GFRNONAA 32* 34*  GFRAA 38* 40*  ANIONGAP 10  --     No results for input(s): PROT, ALBUMIN, AST, ALT, ALKPHOS, BILITOT in the last 168 hours. Hematology Recent Labs  Lab 08/04/17 1125 08/04/17 2001  WBC 11.1* 9.5  RBC 4.19* 4.01*  HGB 12.7* 12.2*  HCT 37.9* 36.2*  MCV 90.5 90.3  MCH 30.3 30.4  MCHC 33.5 33.7  RDW 14.9 14.9  PLT 281 265   Cardiac Enzymes Recent Labs  Lab 08/04/17 1125 08/04/17 2001 08/05/17 0028 08/05/17 0800  TROPONINI <0.03 <0.03 <0.03 <0.03   No results for input(s): TROPIPOC in the last 168 hours.  BNPNo results for input(s): BNP, PROBNP in the last 168 hours.  DDimer No results for input(s): DDIMER in the last 168 hours.  Radiology/Studies:  Dg Chest 2 View  Result Date: 08/04/2017 CLINICAL DATA:  Chest pain.  Shortness of breath . EXAM: CHEST  2 VIEW COMPARISON:  CT 03/31/2017. Chest x-ray 03/31/2017. Chest x-ray 01/22/2017 . FINDINGS: Mediastinum and hilar structures normal. Prior median sternotomy and CAD. Heart size normal. Mild left base subsegmental atelectasis. No pleural effusion or pneumothorax. Fracture of the left first rib cannot be excluded. This may be postsurgical. Adjacent pleural thickening noted. Clinical correlation suggested . IMPRESSION: 1.  Prior median sternotomy and CABG. 2. Mild left base subsegmental atelectasis . 3. Fractured left first rib cannot be excluded. This may be postsurgical. Adjacent pleural thickening noted. Clinical correlation suggested. No pneumothorax. Electronically Signed   By: Maisie Fus  Register   On: 08/04/2017 11:15    Assessment and Plan:   1. Chest pain troponins negative x 3.  Recommend follow-up with cardiologist at [facility] 2. CAD status post CABG times 3 05/13/17 [facility] 3. Ischemic cardiomyopathy ejection fraction 30% on recent echo 06/23/17,  wearing LifeVest 4. History of ventricular tachycardia on amiodarone, beta-blocker and LifeVest- fluttering sensation on admission but no arrhythmias documented. F/u with cardiologist. 5. Hypertension 6. Diabetes mellitus type 2 7. Hyperlipidemia 8. Renal insufficiency creatinine 2.16 and potassium 5.2 on admission.  Labs from [facility] on 06/23/17 potassium was 5.2 but creatinine 1.32.   For questions or updates, please contact [facility] Please consult [email or web address] for contact info under Cardiology/STEMI.   Signed, Jacolyn Reedy, PA-C  08/05/2017 9:28 AM   The patient was seen and examined, and I agree with the history, physical exam, assessment and plan as documented above, with modifications as noted below. I have also personally reviewed all relevant documentation, old records, labs, and both radiographic and cardiovascular studies. I have also independently interpreted old and new ECG's.  56 year old male with after mentioned history of coronary artery disease and recent CABG admitted with chest pain and palpitations.  ECG is without acute ischemic changes and chest x-ray shows no evidence of heart failure.  BUN 40, creatinine 2.16 on admission (Had been 25 and 1.32 respectively on 06/23/17).  Lisinopril, Lasix, and spironolactone currently on hold. Remains on amiodarone, ASA, Toprol-XL, and pravastatin.  Given that the chest xray shows possible fracture of left first rib and with ongoing healing of chest wall after CABG, his pains are most likely  musculoskeletal in etiology.  He denies exertional chest pain or shortness of breath.  There have been no arrhythmias on telemetry.  He will likely require a lower dose of Lasix going forward.  I recommend he have close follow-up with his cardiologist at [facility].  No noninvasive cardiac testing is indicated at this time.   Prentice Docker, MD, [facility]  08/05/2017 10:15 AM

## 2017-08-05 NOTE — Progress Notes (Signed)
Patient stated that he had placed his Bipap on and was trying to go to sleep when he got nauseated and had a " weird fluttering feeling" in his chest. Reviewed his telemetry strip and there was no change in his rhythm. Zofran given IV to help control nausea. Will continue to monitor patient.

## 2017-08-05 NOTE — Discharge Summary (Signed)
Physician Discharge Summary  ZYGMUND SIEGMANN XTA:[id number] DOB: 04/01/1961 DOA: 08/04/2017  PCP: Toma Deiters, MD  Admit date: 08/04/2017 Discharge date: 08/05/2017  Time spent: 45 minutes  Recommendations for Outpatient Follow-up:  -Will be discharged home today. -Advised to follow-up with his outpatient cardiologist within 1 week.  Discharge Diagnoses:  Active Problems:   Chest pain   Discharge Condition: Stable and improved  Filed Weights   08/04/17 1052 08/04/17 1857  Weight: 117.9 kg (260 lb) 119.6 kg (263 lb 9.6 oz)    History of present illness:  As per Dr. Sharl Ma on 11/13: Airen Motte  is a 56 y.o. male, with history of CAD, ischemic cardiomyopathy, diabetes mellitus, hypertension who recently underwent CABG x3 vessel at [facility], echocardiogram showed 30% EF.  Patient has LifeVest in place. Today came to hospital with nausea, intermittent chest pain for the past 2 days.  Patient also has been having fluttering sensation in the chest intermittently. Patient's diabetic medication Januvia was recently discontinued and he was started on Jardiance by his PCP. Patient has been urinating more frequently since starting Jardiance. In the ED lab work revealed creatinine of 2.16, his previous creatinine from August 31 was 1.31. Cardiac enzymes x1 is negative in the ED.  He denies vomiting or diarrhea No shortness of breath. Denies dysuria, urgency or frequency of urination. Denies fever or chills.    Hospital Course:   Chest Pain -Has ruled out for ACS by 3- troponins and EKG that does not demonstrate acute ischemic changes. -Has been seen in consultation by cardiology who is not recommending further cardiac testing at this time. -They relieve chest pain likely to be musculoskeletal given ongoing healing of chest wall after CABG and also possible fracture of his left first rib given reading of his chest x-ray.  He denies exertional chest  pain or shortness of breath.  There have been no arrhythmias on telemetry. -He needs close follow-up with his cardiologist at [facility].  Procedures:  None  Consultations:  Cardiology  Discharge Instructions  Discharge Instructions    Diet - low sodium heart healthy   Complete by:  As directed    Increase activity slowly   Complete by:  As directed      Allergies as of 08/05/2017   No Known Allergies     Medication List    TAKE these medications   allopurinol 300 MG tablet Commonly known as:  ZYLOPRIM Take 300 mg by mouth daily.   amiodarone 200 MG tablet Commonly known as:  PACERONE Take 200 mg daily by mouth.   aspirin 81 MG tablet Take 162 mg daily by mouth.   empagliflozin 10 MG Tabs tablet Commonly known as:  JARDIANCE Take 10 mg daily by mouth.   furosemide 80 MG tablet Commonly known as:  LASIX Take 80 mg daily by mouth.   gabapentin 600 MG tablet Commonly known as:  NEURONTIN Take 600 mg by mouth 2 (two) times daily.   glipiZIDE 10 MG 24 hr tablet Commonly known as:  GLUCOTROL XL Take 10 mg by mouth daily.   HYDROcodone-acetaminophen 5-325 MG tablet Commonly known as:  NORCO/VICODIN Take 1 tablet every 6 (six) hours as needed by mouth for moderate pain.   ibuprofen 400 MG tablet Commonly known as:  ADVIL,MOTRIN Take 400 mg every 6 (six) hours as needed by mouth.   lisinopril 40 MG tablet Commonly known as:  PRINIVIL,ZESTRIL Take 40 mg daily by mouth.   metFORMIN  1000 MG tablet Commonly known as:  GLUCOPHAGE Take 1,000 mg by mouth 2 (two) times daily with a meal.   metoprolol succinate 25 MG 24 hr tablet Commonly known as:  TOPROL-XL Take 37.5 mg daily by mouth.   MULTI-VITAMINS Tabs Take 1 tablet daily by mouth.   omega-3 acid ethyl esters 1 g capsule Commonly known as:  LOVAZA Take 1 g daily by mouth.   pravastatin 40 MG tablet Commonly known as:  PRAVACHOL Take 40 mg by mouth daily.   ranitidine 300 MG tablet Commonly known  as:  ZANTAC Take 300 mg by mouth 2 (two) times daily.   senna-docusate 8.6-50 MG tablet Commonly known as:  Senokot-S Take 2 tablets at bedtime by mouth.   spironolactone 25 MG tablet Commonly known as:  ALDACTONE Take 12.5 mg daily by mouth.   tadalafil 20 MG tablet Commonly known as:  CIALIS Take 1 tablet daily as needed by mouth.   tamsulosin 0.4 MG Caps capsule Commonly known as:  FLOMAX Take 0.4 mg by mouth daily.      No Known Allergies Follow-up Information    Toma DeitersHasanaj, Xaje A, MD. Schedule an appointment as soon as possible for a visit in 2 week(s).   Specialty:  Internal Medicine Contact information: [address] [place] [id number] [phone]            The results of significant diagnostics from this hospitalization (including imaging, microbiology, ancillary and laboratory) are listed below for reference.    Significant Diagnostic Studies: Dg Chest 2 View  Result Date: 08/04/2017 CLINICAL DATA:  Chest pain.  Shortness of breath . EXAM: CHEST  2 VIEW COMPARISON:  CT 03/31/2017. Chest x-ray 03/31/2017. Chest x-ray 01/22/2017 . FINDINGS: Mediastinum and hilar structures normal. Prior median sternotomy and CAD. Heart size normal. Mild left base subsegmental atelectasis. No pleural effusion or pneumothorax. Fracture of the left first rib cannot be excluded. This may be postsurgical. Adjacent pleural thickening noted. Clinical correlation suggested . IMPRESSION: 1.  Prior median sternotomy and CABG. 2. Mild left base subsegmental atelectasis . 3. Fractured left first rib cannot be excluded. This may be postsurgical. Adjacent pleural thickening noted. Clinical correlation suggested. No pneumothorax. Electronically Signed   By: Maisie Fushomas  Register   On: 08/04/2017 11:15    Microbiology: No results found for this or any previous visit (from the past 240 hour(s)).   Labs: Basic Metabolic Panel: Recent Labs  Lab 08/04/17 1125 08/04/17 2001  NA 134*  --   K  5.2*  --   CL 93*  --   CO2 31  --   GLUCOSE 213*  --   BUN 40*  --   CREATININE 2.16* 2.07*  CALCIUM 10.1  --    Liver Function Tests: No results for input(s): AST, ALT, ALKPHOS, BILITOT, PROT, ALBUMIN in the last 168 hours. No results for input(s): LIPASE, AMYLASE in the last 168 hours. No results for input(s): AMMONIA in the last 168 hours. CBC: Recent Labs  Lab 08/04/17 1125 08/04/17 2001  WBC 11.1* 9.5  HGB 12.7* 12.2*  HCT 37.9* 36.2*  MCV 90.5 90.3  PLT 281 265   Cardiac Enzymes: Recent Labs  Lab 08/04/17 1125 08/04/17 2001 08/05/17 0028 08/05/17 0800  TROPONINI <0.03 <0.03 <0.03 <0.03   BNP: BNP (last 3 results) No results for input(s): BNP in the last 8760 hours.  ProBNP (last 3 results) No results for input(s): PROBNP in the last 8760 hours.  CBG: Recent Labs  Lab  08/04/17 2135 08/05/17 0753 08/05/17 1131  GLUCAP 283* 205* 363*       Signed:  Chaya Jan  [facility] Pager: [phone] 08/05/2017, 4:23 PM

## 2017-08-05 NOTE — Progress Notes (Signed)
Discharge instructions read to patient .  Patient verbalized understanding of all instructions.  Discharged to home with self 

## 2017-08-06 LAB — HIV ANTIBODY (ROUTINE TESTING W REFLEX): HIV SCREEN 4TH GENERATION: NONREACTIVE

## 2017-08-12 ENCOUNTER — Encounter (HOSPITAL_COMMUNITY): Payer: Self-pay | Admitting: Emergency Medicine

## 2017-08-12 ENCOUNTER — Emergency Department (HOSPITAL_COMMUNITY): Payer: Medicaid Other

## 2017-08-12 ENCOUNTER — Observation Stay (HOSPITAL_COMMUNITY)
Admission: EM | Admit: 2017-08-12 | Discharge: 2017-08-13 | Disposition: A | Discharge status: Home / Self Care | Payer: Medicaid Other | Attending: Internal Medicine | Admitting: Internal Medicine

## 2017-08-12 ENCOUNTER — Other Ambulatory Visit: Payer: Self-pay

## 2017-08-12 DIAGNOSIS — R0602 Shortness of breath: Secondary | ICD-10-CM | POA: Insufficient documentation

## 2017-08-12 DIAGNOSIS — E119 Type 2 diabetes mellitus without complications: Secondary | ICD-10-CM | POA: Diagnosis not present

## 2017-08-12 DIAGNOSIS — R079 Chest pain, unspecified: Secondary | ICD-10-CM | POA: Diagnosis present

## 2017-08-12 DIAGNOSIS — I2510 Atherosclerotic heart disease of native coronary artery without angina pectoris: Secondary | ICD-10-CM | POA: Diagnosis present

## 2017-08-12 DIAGNOSIS — Z7982 Long term (current) use of aspirin: Secondary | ICD-10-CM | POA: Diagnosis not present

## 2017-08-12 DIAGNOSIS — I5022 Chronic systolic (congestive) heart failure: Secondary | ICD-10-CM

## 2017-08-12 DIAGNOSIS — K219 Gastro-esophageal reflux disease without esophagitis: Secondary | ICD-10-CM | POA: Diagnosis not present

## 2017-08-12 DIAGNOSIS — G4730 Sleep apnea, unspecified: Secondary | ICD-10-CM | POA: Diagnosis not present

## 2017-08-12 DIAGNOSIS — Z7902 Long term (current) use of antithrombotics/antiplatelets: Secondary | ICD-10-CM | POA: Diagnosis not present

## 2017-08-12 DIAGNOSIS — I472 Ventricular tachycardia: Secondary | ICD-10-CM | POA: Insufficient documentation

## 2017-08-12 DIAGNOSIS — Z8679 Personal history of other diseases of the circulatory system: Secondary | ICD-10-CM

## 2017-08-12 DIAGNOSIS — E785 Hyperlipidemia, unspecified: Secondary | ICD-10-CM | POA: Diagnosis present

## 2017-08-12 DIAGNOSIS — D649 Anemia, unspecified: Secondary | ICD-10-CM | POA: Diagnosis not present

## 2017-08-12 DIAGNOSIS — Z7984 Long term (current) use of oral hypoglycemic drugs: Secondary | ICD-10-CM | POA: Insufficient documentation

## 2017-08-12 DIAGNOSIS — I10 Essential (primary) hypertension: Secondary | ICD-10-CM | POA: Diagnosis present

## 2017-08-12 DIAGNOSIS — I110 Hypertensive heart disease with heart failure: Secondary | ICD-10-CM | POA: Diagnosis not present

## 2017-08-12 DIAGNOSIS — I252 Old myocardial infarction: Secondary | ICD-10-CM | POA: Diagnosis not present

## 2017-08-12 DIAGNOSIS — Z951 Presence of aortocoronary bypass graft: Secondary | ICD-10-CM | POA: Insufficient documentation

## 2017-08-12 HISTORY — DX: Benign prostatic hyperplasia without lower urinary tract symptoms: N40.0

## 2017-08-12 LAB — BASIC METABOLIC PANEL
ANION GAP: 8 (ref 5–15)
BUN: 25 mg/dL — ABNORMAL HIGH (ref 6–20)
CHLORIDE: 105 mmol/L (ref 101–111)
CO2: 24 mmol/L (ref 22–32)
Calcium: 9.1 mg/dL (ref 8.9–10.3)
Creatinine, Ser: 1.35 mg/dL — ABNORMAL HIGH (ref 0.61–1.24)
GFR calc non Af Amer: 57 mL/min — ABNORMAL LOW (ref >60)
Glucose, Bld: 221 mg/dL — ABNORMAL HIGH (ref 65–99)
POTASSIUM: 4.2 mmol/L (ref 3.5–5.1)
SODIUM: 137 mmol/L (ref 135–145)

## 2017-08-12 LAB — CBC
HEMATOCRIT: 30.8 % — AB (ref 39.0–52.0)
HEMOGLOBIN: 10.3 g/dL — AB (ref 13.0–17.0)
MCH: 30.5 pg (ref 26.0–34.0)
MCHC: 33.4 g/dL (ref 30.0–36.0)
MCV: 91.1 fL (ref 78.0–100.0)
PLATELETS: 221 10*3/uL (ref 150–400)
RBC: 3.38 MIL/uL — AB (ref 4.22–5.81)
RDW: 15.1 % (ref 11.5–15.5)
WBC: 8.7 10*3/uL (ref 4.0–10.5)

## 2017-08-12 LAB — HEPATIC FUNCTION PANEL
ALBUMIN: 3.8 g/dL (ref 3.5–5.0)
ALT: 21 U/L (ref 17–63)
AST: 18 U/L (ref 15–41)
Alkaline Phosphatase: 77 U/L (ref 38–126)
BILIRUBIN TOTAL: 0.4 mg/dL (ref 0.3–1.2)
Total Protein: 6.7 g/dL (ref 6.5–8.1)

## 2017-08-12 LAB — TROPONIN I: Troponin I: <0.03 ng/mL (ref <0.03)

## 2017-08-12 LAB — LIPASE, BLOOD: Lipase: 37 U/L (ref 11–51)

## 2017-08-12 NOTE — ED Provider Notes (Signed)
[facility] EMERGENCY DEPARTMENT Provider Note   CSN: [id number] Arrival date & time: 08/12/17  2220     History   Chief Complaint Chief Complaint  Patient presents with  . Chest Pain    HPI Michael Vaughn is a 56 y.o. male.   Mr. Michael Vaughn is a 56 year old male patient who underwent CABG times 3, 05/07/17 at [facility].  He also had ischemic cardiomyopathy ejection fraction 20-25% and V. tach so is on amiodarone, beta-blocker, wearing a LifeVest.  Follow-up echo 06/23/17 EF 30%.  He also has hypertension, HLD, DM 2, sleep apnea. Developed lower chest pain today with "needing to get some air" and nausea.  Episodes lasted about 30 minutes and resolved on own. Did not take nitro.  Had similar pain last week.  Saw cardiologist yesterday and was restarted on diuretics and told everything else was going well.  He denies chest pain at this time.  Denies shortness of breath or nausea.  No vomiting or diarrhea.  No abdominal pain or back pain.  Chest pain does not radiate.  He feels at baseline now.    The history is provided by the patient.  Chest Pain   Associated symptoms include nausea and shortness of breath. Pertinent negatives include no abdominal pain, no cough, no dizziness, no fever, no headaches, no vomiting and no weakness.    Past Medical History:  Diagnosis Date  . Anginal pain ([place])   . 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 Bradycardia    July, 2012, related to medication  . CAD (coronary artery disease)    Some coronary irregularities by catheterization 2006 /  nuclear, July, 20 12  ,  question of some ischemia in the lateral wall although technically quite difficult.  . Diabetes mellitus   . Dyslipidemia   . Dysrhythmia   . Ejection fraction    Improved from the past  /  ejection fraction 50%, echo, July, 2012, hypokinesis at the base of the inferior wall.  Marland Kitchen GERD (gastroesophageal reflux disease)   . Headache   . History of  hiatal hernia   . Hypertension   . Lumbar disc disease   . Morbid obesity ([place])   . Myocardial infarction (Michael Vaughn)   . 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 OSA (obstructive sleep apnea)   . Shortness of breath    September, 2012    Patient Active Problem List   Diagnosis Date Noted  . Chest pain 08/04/2017  . Herniated nucleus pulposus, L2-3 01/22/2016  . Sleep apnea 06/27/2011  . Shortness of breath   . Hypertension   . Dyslipidemia   . GERD (gastroesophageal reflux disease)   . Anomalous right coronary artery   . Morbid obesity ([place])   . Lumbar disc disease   . Bradycardia   . Nonischemic cardiomyopathy ([place])   . CAD (coronary artery disease)   . Ejection fraction     Past Surgical History:  Procedure Laterality Date  . CARDIAC CATHETERIZATION  12/20/2004   EF was 60% with normal wall motion. Nonobstructive coronary artery disease.  Marland Kitchen COLONOSCOPY    . ESOPHAGOGASTRODUODENOSCOPY    . LUMBAR LAMINECTOMY/ DECOMPRESSION WITH MET-RX Bilateral 01/22/2016   Procedure: Left Lumbar Two-THree Microdiskectomy and Right Lumbar Two-Three Extraforaminal with Met-RX;  Surgeon: Kristeen Miss, MD;  Location: [place]  [facility];  Service: Neurosurgery;  Laterality: Bilateral;  Left L2-3 Microdiskectomy/Right L2-3 Extraforaminal with Met-RX  . none     no surgeries noted  . triple bipass    . WISDOM TOOTH EXTRACTION         Home Medications    Prior to Admission medications   Medication Sig Start Date End Date Taking? Authorizing Provider  allopurinol (ZYLOPRIM) 300 MG tablet Take 300 mg by mouth daily.    [provider]  amiodarone (PACERONE) 200 MG tablet Take 200 mg daily by mouth. 06/25/17   [provider]  aspirin 81 MG tablet Take 162 mg daily by mouth.     [provider]  empagliflozin  (JARDIANCE) 10 MG TABS tablet Take 10 mg daily by mouth. 07/23/17   [provider]  furosemide (LASIX) 80 MG tablet Take 80 mg daily by mouth.    [provider]  gabapentin (NEURONTIN) 600 MG tablet Take 600 mg by mouth 2 (two) times daily.     [provider]  glipiZIDE (GLUCOTROL XL) 10 MG 24 hr tablet Take 10 mg by mouth daily.      [provider]  HYDROcodone-acetaminophen (NORCO/VICODIN) 5-325 MG tablet Take 1 tablet every 6 (six) hours as needed by mouth for moderate pain.    [provider]  ibuprofen (ADVIL,MOTRIN) 400 MG tablet Take 400 mg every 6 (six) hours as needed by mouth.    [provider]  lisinopril (PRINIVIL,ZESTRIL) 40 MG tablet Take 40 mg daily by mouth.    [provider]  metFORMIN (GLUCOPHAGE) 1000 MG tablet Take 1,000 mg by mouth 2 (two) times daily with a meal.      [provider]  metoprolol succinate (TOPROL-XL) 25 MG 24 hr tablet Take 37.5 mg daily by mouth. 06/25/17   [provider]  Multiple Vitamin (MULTI-VITAMINS) TABS Take 1 tablet daily by mouth.    [provider]  omega-3 acid ethyl esters (LOVAZA) 1 g capsule Take 1 g daily by mouth.    [provider]  pravastatin (PRAVACHOL) 40 MG tablet Take 40 mg by mouth daily.      [provider]  ranitidine (ZANTAC) 300 MG tablet Take 300 mg by mouth 2 (two) times daily.    [provider]  senna-docusate (SENOKOT-S) 8.6-50 MG tablet Take 2 tablets at bedtime by mouth. 05/19/17   [provider]  spironolactone (ALDACTONE) 25 MG tablet Take 12.5 mg daily by mouth. 06/25/17   [provider]  tadalafil (CIALIS) 20 MG tablet Take 1 tablet daily as needed by mouth. 07/20/17   [provider]  tamsulosin (FLOMAX) 0.4 MG CAPS capsule Take 0.4 mg by mouth daily.    [provider]    Family History Family History  Problem Relation Age of Onset  . Other Sister         has pacemaker  . Other Brother        has pacemaker    Social History Social History   Tobacco Use  . Smoking status: Former Smoker    Packs/day: 1.00    Years: 5.00    Pack years: 5.00    Types: Cigarettes    Last attempt to quit: 09/22/1989    Years since quitting: 27.9  . Smokeless tobacco: Former Systems developer    Types: Snuff    Quit date: 09/22/1989  . Tobacco comment: dipped snuff for one year  Substance Use Topics  . Alcohol use: No  .  Drug use: No     Allergies   Patient has no known allergies.   Review of Systems Review of Systems  Constitutional: Negative for activity change, appetite change and fever.  HENT: Negative for congestion and rhinorrhea.   Respiratory: Positive for chest tightness and shortness of breath. Negative for cough.   Cardiovascular: Positive for chest pain.  Gastrointestinal: Positive for nausea. Negative for abdominal pain and vomiting.  Genitourinary: Negative for dysuria and hematuria.  Musculoskeletal: Negative for arthralgias and myalgias.  Neurological: Negative for dizziness, weakness and headaches.   all other systems are negative except as noted in the HPI and PMH.     Physical Exam Updated Vital Signs BP 139/78   Pulse 60   Temp 98.1 F (36.7 C) (Oral)   Resp 18   Ht '5\' 11"'  (1.803 m)   Wt 119.3 kg (263 lb)   SpO2 99%   BMI 36.68 kg/m   Physical Exam  Constitutional: He is oriented to person, place, and time. He appears well-developed and well-nourished. No distress.  HENT:  Head: Normocephalic and atraumatic.  Mouth/Throat: Oropharynx is clear and moist. No oropharyngeal exudate.  Eyes: Conjunctivae and EOM are normal. Pupils are equal, round, and reactive to light.  Neck: Normal range of motion. Neck supple.  No meningismus.  Cardiovascular: Normal rate, regular rhythm, normal heart sounds and intact distal pulses.  No murmur heard. Pulmonary/Chest: Effort normal and breath sounds normal. No respiratory distress.    LifeVest in place.  Healing sternotomy incision  Abdominal: Soft. There is no tenderness. There is no rebound and no guarding.  Musculoskeletal: Normal range of motion. He exhibits no edema or tenderness.  Neurological: He is alert and oriented to person, place, and time. No cranial nerve deficit. He exhibits normal muscle tone. Coordination normal.  No ataxia on finger to nose bilaterally. No pronator drift. 5/5 strength throughout. CN 2-12 intact.Equal grip strength. Sensation intact.   Skin: Skin is warm. Capillary refill takes less than 2 seconds.  Psychiatric: He has a normal mood and affect. His behavior is normal.  Nursing note and vitals reviewed.    ED Treatments / Results  Labs (all labs ordered are listed, but only abnormal results are displayed) Labs Reviewed  BASIC METABOLIC PANEL - Abnormal; Notable for the following components:      Result Value   Glucose, Bld 221 (*)    BUN 25 (*)    Creatinine, Ser 1.35 (*)    GFR calc non Af Amer 57 (*)    All other components within normal limits  CBC - Abnormal; Notable for the following components:   RBC 3.38 (*)    Hemoglobin 10.3 (*)    HCT 30.8 (*)    All other components within normal limits  HEPATIC FUNCTION PANEL - Abnormal; Notable for the following components:   Bilirubin, Direct <0.1 (*)    All other components within normal limits  BRAIN NATRIURETIC PEPTIDE - Abnormal; Notable for the following components:   B Natriuretic Peptide 206.0 (*)    All other components within normal limits  TROPONIN I  LIPASE, BLOOD  TSH  TROPONIN I  TROPONIN I    EKG  EKG Interpretation  Date/Time:  Wednesday August 12 2017 22:26:20 EST Ventricular Rate:  60 PR Interval:  210 QRS Duration: 86 QT Interval:  388 QTC Calculation: 388 R Axis:   66 Text Interpretation:  Sinus rhythm with 1st degree A-V block T wave abnormality, consider inferior ischemia Abnormal ECG When  compared with ECG of 11/163/2018, No significant  change was found Confirmed by Delora Fuel ([id number]) on 08/12/2017 10:36:39 PM       Radiology Dg Chest 2 View  Result Date: 08/12/2017 CLINICAL DATA:  Epigastric pain and nausea times several days. EXAM: CHEST  2 VIEW COMPARISON:  08/04/2017 FINDINGS: The heart size and mediastinal contours are within normal limits. Status post CABG. Median sternotomy sutures are in place. Both lungs are clear. The visualized skeletal structures are unremarkable. IMPRESSION: No active cardiopulmonary disease. Electronically Signed   By: Ashley Royalty M.D.   On: 08/12/2017 23:08    Procedures Procedures (including critical care time)  Medications Ordered in ED Medications - No data to display   Initial Impression / Assessment and Plan / ED Course  I have reviewed the triage vital signs and the nursing notes.  Pertinent labs & imaging results that were available during my care of the patient were reviewed by me and considered in my medical decision making (see chart for details).    Patient with ischemic cardiomyopathy status post CABG recently presenting with episode of chest pain x2 today.  EKG is unchanged.  Patient continues to feel well.  Troponin is negative.  Creatinine has improved from previous.  Chest x-ray is negative for edema or infiltrate  LFTs and lipase unremarkable.  Vitals are stable.  With Patient's risk factors, will admit for chest pain rule out.  Discussed with Dr. Olevia Bowens. Final Clinical Impressions(s) / ED Diagnoses   Final diagnoses:  Chest pain, unspecified type    ED Discharge Orders    None       Rahil Passey, Annie Main, MD 08/13/17 0401

## 2017-08-12 NOTE — ED Triage Notes (Signed)
Pt c/o epigastric pain with nausea. Pt seen heart doctor for the same yesterday. Pt has life vest on at this time.

## 2017-08-13 ENCOUNTER — Other Ambulatory Visit: Payer: Self-pay

## 2017-08-13 ENCOUNTER — Encounter (HOSPITAL_COMMUNITY): Payer: Self-pay | Admitting: Internal Medicine

## 2017-08-13 DIAGNOSIS — R079 Chest pain, unspecified: Secondary | ICD-10-CM

## 2017-08-13 DIAGNOSIS — D649 Anemia, unspecified: Secondary | ICD-10-CM

## 2017-08-13 DIAGNOSIS — I5022 Chronic systolic (congestive) heart failure: Secondary | ICD-10-CM

## 2017-08-13 DIAGNOSIS — Z8679 Personal history of other diseases of the circulatory system: Secondary | ICD-10-CM

## 2017-08-13 DIAGNOSIS — E119 Type 2 diabetes mellitus without complications: Secondary | ICD-10-CM

## 2017-08-13 LAB — GLUCOSE, CAPILLARY
GLUCOSE-CAPILLARY: 203 mg/dL — AB (ref 65–99)
Glucose-Capillary: 194 mg/dL — ABNORMAL HIGH (ref 65–99)

## 2017-08-13 LAB — BRAIN NATRIURETIC PEPTIDE: B NATRIURETIC PEPTIDE 5: 206 pg/mL — AB (ref 0.0–100.0)

## 2017-08-13 LAB — TSH: TSH: 1.405 u[IU]/mL (ref 0.350–4.500)

## 2017-08-13 LAB — TROPONIN I

## 2017-08-13 MED ORDER — ALLOPURINOL 300 MG PO TABS
300.0000 mg | ORAL_TABLET | Freq: Every day | ORAL | Status: DC
  Administered 2017-08-13: 300 mg via ORAL
  Filled 2017-08-13: qty 1

## 2017-08-13 MED ORDER — ASPIRIN EC 81 MG PO TBEC
162.0000 mg | DELAYED_RELEASE_TABLET | Freq: Every day | ORAL | Status: DC
  Administered 2017-08-13: 162 mg via ORAL
  Filled 2017-08-13 (×2): qty 2

## 2017-08-13 MED ORDER — INSULIN ASPART 100 UNIT/ML ~~LOC~~ SOLN
0.0000 [IU] | Freq: Three times a day (TID) | SUBCUTANEOUS | Status: DC
  Administered 2017-08-13: 5 [IU] via SUBCUTANEOUS
  Administered 2017-08-13: 3 [IU] via SUBCUTANEOUS

## 2017-08-13 MED ORDER — ASPIRIN 81 MG PO CHEW
162.0000 mg | CHEWABLE_TABLET | Freq: Once | ORAL | Status: AC
  Administered 2017-08-13: 162 mg via ORAL
  Filled 2017-08-13: qty 2

## 2017-08-13 MED ORDER — FAMOTIDINE 20 MG PO TABS
40.0000 mg | ORAL_TABLET | Freq: Two times a day (BID) | ORAL | Status: DC
  Administered 2017-08-13: 40 mg via ORAL
  Filled 2017-08-13: qty 2

## 2017-08-13 MED ORDER — METFORMIN HCL 500 MG PO TABS
1000.0000 mg | ORAL_TABLET | Freq: Two times a day (BID) | ORAL | Status: DC
  Administered 2017-08-13: 1000 mg via ORAL
  Filled 2017-08-13: qty 2

## 2017-08-13 MED ORDER — ENOXAPARIN SODIUM 40 MG/0.4ML ~~LOC~~ SOLN
40.0000 mg | SUBCUTANEOUS | Status: DC
  Administered 2017-08-13: 40 mg via SUBCUTANEOUS
  Filled 2017-08-13: qty 0.4

## 2017-08-13 MED ORDER — SENNOSIDES-DOCUSATE SODIUM 8.6-50 MG PO TABS: 2.0000 | ORAL_TABLET | Freq: Every day | ORAL | Status: DC

## 2017-08-13 MED ORDER — PRAVASTATIN SODIUM 40 MG PO TABS
40.0000 mg | ORAL_TABLET | Freq: Every day | ORAL | Status: DC
  Administered 2017-08-13: 40 mg via ORAL
  Filled 2017-08-13: qty 1

## 2017-08-13 MED ORDER — ADULT MULTIVITAMIN W/MINERALS CH
1.0000 | ORAL_TABLET | Freq: Every day | ORAL | Status: DC
  Administered 2017-08-13: 1 via ORAL
  Filled 2017-08-13 (×3): qty 1

## 2017-08-13 MED ORDER — TAMSULOSIN HCL 0.4 MG PO CAPS
0.4000 mg | ORAL_CAPSULE | Freq: Every day | ORAL | Status: DC
  Administered 2017-08-13: 0.4 mg via ORAL
  Filled 2017-08-13: qty 1

## 2017-08-13 MED ORDER — GABAPENTIN 600 MG PO TABS
600.0000 mg | ORAL_TABLET | Freq: Two times a day (BID) | ORAL | Status: DC
  Filled 2017-08-13 (×3): qty 1

## 2017-08-13 MED ORDER — SPIRONOLACTONE 25 MG PO TABS
12.5000 mg | ORAL_TABLET | Freq: Every day | ORAL | Status: DC
  Administered 2017-08-13: 12.5 mg via ORAL
  Filled 2017-08-13: qty 1

## 2017-08-13 MED ORDER — AMIODARONE HCL 200 MG PO TABS
200.0000 mg | ORAL_TABLET | Freq: Every day | ORAL | Status: DC
Start: 2017-08-13 — End: 2017-08-13
  Administered 2017-08-13: 200 mg via ORAL
  Filled 2017-08-13: qty 1

## 2017-08-13 MED ORDER — FUROSEMIDE 80 MG PO TABS
80.0000 mg | ORAL_TABLET | Freq: Every day | ORAL | Status: DC
  Administered 2017-08-13: 80 mg via ORAL
  Filled 2017-08-13: qty 1

## 2017-08-13 MED ORDER — GABAPENTIN 300 MG PO CAPS
600.0000 mg | ORAL_CAPSULE | Freq: Two times a day (BID) | ORAL | Status: DC
  Administered 2017-08-13: 600 mg via ORAL
  Filled 2017-08-13: qty 2

## 2017-08-13 MED ORDER — LISINOPRIL 10 MG PO TABS
40.0000 mg | ORAL_TABLET | Freq: Every day | ORAL | Status: DC
Start: 2017-08-13 — End: 2017-08-13
  Administered 2017-08-13: 40 mg via ORAL
  Filled 2017-08-13: qty 4

## 2017-08-13 MED ORDER — ONDANSETRON HCL 4 MG/2ML IJ SOLN: 4.0000 mg | Freq: Four times a day (QID) | INTRAMUSCULAR | Status: DC | PRN

## 2017-08-13 MED ORDER — METOPROLOL SUCCINATE ER 25 MG PO TB24
37.5000 mg | ORAL_TABLET | Freq: Every day | ORAL | Status: DC
Start: 2017-08-13 — End: 2017-08-13
  Administered 2017-08-13: 37.5 mg via ORAL
  Filled 2017-08-13: qty 2

## 2017-08-13 MED ORDER — HYDROCODONE-ACETAMINOPHEN 5-325 MG PO TABS: 1.0000 | ORAL_TABLET | Freq: Four times a day (QID) | ORAL | Status: DC | PRN

## 2017-08-13 MED ORDER — ADULT MULTIVITAMIN W/MINERALS CH
1.0000 | ORAL_TABLET | Freq: Every day | ORAL | Status: DC
  Administered 2017-08-13: 1 via ORAL
  Filled 2017-08-13: qty 1

## 2017-08-13 MED ORDER — MORPHINE SULFATE (PF) 4 MG/ML IV SOLN
4.0000 mg | INTRAVENOUS | Status: DC | PRN
  Administered 2017-08-13: 4 mg via INTRAVENOUS
  Filled 2017-08-13: qty 1

## 2017-08-13 MED ORDER — GLIPIZIDE ER 5 MG PO TB24
10.0000 mg | ORAL_TABLET | Freq: Every day | ORAL | Status: DC
  Administered 2017-08-13: 10 mg via ORAL
  Filled 2017-08-13: qty 2

## 2017-08-13 MED ORDER — ACETAMINOPHEN 325 MG PO TABS: 650.0000 mg | ORAL_TABLET | Freq: Four times a day (QID) | ORAL | Status: DC | PRN

## 2017-08-13 NOTE — Discharge Summary (Signed)
Physician Discharge Summary  Michael SpringsLarry L Vaughn ZOX:[id number] DOB: 1961-07-18 DOA: 08/12/2017  PCP: Toma DeitersHasanaj, Xaje A, MD  Admit date: 08/12/2017 Discharge date: 08/13/2017  Time spent: 45 minutes  Recommendations for Outpatient Follow-up:  -To be discharged home today. -Advised to follow-up with PCP in 1 week and with cardiologist as scheduled.  Discharge Diagnoses:  Principal Problem:   Chest pain Active Problems:   Hypertension   Dyslipidemia   GERD (gastroesophageal reflux disease)   CAD (coronary artery disease)   Sleep apnea   Type 2 diabetes mellitus (HCC)   Anemia   Chronic systolic CHF (congestive heart failure) (HCC)   History of ventricular tachycardia   Discharge Condition: Stable and improved  Filed Weights   08/12/17 2227 08/13/17 0126  Weight: 119.3 kg (263 lb) 123.4 kg (272 lb)    History of present illness:  As per Dr. Robb Matarrtiz on 11/22: Michael Vaughn is a 56 y.o. male with medical history significant of  BPH, type 2 diabetes, hyperlipidemia, GERD, hypertension, chronic back pain, morbid obesity, OSA on BiPAP nightly, systolic CHF, CAD/ 3 vessel [facility]07[facility], history of bradycardia, history of V. Tach currently on amiodarone, metoprolol and he is currently wearing a LifeVest who is returning to the hospital for the second time in the past 10 days due to chest pain.  Per patient, around 2000 just before dinner, while he was sitting he experiences substernal stabbing chest pain, nonradiating, nonpleuritic, associated with lightheadedness and nausea, but denies dyspnea, palpitations and diaphoresis, which lasted for about 30 minutes and resolved on its own.  He was able to eat dinner, but had to come to the emergency department since he felt the discomfort again.  He mentions that he saw his cardiologist yesterday due to lower extremity edema and was put on furosemide 80 mg p.o. daily, which has improved his swelling.  His  cardiologist told him that he did not have any other concerns and is scheduled him for follow-up in January.  He denies fever, chills, sore throat, productive cough, wheezing, emesis, diarrhea, melena constipation or hematochezia.  He denies dysuria, frequency or hematuria.  He mentions that his blood glucose has been under control and denies polyuria, polydipsia or blurred vision..  ED Course: His EKG showed sinus rhythm with first-degree AV block and T wave abnormalities without significant change from previous ECG.  Troponin and lipase level were normal.    Hospital Course:   Chest pain -Has ruled out for ACS with negative troponins. -EKG shows lateral T wave inversions that are not new as compared to prior. -He states chest pain has resolved. -Was recently discharged about 5 days ago for the same.  Was seen by cardiology at that time, it was their belief that pain was likely due to recent thoracotomy. -He had CABG in August 2018. -He actually [facility] at [facility] on 11/20.  They resumed his Lasix.  Patient states that during his hospitalization here at [facility] his Lasix was discontinued and he thinks chest pain was attributed to fluid overload.  However review of discharge summary dated 11/14 makes no mention of Lasix being held and in fact Lasix is listed as 1 of the discharge medications.  Procedures:  None   Consultations:  None  Discharge Instructions  Discharge Instructions    Diet - low sodium heart healthy   Complete by:  As directed    Increase activity slowly   Complete by:  As  directed      Allergies as of 08/13/2017      Reactions   Jardiance [empagliflozin] Nausea Only      Medication List    TAKE these medications   allopurinol 300 MG tablet Commonly known as:  ZYLOPRIM Take 300 mg by mouth daily.   amiodarone 200 MG tablet Commonly known as:  PACERONE Take 200 mg daily by mouth.   aspirin 81 MG tablet Take 162 mg daily by mouth.     furosemide 80 MG tablet Commonly known as:  LASIX Take 80 mg daily by mouth.   gabapentin 600 MG tablet Commonly known as:  NEURONTIN Take 600 mg by mouth 2 (two) times daily.   glipiZIDE 10 MG 24 hr tablet Commonly known as:  GLUCOTROL XL Take 10 mg by mouth daily.   HYDROcodone-acetaminophen 5-325 MG tablet Commonly known as:  NORCO/VICODIN Take 1 tablet every 6 (six) hours as needed by mouth for moderate pain.   ibuprofen 400 MG tablet Commonly known as:  ADVIL,MOTRIN Take 400 mg every 6 (six) hours as needed by mouth.   lisinopril 40 MG tablet Commonly known as:  PRINIVIL,ZESTRIL Take 40 mg daily by mouth.   metoprolol succinate 25 MG 24 hr tablet Commonly known as:  TOPROL-XL Take 37.5 mg daily by mouth.   MULTI-VITAMINS Tabs Take 1 tablet daily by mouth.   omega-3 acid ethyl esters 1 g capsule Commonly known as:  LOVAZA Take 1 g daily by mouth.   pravastatin 40 MG tablet Commonly known as:  PRAVACHOL Take 40 mg by mouth daily.   ranitidine 300 MG tablet Commonly known as:  ZANTAC Take 300 mg by mouth 2 (two) times daily.   senna-docusate 8.6-50 MG tablet Commonly known as:  Senokot-S Take 2 tablets at bedtime by mouth.   spironolactone 25 MG tablet Commonly known as:  ALDACTONE Take 12.5 mg daily by mouth.   tadalafil 20 MG tablet Commonly known as:  CIALIS Take 1 tablet daily as needed by mouth.   tamsulosin 0.4 MG Caps capsule Commonly known as:  FLOMAX Take 0.4 mg by mouth daily.      Allergies  Allergen Reactions  . Jardiance [Empagliflozin] Nausea Only   Follow-up Information    Toma Deiters, MD. Schedule an appointment as soon as possible for a visit in 2 week(s).   Specialty:  Internal Medicine Contact information: [address] [place] [address] [phone]            The results of significant diagnostics from this hospitalization (including imaging, microbiology, ancillary and laboratory) are listed below for  reference.    Significant Diagnostic Studies: Dg Chest 2 View  Result Date: 08/12/2017 CLINICAL DATA:  Epigastric pain and nausea times several days. EXAM: CHEST  2 VIEW COMPARISON:  08/04/2017 FINDINGS: The heart size and mediastinal contours are within normal limits. Status post CABG. Median sternotomy sutures are in place. Both lungs are clear. The visualized skeletal structures are unremarkable. IMPRESSION: No active cardiopulmonary disease. Electronically Signed   By: Tollie Eth M.D.   On: 08/12/2017 23:08   Dg Chest 2 View  Result Date: 08/04/2017 CLINICAL DATA:  Chest pain.  Shortness of breath . EXAM: CHEST  2 VIEW COMPARISON:  CT 03/31/2017. Chest x-ray 03/31/2017. Chest x-ray 01/22/2017 . FINDINGS: Mediastinum and hilar structures normal. Prior median sternotomy and CAD. Heart size normal. Mild left base subsegmental atelectasis. No pleural effusion or pneumothorax. Fracture of the left first rib cannot be excluded.  This may be postsurgical. Adjacent pleural thickening noted. Clinical correlation suggested . IMPRESSION: 1.  Prior median sternotomy and CABG. 2. Mild left base subsegmental atelectasis . 3. Fractured left first rib cannot be excluded. This may be postsurgical. Adjacent pleural thickening noted. Clinical correlation suggested. No pneumothorax. Electronically Signed   By: Maisie Fus  Register   On: 08/04/2017 11:15    Microbiology: No results found for this or any previous visit (from the past 240 hour(s)).   Labs: Basic Metabolic Panel: Recent Labs  Lab 08/12/17 2259  NA 137  K 4.2  CL 105  CO2 24  GLUCOSE 221*  BUN 25*  CREATININE 1.35*  CALCIUM 9.1   Liver Function Tests: Recent Labs  Lab 08/12/17 2259  AST 18  ALT 21  ALKPHOS 77  BILITOT 0.4  PROT 6.7  ALBUMIN 3.8   Recent Labs  Lab 08/12/17 2259  LIPASE 37   No results for input(s): AMMONIA in the last 168 hours. CBC: Recent Labs  Lab 08/12/17 2259  WBC 8.7  HGB 10.3*  HCT 30.8*  MCV 91.1   PLT 221   Cardiac Enzymes: Recent Labs  Lab 08/12/17 2259 08/13/17 0434  TROPONINI <0.03 <0.03   BNP: BNP (last 3 results) Recent Labs    08/12/17 2259  BNP 206.0*    ProBNP (last 3 results) No results for input(s): PROBNP in the last 8760 hours.  CBG: Recent Labs  Lab 08/13/17 0723 08/13/17 1140  GLUCAP 203* 194*       Signed:  Chaya Jan  [facility] Pager: [phone] 08/13/2017, 5:24 PM

## 2017-08-13 NOTE — H&P (Signed)
4        History and Physical    Michael Vaughn OBS:[id number] DOB: Jan 29, 1961 DOA: 08/12/2017  PCP: Neale Burly, MD   Patient coming from: Home.  I have personally briefly reviewed patient's old medical records in [place]  Chief Complaint: Chest Pain.  HPI: Michael Vaughn is a 56 y.o. male with medical history significant of  BPH, type 2 diabetes, hyperlipidemia, GERD, hypertension, chronic back pain, morbid obesity, OSA on BiPAP nightly, systolic CHF, CAD/ 3 vessel CABG on 05/07/2017 at [facility], history of bradycardia, history of V. Tach currently on amiodarone, metoprolol and he is currently wearing a LifeVest who is returning to the hospital for the second time in the past 10 days due to chest pain.  Per patient, around 2000 just before dinner, while he was sitting he experiences substernal stabbing chest pain, nonradiating, nonpleuritic, associated with lightheadedness and nausea, but denies dyspnea, palpitations and diaphoresis, which lasted for about 30 minutes and resolved on its own.  He was able to eat dinner, but had to come to the emergency department since he felt the discomfort again.  He mentions that he saw his cardiologist yesterday due to lower extremity edema and was put on furosemide 80 mg p.o. daily, which has improved his swelling.  His cardiologist told him that he did not have any other concerns and is scheduled him for follow-up in January.  He denies fever, chills, sore throat, productive cough, wheezing, emesis, diarrhea, melena constipation or hematochezia.  He denies dysuria, frequency or hematuria.  He mentions that his blood glucose has been under control and denies polyuria, polydipsia or blurred vision..  ED Course: His EKG showed sinus rhythm with first-degree AV block and T wave abnormalities without significant change from previous ECG.  Troponin and lipase level were normal.  Review of Systems: As per HPI otherwise 10 point  review of systems negative.    Past Medical History:  Diagnosis Date  . Anginal pain ([place])   . 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 BPH (benign prostatic hyperplasia)   . Bradycardia    July, 2012, related to medication  . CAD (coronary artery disease)    Some coronary irregularities by catheterization 2006 /  nuclear, July, 20 12  ,  question of some ischemia in the lateral wall although technically quite difficult.  . Diabetes mellitus   . Dyslipidemia   . Dysrhythmia   . Ejection fraction    Improved from the past  /  ejection fraction 50%, echo, July, 2012, hypokinesis at the base of the inferior wall.  Marland Kitchen GERD (gastroesophageal reflux disease)   . Headache   . History of hiatal hernia   . Hypertension   . Hypertension   . Lumbar disc disease   . Morbid obesity ([place])   . Myocardial infarction ([place])   . Nonischemic cardiomyopathy ([place])    Catheterization 2000, normal coronaries, reduced ejection fraction  /  catheterization 2006 minimal scattered disease, anomalous right coronary artery from left cusp  . OSA (obstructive sleep apnea)   . Shortness of breath    September, 2012    Past Surgical History:  Procedure Laterality Date  . CARDIAC CATHETERIZATION  12/20/2004   EF was 60% with normal wall motion. Nonobstructive coronary artery disease.  Marland Kitchen COLONOSCOPY    . ESOPHAGOGASTRODUODENOSCOPY    . LUMBAR LAMINECTOMY/ DECOMPRESSION WITH MET-RX Bilateral 01/22/2016   Procedure: Left Lumbar Two-THree  Microdiskectomy and Right Lumbar Two-Three Extraforaminal with Met-RX;  Surgeon: Kristeen Miss, MD;  Location: [facility] NEURO ORS;  Service: Neurosurgery;  Laterality: Bilateral;  Left L2-3 Microdiskectomy/Right L2-3 Extraforaminal with Met-RX  . none     no surgeries noted  . triple bipass    . WISDOM TOOTH EXTRACTION       reports that he quit smoking about 27 years ago. His smoking use included cigarettes. He has a 5.00 pack-year smoking history. He quit  smokeless tobacco use about 27 years ago. His smokeless tobacco use included snuff. He reports that he does not drink alcohol or use drugs.  No Known Allergies  Family History  Problem Relation Age of Onset  . Other Sister        has pacemaker  . Other Brother        has pacemaker    Prior to Admission medications   Medication Sig Start Date End Date Taking? Authorizing Provider  allopurinol (ZYLOPRIM) 300 MG tablet Take 300 mg by mouth daily.    [provider]  amiodarone (PACERONE) 200 MG tablet Take 200 mg daily by mouth. 06/25/17   [provider]  aspirin 81 MG tablet Take 162 mg daily by mouth.     [provider]  empagliflozin (JARDIANCE) 10 MG TABS tablet Take 10 mg daily by mouth. 07/23/17   [provider]  furosemide (LASIX) 80 MG tablet Take 80 mg daily by mouth.    [provider]  gabapentin (NEURONTIN) 600 MG tablet Take 600 mg by mouth 2 (two) times daily.     [provider]  glipiZIDE (GLUCOTROL XL) 10 MG 24 hr tablet Take 10 mg by mouth daily.      [provider]  HYDROcodone-acetaminophen (NORCO/VICODIN) 5-325 MG tablet Take 1 tablet every 6 (six) hours as needed by mouth for moderate pain.    [provider]  ibuprofen (ADVIL,MOTRIN) 400 MG tablet Take 400 mg every 6 (six) hours as needed by mouth.    [provider]  lisinopril (PRINIVIL,ZESTRIL) 40 MG tablet Take 40 mg daily by mouth.    [provider]  metFORMIN (GLUCOPHAGE) 1000 MG tablet Take 1,000 mg by mouth 2 (two) times daily with a meal.      [provider]  metoprolol succinate (TOPROL-XL) 25 MG 24 hr tablet Take 37.5 mg daily by mouth. 06/25/17   [provider]  Multiple Vitamin (MULTI-VITAMINS) TABS Take 1 tablet daily by mouth.    [provider]  omega-3 acid ethyl esters (LOVAZA) 1 g capsule Take 1 g daily by mouth.    [provider]  pravastatin (PRAVACHOL) 40 MG tablet  Take 40 mg by mouth daily.      [provider]  ranitidine (ZANTAC) 300 MG tablet Take 300 mg by mouth 2 (two) times daily.    [provider]  senna-docusate (SENOKOT-S) 8.6-50 MG tablet Take 2 tablets at bedtime by mouth. 05/19/17   [provider]  spironolactone (ALDACTONE) 25 MG tablet Take 12.5 mg daily by mouth. 06/25/17   [provider]  tadalafil (CIALIS) 20 MG tablet Take 1 tablet daily as needed by mouth. 07/20/17   [provider]  tamsulosin (FLOMAX) 0.4 MG CAPS capsule Take 0.4 mg by mouth daily.    [provider]    Physical Exam: Vitals:   08/12/17 2228 08/12/17 2321 08/12/17 2330 08/13/17 0000  BP: 139/78 129/61 125/63 (!) 142/66  Pulse: 60 (!) 57 (!) 57 Marland Kitchen)  59  Resp: _0 Temp: 98.1 F (36.7 C)     TempSrc: Oral     SpO2: 99% 99% 97% 99%  Weight:      Height:        Constitutional: NAD, calm, comfortable Eyes: PERRL, lids and conjunctivae normal ENMT: Mucous membranes are moist. Posterior pharynx clear of any exudate or lesions. Neck: normal, supple, no masses, no thyromegaly Respiratory: clear to auscultation bilaterally, no wheezing, no crackles. Normal respiratory effort. No accessory muscle use.  Cardiovascular: LifeVest in place.  Regular rate and rhythm, no murmurs / rubs / gallops.  Trace lower extremities edema. 2+ pedal pulses. No carotid bruits.  Abdomen: Obese, soft, no tenderness, no masses palpated. No hepatosplenomegaly. Bowel sounds positive.  Musculoskeletal: no clubbing / cyanosis. No joint deformity upper and lower extremities. Good ROM, no contractures. Normal muscle tone.  Skin: Healing sternotomy incision. Neurologic: CN 2-12 grossly intact. Sensation intact, DTR normal. Strength 5/5 in all 4.  Psychiatric: Normal judgment and insight. Alert and oriented x 3. Normal mood.    Labs on Admission: I have personally reviewed following labs and imaging studies  CBC: Recent Labs  Lab  08/12/17 2259  WBC 8.7  HGB 10.3*  HCT 30.8*  MCV 91.1  PLT 929   Basic Metabolic Panel: Recent Labs  Lab 08/12/17 2259  NA 137  K 4.2  CL 105  CO2 24  GLUCOSE 221*  BUN 25*  CREATININE 1.35*  CALCIUM 9.1   GFR: Estimated Creatinine Clearance: 80.3 mL/min (A) (by C-G formula based on SCr of 1.35 mg/dL (H)). Liver Function Tests: Recent Labs  Lab 08/12/17 2259  AST 18  ALT 21  ALKPHOS 77  BILITOT 0.4  PROT 6.7  ALBUMIN 3.8   Recent Labs  Lab 08/12/17 2259  LIPASE 37   No results for input(s): AMMONIA in the last 168 hours. Coagulation Profile: No results for input(s): INR, PROTIME in the last 168 hours. Cardiac Enzymes: Recent Labs  Lab 08/12/17 2259  TROPONINI <0.03   BNP (last 3 results) No results for input(s): PROBNP in the last 8760 hours. HbA1C: No results for input(s): HGBA1C in the last 72 hours. CBG: No results for input(s): GLUCAP in the last 168 hours. Lipid Profile: No results for input(s): CHOL, HDL, LDLCALC, TRIG, CHOLHDL, LDLDIRECT in the last 72 hours. Thyroid Function Tests: No results for input(s): TSH, T4TOTAL, FREET4, T3FREE, THYROIDAB in the last 72 hours. Anemia Panel: No results for input(s): VITAMINB12, FOLATE, FERRITIN, TIBC, IRON, RETICCTPCT in the last 72 hours. Urine analysis: No results found for: COLORURINE, APPEARANCEUR, LABSPEC, [place], GLUCOSEU, HGBUR, BILIRUBINUR, KETONESUR, PROTEINUR, UROBILINOGEN, NITRITE, LEUKOCYTESUR  Radiological Exams on Admission: Dg Chest 2 View  Result Date: 08/12/2017 CLINICAL DATA:  Epigastric pain and nausea times several days. EXAM: CHEST  2 VIEW COMPARISON:  08/04/2017 FINDINGS: The heart size and mediastinal contours are within normal limits. Status post CABG. Median sternotomy sutures are in place. Both lungs are clear. The visualized skeletal structures are unremarkable. IMPRESSION: No active cardiopulmonary disease. Electronically Signed   By: Ashley Royalty M.D.   On: 08/12/2017 23:08     EKG: Independently reviewed.  Vent. rate 60 BPM PR interval 210 ms QRS duration 86 ms QT/QTc 388/388 ms P-R-T axes 65 66 194 Sinus rhythm with 1st degree A-V block T wave abnormality, consider inferior ischemia Abnormal ECG When compared with ECG of 11/163/2018, No major change when compared to previous EKG.  Assessment/Plan Principal Problem:   Chest  pain Observation/telemetry. The patient declined nitroglycerin due to headache. Trend troponin levels. Follow-up EKG. Continue aspirin. Morphine sulfate for chest pain as needed. Continue aspirin 162 mg p.o. daily. Continue metoprolol ER 37.5 mg p.o. daily  Active Problems:   CAD (coronary artery disease) Continue aspirin, metoprolol and pravastatin. Follow-up with cardiology in January as scheduled.    Chronic systolic CHF (congestive heart failure) (HCC) No signs of decompensation at this time. Continue furosemide 80 mg p.o. daily. Continue lisinopril 40 mg p.o. daily. Continue metoprolol ER 37.5 mg p.o. daily. Continue spironolactone 25 mg p.o. daily.    History of ventricular tachycardia Continue amiodarone 200 mg p.o. daily. Continue metoprolol ER 37.5 mg p.o. daily. Currently wearing a LifeVest.    Hypertension Continue lisinopril, furosemide, metoprolol and spironolactone. Monitor blood pressure, renal function and electrolytes.    Dyslipidemia Continue pravastatin 40 mg p.o. daily. Monitor LFTs as needed. Follow-up lipid panel as an outpatient.    GERD (gastroesophageal reflux disease) Protonix 40 mg p.o. daily.    Sleep apnea Continue BiPAP at bedtime.    Type 2 diabetes mellitus (HCC) Carbohydrate modified diet.    Anemia Monitor hematocrit and hemoglobin.     DVT prophylaxis: Lovenox SQ. Code Status: Full code. Family Communication: His sister was present in the ED. Disposition Plan: Observation for cardiac telemetry and troponin level trending. Consults called:  Admission status:  Observation/telemetry.   Reubin Milan MD [facility] Pager [phone].  If 7PM-7AM, please contact night-coverage [email or web address] Password [facility]  08/13/2017, 12:38 AM   This document was prepared using Dragon voice recognition software and may contain some unintended dictation errors.

## 2017-08-13 NOTE — ED Notes (Signed)
Report attempted 

## 2017-08-13 NOTE — Progress Notes (Signed)
BIPAP not started per pt

## 2017-08-13 NOTE — ED Notes (Signed)
admitting Provider at bedside. 

## 2017-08-13 NOTE — Progress Notes (Signed)
Patient Dc'd home.  IV removed - WNL.  Reviewed AVS and meds.  Instructed to follow up with PCP in 2 weeks and to seek medical attn for return of s/s.  Patient verbalizes understanding.  Patient in NAD at this time, awaiting wife for pick up

## 2017-12-19 LAB — GLUCOSE, POCT (MANUAL RESULT ENTRY): POC Glucose: 165 mg/dl — AB (ref 70–99)

## 2017-12-19 LAB — POCT GLYCOSYLATED HEMOGLOBIN (HGB A1C)

## 2018-04-29 ENCOUNTER — Encounter (HOSPITAL_COMMUNITY)
Admission: RE | Admit: 2018-04-29 | Discharge: 2018-04-29 | Disposition: A | Discharge status: Home / Self Care | Payer: Medicaid Other | Source: Ambulatory Visit | Attending: Internal Medicine | Admitting: Internal Medicine

## 2018-04-29 VITALS — BP 150/80 | HR 64 | Ht 71.0 in | Wt 283.0 lb

## 2018-04-29 DIAGNOSIS — I252 Old myocardial infarction: Secondary | ICD-10-CM | POA: Insufficient documentation

## 2018-04-29 DIAGNOSIS — I10 Essential (primary) hypertension: Secondary | ICD-10-CM | POA: Insufficient documentation

## 2018-04-29 DIAGNOSIS — E119 Type 2 diabetes mellitus without complications: Secondary | ICD-10-CM | POA: Insufficient documentation

## 2018-04-29 DIAGNOSIS — G4733 Obstructive sleep apnea (adult) (pediatric): Secondary | ICD-10-CM | POA: Insufficient documentation

## 2018-04-29 DIAGNOSIS — Z794 Long term (current) use of insulin: Secondary | ICD-10-CM | POA: Insufficient documentation

## 2018-04-29 DIAGNOSIS — E785 Hyperlipidemia, unspecified: Secondary | ICD-10-CM | POA: Insufficient documentation

## 2018-04-29 DIAGNOSIS — Z79899 Other long term (current) drug therapy: Secondary | ICD-10-CM | POA: Insufficient documentation

## 2018-04-29 DIAGNOSIS — Z7982 Long term (current) use of aspirin: Secondary | ICD-10-CM | POA: Insufficient documentation

## 2018-04-29 DIAGNOSIS — I428 Other cardiomyopathies: Secondary | ICD-10-CM | POA: Insufficient documentation

## 2018-04-29 DIAGNOSIS — Z87891 Personal history of nicotine dependence: Secondary | ICD-10-CM | POA: Insufficient documentation

## 2018-04-29 DIAGNOSIS — Z6839 Body mass index (BMI) 39.0-39.9, adult: Secondary | ICD-10-CM | POA: Insufficient documentation

## 2018-04-29 DIAGNOSIS — I2510 Atherosclerotic heart disease of native coronary artery without angina pectoris: Secondary | ICD-10-CM | POA: Insufficient documentation

## 2018-04-29 DIAGNOSIS — M519 Unspecified thoracic, thoracolumbar and lumbosacral intervertebral disc disorder: Secondary | ICD-10-CM | POA: Insufficient documentation

## 2018-04-29 DIAGNOSIS — K219 Gastro-esophageal reflux disease without esophagitis: Secondary | ICD-10-CM | POA: Insufficient documentation

## 2018-04-29 DIAGNOSIS — N400 Enlarged prostate without lower urinary tract symptoms: Secondary | ICD-10-CM | POA: Insufficient documentation

## 2018-04-30 ENCOUNTER — Encounter (HOSPITAL_COMMUNITY): Payer: Self-pay

## 2018-04-30 NOTE — Progress Notes (Signed)
Cardiac Individual Treatment Plan  Patient Details  Name: Michael Vaughn MRN: [id number] Date of Birth: Dec 04, 1960 Referring Provider:     CARDIAC REHAB PHASE II ORIENTATION from 04/29/2018 in [facility] CARDIAC REHABILITATION  Referring Provider  Dr. Alfonso Ellis      Initial Encounter Date:    CARDIAC REHAB PHASE II ORIENTATION from 04/29/2018 in [facility] CARDIAC REHABILITATION  Date  04/29/18      Visit Diagnosis: Cardiomyopathy, nonischemic (HCC)   Patient's Home Medications on Admission:  Current Outpatient Medications:  .  allopurinol (ZYLOPRIM) 300 MG tablet, Take 300 mg by mouth daily., Disp: , Rfl:  .  amiodarone (PACERONE) 200 MG tablet, Take 200 mg daily by mouth., Disp: , Rfl:  .  aspirin 81 MG tablet, Take 81 mg by mouth daily. , Disp: , Rfl:  .  furosemide (LASIX) 80 MG tablet, Take 60 mg by mouth daily. , Disp: , Rfl:  .  glipiZIDE (GLUCOTROL XL) 10 MG 24 hr tablet, Take 10 mg by mouth daily.  , Disp: , Rfl:  .  insulin glargine (LANTUS) 100 UNIT/ML injection, Inject 14 Units into the skin daily., Disp: , Rfl:  .  insulin glargine (LANTUS) 100 UNIT/ML injection, Inject 10 Units into the skin at bedtime., Disp: , Rfl:  .  lisinopril (PRINIVIL,ZESTRIL) 40 MG tablet, Take 40 mg daily by mouth., Disp: , Rfl:  .  metFORMIN (GLUCOPHAGE) 1000 MG tablet, Take 1,000 mg by mouth 2 (two) times daily with a meal., Disp: , Rfl:  .  metoprolol succinate (TOPROL-XL) 25 MG 24 hr tablet, Take 37.5 mg daily by mouth., Disp: , Rfl:  .  Multiple Vitamin (MULTI-VITAMINS) TABS, Take 1 tablet daily by mouth., Disp: , Rfl:  .  omeprazole (PRILOSEC) 20 MG capsule, Take 20 mg by mouth daily., Disp: , Rfl:  .  pravastatin (PRAVACHOL) 40 MG tablet, Take 40 mg by mouth daily.  , Disp: , Rfl:  .  senna-docusate (SENOKOT-S) 8.6-50 MG tablet, Take 2 tablets at bedtime by mouth., Disp: , Rfl:  .  spironolactone (ALDACTONE) 25 MG tablet, Take 12.5 mg daily by mouth., Disp: , Rfl:  .  tadalafil (CIALIS) 20 MG  tablet, Take 1 tablet daily as needed by mouth., Disp: , Rfl: 11 .  tamsulosin (FLOMAX) 0.4 MG CAPS capsule, Take 0.4 mg by mouth daily., Disp: , Rfl:  .  gabapentin (NEURONTIN) 600 MG tablet, Take 600 mg by mouth 2 (two) times daily. , Disp: , Rfl:  .  HYDROcodone-acetaminophen (NORCO/VICODIN) 5-325 MG tablet, Take 1 tablet every 6 (six) hours as needed by mouth for moderate pain., Disp: , Rfl:  .  ibuprofen (ADVIL,MOTRIN) 400 MG tablet, Take 400 mg every 6 (six) hours as needed by mouth., Disp: , Rfl:  .  omega-3 acid ethyl esters (LOVAZA) 1 g capsule, Take 1 g daily by mouth., Disp: , Rfl:  .  ranitidine (ZANTAC) 300 MG tablet, Take 300 mg by mouth 2 (two) times daily., Disp: , Rfl:   Past Medical History: Past Medical History:  Diagnosis Date  . Anginal pain (HCC)   . 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 BPH (benign prostatic hyperplasia)   . Bradycardia    July, 2012, related to medication  . CAD (coronary artery disease)    Some coronary irregularities by catheterization 2006 /  nuclear, July, 20 12  ,  question of some ischemia in the lateral wall although technically quite difficult.  . Diabetes  mellitus   . Dyslipidemia   . Dysrhythmia   . Ejection fraction    Improved from the past  /  ejection fraction 50%, echo, July, 2012, hypokinesis at the base of the inferior wall.  Marland Kitchen GERD (gastroesophageal reflux disease)   . Headache   . History of hiatal hernia   . Hypertension   . Hypertension   . Lumbar disc disease   . Morbid obesity (HCC)   . Myocardial infarction (HCC)   . 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 OSA (obstructive sleep apnea)   . Shortness of breath    September, 2012    Tobacco Use: Social History   Tobacco Use  Smoking Status Former Smoker  . Packs/day: 1.00  .  Years: 5.00  . Pack years: 5.00  . Types: Cigarettes  . Last attempt to quit: 09/22/1989  . Years since quitting: 28.6  Smokeless Tobacco Former Neurosurgeon  . Types: Snuff  . Quit date: 09/22/1989  Tobacco Comment   dipped snuff for one year    Labs: Recent Review Flowsheet Data    Labs for ITP Cardiac and Pulmonary Rehab 01/14/2016 01/22/2016 08/04/2017 12/19/2017   Hemoglobin A1c 8.2(H) 7.6(H) 8.6(H) 9.0%      Capillary Blood Glucose: Lab Results  Component Value Date   GLUCAP 194 (H) 08/13/2017   GLUCAP 203 (H) 08/13/2017   GLUCAP 184 (H) 08/05/2017   GLUCAP 363 (H) 08/05/2017   GLUCAP 205 (H) 08/05/2017     Exercise Target Goals: Exercise Program Goal: Individual exercise prescription set using results from initial 6 min walk test and THRR while considering  patient's activity barriers and safety.   Exercise Prescription Goal: Starting with aerobic activity 30 plus minutes a day, 3 days per week for initial exercise prescription. Provide home exercise prescription and guidelines that participant acknowledges understanding prior to discharge.  Activity Barriers & Risk Stratification: Activity Barriers & Cardiac Risk Stratification - 04/30/18 1706      Activity Barriers & Cardiac Risk Stratification   Activity Barriers  Back Problems;Assistive Device;Balance Concerns;Deconditioning   (R) leg weakness   Cardiac Risk Stratification  High       6 Minute Walk: 6 Minute Walk    Row Name 04/29/18 1702         6 Minute Walk   Phase  Initial     Distance  1150 feet     Walk Time  6 minutes     # of Rest Breaks  0     MPH  2.17     METS  2.66     RPE  11     Perceived Dyspnea   9     VO2 Peak  8.53     Symptoms  No     Resting HR  64 bpm     Resting BP  150/80     Resting Oxygen Saturation   98 %     Exercise Oxygen Saturation  during 6 min walk  98 %     Max Ex. HR  78 bpm     Max Ex. BP  174/92     2 Minute Post BP  168/88        Oxygen Initial  Assessment:   Oxygen Re-Evaluation:   Oxygen Discharge (Final Oxygen Re-Evaluation):   Initial Exercise Prescription: Initial Exercise Prescription - 04/30/18 1700  Date of Initial Exercise RX and Referring Provider   Date  04/29/18    Referring Provider  Dr. Alfonso Ellis    Expected Discharge Date  08/09/18      Treadmill   MPH  1    Grade  0    Minutes  17    METs  1.7      Arm Ergometer   Level  1    Watts  11    RPM  52    Minutes  17    METs  1.8      T5 Nustep   Level  1    SPM  54    Minutes  17    METs  1.7      Prescription Details   Frequency (times per week)  3    Duration  Progress to 30 minutes of continuous aerobic without signs/symptoms of physical distress      Intensity   THRR 40-80% of Max Heartrate  [phone]    Ratings of Perceived Exertion  11-13    Perceived Dyspnea  0-4      Progression   Progression  Continue progressive overload as per policy without signs/symptoms or physical distress.      Resistance Training   Training Prescription  Yes    Weight  1    Reps  10-15       Perform Capillary Blood Glucose checks as needed.  Exercise Prescription Changes:   Exercise Comments:   Exercise Goals and Review:  Exercise Goals    Row Name 04/30/18 1715             Exercise Goals   Increase Physical Activity  Yes       Intervention  Develop an individualized exercise prescription for aerobic and resistive training based on initial evaluation findings, risk stratification, comorbidities and participant's personal goals.;Provide advice, education, support and counseling about physical activity/exercise needs.       Expected Outcomes  Short Term: Attend rehab on a regular basis to increase amount of physical activity.;Long Term: Exercising regularly at least 3-5 days a week.       Increase Strength and Stamina  Yes       Intervention  Provide advice, education, support and counseling about physical activity/exercise  needs.;Develop an individualized exercise prescription for aerobic and resistive training based on initial evaluation findings, risk stratification, comorbidities and participant's personal goals.       Expected Outcomes  Short Term: Perform resistance training exercises routinely during rehab and add in resistance training at home;Short Term: Increase workloads from initial exercise prescription for resistance, speed, and METs.;Long Term: Improve cardiorespiratory fitness, muscular endurance and strength as measured by increased METs and functional capacity ( )       Able to understand and use rate of perceived exertion (RPE) scale  Yes       Intervention  Provide education and explanation on how to use RPE scale       Expected Outcomes  Short Term: Able to use RPE daily in rehab to express subjective intensity level;Long Term:  Able to use RPE to guide intensity level when exercising independently       Knowledge and understanding of Target Heart Rate Range (THRR)  Yes       Intervention  Provide education and explanation of THRR including how the numbers were predicted and where they are located for reference       Expected Outcomes  Short Term: Able to state/look up  THRR;Long Term: Able to use THRR to govern intensity when exercising independently       Able to check pulse independently  Yes       Intervention  Provide education and demonstration on how to check pulse in carotid and radial arteries.;Review the importance of being able to check your own pulse for safety during independent exercise       Expected Outcomes  Short Term: Able to explain why pulse checking is important during independent exercise;Long Term: Able to check pulse independently and accurately       Understanding of Exercise Prescription  Yes       Intervention  Provide education, explanation, and written materials on patient's individual exercise prescription       Expected Outcomes  Short Term: Able to explain program  exercise prescription;Long Term: Able to explain home exercise prescription to exercise independently          Exercise Goals Re-Evaluation :    Discharge Exercise Prescription (Final Exercise Prescription Changes):   Nutrition:  Target Goals: Understanding of nutrition guidelines, daily intake of sodium 1500mg , cholesterol 200mg , calories 30% from fat and 7% or less from saturated fats, daily to have 5 or more servings of fruits and vegetables.  Biometrics: Pre Biometrics - 04/30/18 1718      Pre Biometrics   Height  5\' 11"  (1.803 m)    Weight  283 lb (128.4 kg)    Waist Circumference  53 inches    Hip Circumference  43 inches    Waist to Hip Ratio  1.23 %    BMI (Calculated)  39.49    Triceps Skinfold  20 mm    % Body Fat  38.4 %    Grip Strength  29.8 kg   kilograms   Flexibility  10.2 in    Single Leg Stand  38 seconds        Nutrition Therapy Plan and Nutrition Goals: Nutrition Therapy & Goals - 04/30/18 1723      Personal Nutrition Goals   Personal Goal #2  Patient is eating low fat, low sodium and diabetic diet.     Additional Goals?  No      Intervention Plan   Expected Outcomes  Short Term Goal: Understand basic principles of dietary content, such as calories, fat, sodium, cholesterol and nutrients.;Long Term Goal: Adherence to prescribed nutrition plan.       Nutrition Assessments: Nutrition Assessments - 04/30/18 1724      MEDFICTS Scores   Pre Score  24       Nutrition Goals Re-Evaluation:   Nutrition Goals Discharge (Final Nutrition Goals Re-Evaluation):   Psychosocial: Target Goals: Acknowledge presence or absence of significant depression and/or stress, maximize coping skills, provide positive support system. Participant is able to verbalize types and ability to use techniques and skills needed for reducing stress and depression.  Initial Review & Psychosocial Screening: Initial Psych Review & Screening - 04/30/18 1721      Initial  Review   Current issues with  None Identified      Family Dynamics   Good Support System?  Yes      Barriers   Psychosocial barriers to participate in program  There are no identifiable barriers or psychosocial needs.      Screening Interventions   Interventions  Encouraged to exercise    Expected Outcomes  Short Term goal: Identification and review with participant of any Quality of Life or Depression concerns found by scoring the questionnaire.;Long  Term goal: The participant improves quality of Life and PHQ9 Scores as seen by post scores and/or verbalization of changes       Quality of Life Scores: Quality of Life - 04/30/18 1722      Quality of Life   Select  Quality of Life      Quality of Life Scores   Health/Function Pre  19.29 %    Socioeconomic Pre  18 %    Psych/Spiritual Pre  24.86 %    Family Pre  24 %    GLOBAL Pre  20.91 %      Scores of 19 and below usually indicate a poorer quality of life in these areas.  A difference of  2-3 points is a clinically meaningful difference.  A difference of 2-3 points in the total score of the Quality of Life Index has been associated with significant improvement in overall quality of life, self-image, physical symptoms, and general health in studies assessing change in quality of life.  PHQ-9: Recent Review Flowsheet Data    There is no flowsheet data to display.     Interpretation of Total Score  Total Score Depression Severity:  1-4 = Minimal depression, 5-9 = Mild depression, 10-14 = Moderate depression, 15-19 = Moderately severe depression, 20-27 = Severe depression   Psychosocial Evaluation and Intervention: Psychosocial Evaluation - 04/30/18 1722      Psychosocial Evaluation & Interventions   Interventions  Encouraged to exercise with the program and follow exercise prescription    Continue Psychosocial Services   No Follow up required       Psychosocial Re-Evaluation:   Psychosocial Discharge (Final  Psychosocial Re-Evaluation):   Vocational Rehabilitation: Provide vocational rehab assistance to qualifying candidates.   Vocational Rehab Evaluation & Intervention: Vocational Rehab - 04/30/18 1725      Initial Vocational Rehab Evaluation & Intervention   Assessment shows need for Vocational Rehabilitation  No   Disabled      Education: Education Goals: Education classes will be provided on a weekly basis, covering required topics. Participant will state understanding/return demonstration of topics presented.  Learning Barriers/Preferences: Learning Barriers/Preferences - 04/30/18 1724      Learning Barriers/Preferences   Learning Barriers  None    Learning Preferences  Individual Instruction;Group Instruction       Education Topics: Hypertension, Hypertension Reduction -Define heart disease and high blood pressure. Discus how high blood pressure affects the body and ways to reduce high blood pressure.   Exercise and Your Heart -Discuss why it is important to exercise, the FITT principles of exercise, normal and abnormal responses to exercise, and how to exercise safely.   Angina -Discuss definition of angina, causes of angina, treatment of angina, and how to decrease risk of having angina.   Cardiac Medications -Review what the following cardiac medications are used for, how they affect the body, and side effects that may occur when taking the medications.  Medications include Aspirin, Beta blockers, calcium channel blockers, ACE Inhibitors, angiotensin receptor blockers, diuretics, digoxin, and antihyperlipidemics.   Congestive Heart Failure -Discuss the definition of CHF, how to live with CHF, the signs and symptoms of CHF, and how keep track of weight and sodium intake.   Heart Disease and Intimacy -Discus the effect sexual activity has on the heart, how changes occur during intimacy as we age, and safety during sexual activity.   Smoking Cessation /  COPD -Discuss different methods to quit smoking, the health benefits of quitting smoking, and the definition of  COPD.   Nutrition I: Fats -Discuss the types of cholesterol, what cholesterol does to the heart, and how cholesterol levels can be controlled.   Nutrition II: Labels -Discuss the different components of food labels and how to read food label   Heart Parts/Heart Disease and PAD -Discuss the anatomy of the heart, the pathway of blood circulation through the heart, and these are affected by heart disease.   Stress I: Signs and Symptoms -Discuss the causes of stress, how stress may lead to anxiety and depression, and ways to limit stress.   Stress II: Relaxation -Discuss different types of relaxation techniques to limit stress.   Warning Signs of Stroke / TIA -Discuss definition of a stroke, what the signs and symptoms are of a stroke, and how to identify when someone is having stroke.   Knowledge Questionnaire Score: Knowledge Questionnaire Score - 04/30/18 1725      Knowledge Questionnaire Score   Pre Score  21/24       Core Components/Risk Factors/Patient Goals at Admission: Personal Goals and Risk Factors at Admission - 04/30/18 1725      Core Components/Risk Factors/Patient Goals on Admission    Weight Management  Yes    Intervention  Weight Management/Obesity: Establish reasonable short term and long term weight goals.    Admit Weight  283 lb (128.4 kg)    Goal Weight: Short Term  273 lb (123.8 kg)    Goal Weight: Long Term  263 lb (119.3 kg)    Expected Outcomes  Short Term: Continue to assess and modify interventions until short term weight is achieved;Long Term: Adherence to nutrition and physical activity/exercise program aimed toward attainment of established weight goal    Personal Goal Other  Yes    Personal Goal  Improve activity, Lose 40 lbs overall. Will work at losing 15-20lbs while in program.     Intervention  Attend program 3 x week and  supplement exercise at home 2 x week.     Expected Outcomes  Reach personal goals.        Core Components/Risk Factors/Patient Goals Review:    Core Components/Risk Factors/Patient Goals at Discharge (Final Review):    ITP Comments: ITP Comments    Row Name 04/30/18 1519           ITP Comments  Patient was sent from [facility]. 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 He is eager to get started.           Comments: Patient arrived for 1st visit/orientation/education at 1330. Patient was referred to CR by Dr. Alfonso Ellis at [facility] due to Cardiomyopathy (I42.8). During orientation advised patient on arrival and appointment times what to wear, what to do before, during and after exercise. Reviewed attendance and class policy. Talked about inclement weather and class consultation policy. Pt is scheduled to return Cardiac Rehab on 05/05/18 at 11:00. Pt was advised to come to class 15 minutes before class starts. Patient was also given instructions on meeting with the dietician and attending the Family Structure classes. Discussed RPE/Dpysnea scales. Discussed initial THR and how to find their radial and/or carotid pulse. Discussed the initial exercise prescription and how this effects their progress. Pt is eager to get started. Patient participated in warm up stretches followed by light weights and resistance bands. Patient was able to complete 6 minute walk test. Patient had push a wheelchair during test for balance. Patient did not c/o pain. Patient was measured for the equipment. Discussed equipment safety  with patient. Took patient pre-anthropometric measurements. Patient finished visit at 1700.

## 2018-05-03 NOTE — Progress Notes (Signed)
Cardiac Individual Treatment Plan  Patient Details  Name: Michael Vaughn MRN: [id number] Date of Birth: 11-Jan-1961 Referring Provider:     CARDIAC REHAB PHASE II ORIENTATION from 04/29/2018 in [facility] CARDIAC REHABILITATION  Referring Provider  Dr. Alfonso Ellis      Initial Encounter Date:    CARDIAC REHAB PHASE II ORIENTATION from 04/29/2018 in [facility] CARDIAC REH[facility]ILITATION  Date  04/29/18      Visit Diagnosis: Cardiomyopathy, nonischemic (HCC)  Patient's Home Medications on Admission:  Current Outpatient Medications:  .  allopurinol (ZYLOPRIM) 300 MG tablet, Take 300 mg by mouth daily., Disp: , Rfl:  .  amiodarone (PACERONE) 200 MG tablet, Take 200 mg daily by mouth., Disp: , Rfl:  .  aspirin 81 MG tablet, Take 81 mg by mouth daily. , Disp: , Rfl:  .  furosemide (LASIX) 80 MG tablet, Take 60 mg by mouth daily. , Disp: , Rfl:  .  glipiZIDE (GLUCOTROL XL) 10 MG 24 hr tablet, Take 10 mg by mouth daily.  , Disp: , Rfl:  .  insulin glargine (LANTUS) 100 UNIT/ML injection, Inject 14 Units into the skin daily., Disp: , Rfl:  .  insulin glargine (LANTUS) 100 UNIT/ML injection, Inject 10 Units into the skin at bedtime., Disp: , Rfl:  .  lisinopril (PRINIVIL,ZESTRIL) 40 MG tablet, Take 40 mg daily by mouth., Disp: , Rfl:  .  metFORMIN (GLUCOPHAGE) 1000 MG tablet, Take 1,000 mg by mouth 2 (two) times daily with a meal., Disp: , Rfl:  .  metoprolol succinate (TOPROL-XL) 25 MG 24 hr tablet, Take 37.5 mg daily by mouth., Disp: , Rfl:  .  Multiple Vitamin (MULTI-VITAMINS) TABS, Take 1 tablet daily by mouth., Disp: , Rfl:  .  omeprazole (PRILOSEC) 20 MG capsule, Take 20 mg by mouth daily., Disp: , Rfl:  .  pravastatin (PRAVACHOL) 40 MG tablet, Take 40 mg by mouth daily.  , Disp: , Rfl:  .  senna-docusate (SENOKOT-S) 8.6-50 MG tablet, Take 2 tablets at bedtime by mouth., Disp: , Rfl:  .  spironolactone (ALDACTONE) 25 MG tablet, Take 12.5 mg daily by mouth., Disp: , Rfl:  .  tadalafil (CIALIS) 20 MG  tablet, Take 1 tablet daily as needed by mouth., Disp: , Rfl: 11 .  tamsulosin (FLOMAX) 0.4 MG CAPS capsule, Take 0.4 mg by mouth daily., Disp: , Rfl:  .  gabapentin (NEURONTIN) 600 MG tablet, Take 600 mg by mouth 2 (two) times daily. , Disp: , Rfl:  .  HYDROcodone-acetaminophen (NORCO/VICODIN) 5-325 MG tablet, Take 1 tablet every 6 (six) hours as needed by mouth for moderate pain., Disp: , Rfl:  .  ibuprofen (ADVIL,MOTRIN) 400 MG tablet, Take 400 mg every 6 (six) hours as needed by mouth., Disp: , Rfl:  .  omega-3 acid ethyl esters (LOVAZA) 1 g capsule, Take 1 g daily by mouth., Disp: , Rfl:  .  ranitidine (ZANTAC) 300 MG tablet, Take 300 mg by mouth 2 (two) times daily., Disp: , Rfl:   Past Medical History: Past Medical History:  Diagnosis Date  . Anginal pain (HCC)   . 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 BPH (benign prostatic hyperplasia)   . Bradycardia    July, 2012, related to medication  . CAD (coronary artery disease)    Some coronary irregularities by catheterization 2006 /  nuclear, July, 20 12  ,  question of some ischemia in the lateral wall although technically quite difficult.  . Diabetes mellitus   .  Dyslipidemia   . Dysrhythmia   . Ejection fraction    Improved from the past  /  ejection fraction 50%, echo, July, 2012, hypokinesis at the base of the inferior wall.  Marland Kitchen GERD (gastroesophageal reflux disease)   . Headache   . History of hiatal hernia   . Hypertension   . Hypertension   . Lumbar disc disease   . Morbid obesity (HCC)   . Myocardial infarction (HCC)   . 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 OSA (obstructive sleep apnea)   . Shortness of breath    September, 2012    Tobacco Use: Social History   Tobacco Use  Smoking Status Former Smoker  . Packs/day: 1.00  .  Years: 5.00  . Pack years: 5.00  . Types: Cigarettes  . Last attempt to quit: 09/22/1989  . Years since quitting: 28.6  Smokeless Tobacco Former Neurosurgeon  . Types: Snuff  . Quit date: 09/22/1989  Tobacco Comment   dipped snuff for one year    Labs: Recent Review Flowsheet Data    Labs for ITP Cardiac and Pulmonary Rehab 01/14/2016 01/22/2016 08/04/2017 12/19/2017   Hemoglobin A1c 8.2(H) 7.6(H) 8.6(H) 9.0%      Capillary Blood Glucose: Lab Results  Component Value Date   GLUCAP 194 (H) 08/13/2017   GLUCAP 203 (H) 08/13/2017   GLUCAP 184 (H) 08/05/2017   GLUCAP 363 (H) 08/05/2017   GLUCAP 205 (H) 08/05/2017     Exercise Target Goals: Exercise Program Goal: Individual exercise prescription set using results from initial 6 min walk test and THRR while considering  patient's activity barriers and safety.   Exercise Prescription Goal: Starting with aerobic activity 30 plus minutes a day, 3 days per week for initial exercise prescription. Provide home exercise prescription and guidelines that participant acknowledges understanding prior to discharge.  Activity Barriers & Risk Stratification: Activity Barriers & Cardiac Risk Stratification - 04/30/18 1706      Activity Barriers & Cardiac Risk Stratification   Activity Barriers  Back Problems;Assistive Device;Balance Concerns;Deconditioning   (R) leg weakness   Cardiac Risk Stratification  High       6 Minute Walk: 6 Minute Walk    Row Name 04/29/18 1702         6 Minute Walk   Phase  Initial     Distance  1150 feet     Walk Time  6 minutes     # of Rest Breaks  0     MPH  2.17     METS  2.66     RPE  11     Perceived Dyspnea   9     VO2 Peak  8.53     Symptoms  No     Resting HR  64 bpm     Resting BP  150/80     Resting Oxygen Saturation   98 %     Exercise Oxygen Saturation  during 6 min walk  98 %     Max Ex. HR  78 bpm     Max Ex. BP  174/92     2 Minute Post BP  168/88        Oxygen Initial  Assessment:   Oxygen Re-Evaluation:   Oxygen Discharge (Final Oxygen Re-Evaluation):   Initial Exercise Prescription: Initial Exercise Prescription - 04/30/18 1700  Date of Initial Exercise RX and Referring Provider   Date  04/29/18    Referring Provider  Dr. Alfonso Ellis    Expected Discharge Date  08/09/18      Treadmill   MPH  1    Grade  0    Minutes  17    METs  1.7      Arm Ergometer   Level  1    Watts  11    RPM  52    Minutes  17    METs  1.8      T5 Nustep   Level  1    SPM  54    Minutes  17    METs  1.7      Prescription Details   Frequency (times per week)  3    Duration  Progress to 30 minutes of continuous aerobic without signs/symptoms of physical distress      Intensity   THRR 40-80% of Max Heartrate  [phone]    Ratings of Perceived Exertion  11-13    Perceived Dyspnea  0-4      Progression   Progression  Continue progressive overload as per policy without signs/symptoms or physical distress.      Resistance Training   Training Prescription  Yes    Weight  1    Reps  10-15       Perform Capillary Blood Glucose checks as needed.  Exercise Prescription Changes:   Exercise Comments:   Exercise Goals and Review:  Exercise Goals    Row Name 04/30/18 1715             Exercise Goals   Increase Physical Activity  Yes       Intervention  Develop an individualized exercise prescription for aerobic and resistive training based on initial evaluation findings, risk stratification, comorbidities and participant's personal goals.;Provide advice, education, support and counseling about physical activity/exercise needs.       Expected Outcomes  Short Term: Attend rehab on a regular basis to increase amount of physical activity.;Long Term: Exercising regularly at least 3-5 days a week.       Increase Strength and Stamina  Yes       Intervention  Provide advice, education, support and counseling about physical activity/exercise  needs.;Develop an individualized exercise prescription for aerobic and resistive training based on initial evaluation findings, risk stratification, comorbidities and participant's personal goals.       Expected Outcomes  Short Term: Perform resistance training exercises routinely during rehab and add in resistance training at home;Short Term: Increase workloads from initial exercise prescription for resistance, speed, and METs.;Long Term: Improve cardiorespiratory fitness, muscular endurance and strength as measured by increased METs and functional capacity ( )       Able to understand and use rate of perceived exertion (RPE) scale  Yes       Intervention  Provide education and explanation on how to use RPE scale       Expected Outcomes  Short Term: Able to use RPE daily in rehab to express subjective intensity level;Long Term:  Able to use RPE to guide intensity level when exercising independently       Knowledge and understanding of Target Heart Rate Range (THRR)  Yes       Intervention  Provide education and explanation of THRR including how the numbers were predicted and where they are located for reference       Expected Outcomes  Short Term: Able to state/look up  THRR;Long Term: Able to use THRR to govern intensity when exercising independently       Able to check pulse independently  Yes       Intervention  Provide education and demonstration on how to check pulse in carotid and radial arteries.;Review the importance of being able to check your own pulse for safety during independent exercise       Expected Outcomes  Short Term: Able to explain why pulse checking is important during independent exercise;Long Term: Able to check pulse independently and accurately       Understanding of Exercise Prescription  Yes       Intervention  Provide education, explanation, and written materials on patient's individual exercise prescription       Expected Outcomes  Short Term: Able to explain program  exercise prescription;Long Term: Able to explain home exercise prescription to exercise independently          Exercise Goals Re-Evaluation :    Discharge Exercise Prescription (Final Exercise Prescription Changes):   Nutrition:  Target Goals: Understanding of nutrition guidelines, daily intake of sodium 1500mg , cholesterol 200mg , calories 30% from fat and 7% or less from saturated fats, daily to have 5 or more servings of fruits and vegetables.  Biometrics: Pre Biometrics - 04/30/18 1718      Pre Biometrics   Height  5\' 11"  (1.803 m)    Weight  128.4 kg    Waist Circumference  53 inches    Hip Circumference  43 inches    Waist to Hip Ratio  1.23 %    BMI (Calculated)  39.49    Triceps Skinfold  20 mm    % Body Fat  38.4 %    Grip Strength  29.8 kg   kilograms   Flexibility  10.2 in    Single Leg Stand  38 seconds        Nutrition Therapy Plan and Nutrition Goals: Nutrition Therapy & Goals - 04/30/18 1723      Personal Nutrition Goals   Personal Goal #2  Patient is eating low fat, low sodium and diabetic diet.     Additional Goals?  No      Intervention Plan   Expected Outcomes  Short Term Goal: Understand basic principles of dietary content, such as calories, fat, sodium, cholesterol and nutrients.;Long Term Goal: Adherence to prescribed nutrition plan.       Nutrition Assessments: Nutrition Assessments - 04/30/18 1724      MEDFICTS Scores   Pre Score  24       Nutrition Goals Re-Evaluation:   Nutrition Goals Discharge (Final Nutrition Goals Re-Evaluation):   Psychosocial: Target Goals: Acknowledge presence or absence of significant depression and/or stress, maximize coping skills, provide positive support system. Participant is able to verbalize types and ability to use techniques and skills needed for reducing stress and depression.  Initial Review & Psychosocial Screening: Initial Psych Review & Screening - 04/30/18 1721      Initial Review    Current issues with  None Identified      Family Dynamics   Good Support System?  Yes      Barriers   Psychosocial barriers to participate in program  There are no identifiable barriers or psychosocial needs.      Screening Interventions   Interventions  Encouraged to exercise    Expected Outcomes  Short Term goal: Identification and review with participant of any Quality of Life or Depression concerns found by scoring the questionnaire.;Long Term goal:  The participant improves quality of Life and PHQ9 Scores as seen by post scores and/or verbalization of changes       Quality of Life Scores: Quality of Life - 04/30/18 1722      Quality of Life   Select  Quality of Life      Quality of Life Scores   Health/Function Pre  19.29 %    Socioeconomic Pre  18 %    Psych/Spiritual Pre  24.86 %    Family Pre  24 %    GLOBAL Pre  20.91 %      Scores of 19 and below usually indicate a poorer quality of life in these areas.  A difference of  2-3 points is a clinically meaningful difference.  A difference of 2-3 points in the total score of the Quality of Life Index has been associated with significant improvement in overall quality of life, self-image, physical symptoms, and general health in studies assessing change in quality of life.  PHQ-9: Recent Review Flowsheet Data    There is no flowsheet data to display.     Interpretation of Total Score  Total Score Depression Severity:  1-4 = Minimal depression, 5-9 = Mild depression, 10-14 = Moderate depression, 15-19 = Moderately severe depression, 20-27 = Severe depression   Psychosocial Evaluation and Intervention: Psychosocial Evaluation - 04/30/18 1722      Psychosocial Evaluation & Interventions   Interventions  Encouraged to exercise with the program and follow exercise prescription    Continue Psychosocial Services   No Follow up required       Psychosocial Re-Evaluation:   Psychosocial Discharge (Final Psychosocial  Re-Evaluation):   Vocational Rehabilitation: Provide vocational rehab assistance to qualifying candidates.   Vocational Rehab Evaluation & Intervention: Vocational Rehab - 04/30/18 1725      Initial Vocational Rehab Evaluation & Intervention   Assessment shows need for Vocational Rehabilitation  No   Disabled      Education: Education Goals: Education classes will be provided on a weekly basis, covering required topics. Participant will state understanding/return demonstration of topics presented.  Learning Barriers/Preferences: Learning Barriers/Preferences - 04/30/18 1724      Learning Barriers/Preferences   Learning Barriers  None    Learning Preferences  Individual Instruction;Group Instruction       Education Topics: Hypertension, Hypertension Reduction -Define heart disease and high blood pressure. Discus how high blood pressure affects the body and ways to reduce high blood pressure.   Exercise and Your Heart -Discuss why it is important to exercise, the FITT principles of exercise, normal and abnormal responses to exercise, and how to exercise safely.   Angina -Discuss definition of angina, causes of angina, treatment of angina, and how to decrease risk of having angina.   Cardiac Medications -Review what the following cardiac medications are used for, how they affect the body, and side effects that may occur when taking the medications.  Medications include Aspirin, Beta blockers, calcium channel blockers, ACE Inhibitors, angiotensin receptor blockers, diuretics, digoxin, and antihyperlipidemics.   Congestive Heart Failure -Discuss the definition of CHF, how to live with CHF, the signs and symptoms of CHF, and how keep track of weight and sodium intake.   Heart Disease and Intimacy -Discus the effect sexual activity has on the heart, how changes occur during intimacy as we age, and safety during sexual activity.   Smoking Cessation / COPD -Discuss  different methods to quit smoking, the health benefits of quitting smoking, and the definition of COPD.  Nutrition I: Fats -Discuss the types of cholesterol, what cholesterol does to the heart, and how cholesterol levels can be controlled.   Nutrition II: Labels -Discuss the different components of food labels and how to read food label   Heart Parts/Heart Disease and PAD -Discuss the anatomy of the heart, the pathway of blood circulation through the heart, and these are affected by heart disease.   Stress I: Signs and Symptoms -Discuss the causes of stress, how stress may lead to anxiety and depression, and ways to limit stress.   Stress II: Relaxation -Discuss different types of relaxation techniques to limit stress.   Warning Signs of Stroke / TIA -Discuss definition of a stroke, what the signs and symptoms are of a stroke, and how to identify when someone is having stroke.   Knowledge Questionnaire Score: Knowledge Questionnaire Score - 04/30/18 1725      Knowledge Questionnaire Score   Pre Score  21/24       Core Components/Risk Factors/Patient Goals at Admission: Personal Goals and Risk Factors at Admission - 04/30/18 1725      Core Components/Risk Factors/Patient Goals on Admission    Weight Management  Yes    Intervention  Weight Management/Obesity: Establish reasonable short term and long term weight goals.    Admit Weight  283 lb (128.4 kg)    Goal Weight: Short Term  273 lb (123.8 kg)    Goal Weight: Long Term  263 lb (119.3 kg)    Expected Outcomes  Short Term: Continue to assess and modify interventions until short term weight is achieved;Long Term: Adherence to nutrition and physical activity/exercise program aimed toward attainment of established weight goal    Personal Goal Other  Yes    Personal Goal  Improve activity, Lose 40 lbs overall. Will work at losing 15-20lbs while in program.     Intervention  Attend program 3 x week and supplement exercise  at home 2 x week.     Expected Outcomes  Reach personal goals.        Core Components/Risk Factors/Patient Goals Review:    Core Components/Risk Factors/Patient Goals at Discharge (Final Review):    ITP Comments: ITP Comments    Row Name 04/30/18 1519 05/03/18 0734         ITP Comments  Patient was sent from [facility]. 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 He is eager to get started.   Patient new to program. Plans to start Wednesday 05/05/18.         Comments: ITP 30 Day REVIEW Patient new to program. Plans to start Wednesday 05/05/18.

## 2018-05-05 ENCOUNTER — Encounter (HOSPITAL_COMMUNITY)
Admission: RE | Admit: 2018-05-05 | Discharge: 2018-05-05 | Disposition: A | Discharge status: Home / Self Care | Payer: Medicaid Other | Source: Ambulatory Visit | Attending: Internal Medicine | Admitting: Internal Medicine

## 2018-05-05 DIAGNOSIS — G4733 Obstructive sleep apnea (adult) (pediatric): Secondary | ICD-10-CM | POA: Diagnosis not present

## 2018-05-05 DIAGNOSIS — I2510 Atherosclerotic heart disease of native coronary artery without angina pectoris: Secondary | ICD-10-CM | POA: Diagnosis not present

## 2018-05-05 DIAGNOSIS — I428 Other cardiomyopathies: Secondary | ICD-10-CM

## 2018-05-05 DIAGNOSIS — E119 Type 2 diabetes mellitus without complications: Secondary | ICD-10-CM | POA: Diagnosis not present

## 2018-05-05 DIAGNOSIS — I10 Essential (primary) hypertension: Secondary | ICD-10-CM | POA: Diagnosis not present

## 2018-05-05 DIAGNOSIS — E785 Hyperlipidemia, unspecified: Secondary | ICD-10-CM | POA: Diagnosis not present

## 2018-05-05 DIAGNOSIS — Z79899 Other long term (current) drug therapy: Secondary | ICD-10-CM | POA: Diagnosis not present

## 2018-05-05 DIAGNOSIS — K219 Gastro-esophageal reflux disease without esophagitis: Secondary | ICD-10-CM | POA: Diagnosis not present

## 2018-05-05 DIAGNOSIS — Z87891 Personal history of nicotine dependence: Secondary | ICD-10-CM | POA: Diagnosis not present

## 2018-05-05 DIAGNOSIS — N400 Enlarged prostate without lower urinary tract symptoms: Secondary | ICD-10-CM | POA: Diagnosis not present

## 2018-05-05 DIAGNOSIS — Z7982 Long term (current) use of aspirin: Secondary | ICD-10-CM | POA: Diagnosis not present

## 2018-05-05 DIAGNOSIS — M519 Unspecified thoracic, thoracolumbar and lumbosacral intervertebral disc disorder: Secondary | ICD-10-CM | POA: Diagnosis not present

## 2018-05-05 DIAGNOSIS — Z794 Long term (current) use of insulin: Secondary | ICD-10-CM | POA: Diagnosis not present

## 2018-05-05 DIAGNOSIS — Z6839 Body mass index (BMI) 39.0-39.9, adult: Secondary | ICD-10-CM | POA: Diagnosis not present

## 2018-05-05 DIAGNOSIS — I252 Old myocardial infarction: Secondary | ICD-10-CM | POA: Diagnosis not present

## 2018-05-05 NOTE — Progress Notes (Signed)
Daily Session Note  Patient Details  Name: Michael Vaughn MRN: [id number] Date of Birth: [birth year] Referring Provider:     CARDIAC REHAB PHASE II ORIENTATION from 04/29/2018 in [place]  Referring Provider  [name]      Encounter Date: 05/05/2018  Check In: Session Check In - 05/05/18 1100      Check-In   Supervising physician immediately available to respond to emergencies  See telemetry face sheet for immediately available MD    Location  [facility]    Staff Present  Russella Dar, MS, EP, [facility], Exercise Physiologist;Kevion Fatheree Wynetta Emery, RN, BSN    Medication changes reported      No    Fall or balance concerns reported     Yes    Warm-up and Cool-down  Performed as group-led instruction    Resistance Training Performed  Yes    VAD Patient?  No    PAD/SET Patient?  No      Pain Assessment   Currently in Pain?  No/denies    Pain Score  0-No pain    Multiple Pain Sites  No       Capillary Blood Glucose: No results found for this or any previous visit (from the past 24 hour(s)).    Social History   Tobacco Use  Smoking Status Former Smoker  . Packs/day: 1.00  . Years: 5.00  . Pack years: 5.00  . Types: Cigarettes  . Last attempt to quit: 09/22/1989  . Years since quitting: 28.6  Smokeless Tobacco Former Systems developer  . Types: Snuff  . Quit date: 09/22/1989  Tobacco Comment   dipped snuff for one year    Goals Met:  Independence with exercise equipment Exercise tolerated well No report of cardiac concerns or symptoms Strength training completed today  Goals Unmet:  Not Applicable  Comments: Check out 1200.   Dr. Kate Sable is Medical Director for [facility] Cardiac and Pulmonary Rehab.

## 2018-05-07 ENCOUNTER — Encounter (HOSPITAL_COMMUNITY)
Admission: RE | Admit: 2018-05-07 | Discharge: 2018-05-07 | Disposition: A | Discharge status: Home / Self Care | Payer: Medicaid Other | Source: Ambulatory Visit | Attending: Internal Medicine | Admitting: Internal Medicine

## 2018-05-07 DIAGNOSIS — I428 Other cardiomyopathies: Secondary | ICD-10-CM

## 2018-05-07 NOTE — Progress Notes (Signed)
Daily Session Note  Patient Details  Name: Michael Vaughn MRN: [id number] Date of Birth: 08-24-1961 Referring Provider:     CARDIAC REHAB PHASE II ORIENTATION from 04/29/2018 in [place]  Referring Provider  [name]      Encounter Date: 05/07/2018  Check In: Session Check In - 05/07/18 1100      Check-In   Supervising physician immediately available to respond to emergencies  See telemetry face sheet for immediately available MD    Location  [facility]    Staff Present  Russella Dar, MS, EP, [facility], Exercise Physiologist;Debra Wynetta Emery, RN, BSN    Medication changes reported      No    Fall or balance concerns reported     Yes    Comments  Due to his back surgery his (R) leg gets weak and numb.     Tobacco Cessation  No Change    Warm-up and Cool-down  Performed as group-led instruction    Resistance Training Performed  Yes    VAD Patient?  No    PAD/SET Patient?  No      Pain Assessment   Currently in Pain?  No/denies    Pain Score  0-No pain    Multiple Pain Sites  No       Capillary Blood Glucose: No results found for this or any previous visit (from the past 24 hour(s)).    Social History   Tobacco Use  Smoking Status Former Smoker  . Packs/day: 1.00  . Years: 5.00  . Pack years: 5.00  . Types: Cigarettes  . Last attempt to quit: 09/22/1989  . Years since quitting: 28.6  Smokeless Tobacco Former Systems developer  . Types: Snuff  . Quit date: 09/22/1989  Tobacco Comment   dipped snuff for one year    Goals Met:  Independence with exercise equipment Exercise tolerated well Personal goals reviewed No report of cardiac concerns or symptoms Strength training completed today  Goals Unmet:  Not Applicable  Comments: Pt able to follow exercise prescription today without complaint.  Will continue to monitor for progression. Check out: 12:00   Dr. Kate Sable is Medical Director for [facility] and Pulmonary Rehab.

## 2018-05-10 ENCOUNTER — Encounter (HOSPITAL_COMMUNITY)
Admission: RE | Admit: 2018-05-10 | Discharge: 2018-05-10 | Disposition: A | Discharge status: Home / Self Care | Payer: Medicaid Other | Source: Ambulatory Visit | Attending: Internal Medicine | Admitting: Internal Medicine

## 2018-05-10 DIAGNOSIS — I428 Other cardiomyopathies: Secondary | ICD-10-CM | POA: Diagnosis not present

## 2018-05-10 NOTE — Progress Notes (Signed)
Daily Session Note  Patient Details  Name: Michael Vaughn MRN: [id number] Date of Birth: 01/08/1961 Referring Provider:     CARDIAC REHAB PHASE II ORIENTATION from 04/29/2018 in [place] CARDIAC REHABILITATION  Referring Provider  Dr. Beaty      Encounter Date: 05/10/2018  Check In: Session Check In - 05/10/18 1100      Check-In   Supervising physician immediately available to respond to emergencies  See telemetry face sheet for immediately available MD    Location  [facility]    Staff Present  Diane Coad, MS, EP, [facility], Exercise Physiologist;Debra Johnson, RN, BSN    Medication changes reported      No    Fall or balance concerns reported     Yes    Comments  Due to his back surgery his (R) leg gets weak and numb.     Tobacco Cessation  No Change    Warm-up and Cool-down  Performed as group-led instruction    Resistance Training Performed  Yes    VAD Patient?  No    PAD/SET Patient?  No      Pain Assessment   Currently in Pain?  No/denies    Pain Score  0-No pain    Multiple Pain Sites  No       Capillary Blood Glucose: No results found for this or any previous visit (from the past 24 hour(s)).    Social History   Tobacco Use  Smoking Status Former Smoker  . Packs/day: 1.00  . Years: 5.00  . Pack years: 5.00  . Types: Cigarettes  . Last attempt to quit: 09/22/1989  . Years since quitting: 28.6  Smokeless Tobacco Former User  . Types: Snuff  . Quit date: 09/22/1989  Tobacco Comment   dipped snuff for one year    Goals Met:  Independence with exercise equipment Exercise tolerated well Personal goals reviewed No report of cardiac concerns or symptoms Strength training completed today  Goals Unmet:  Not Applicable  Comments: Pt able to follow exercise prescription today without complaint.  Will continue to monitor for progression. Check out; 1200   Dr. Suresh Koneswaran is Medical Director for [place] Cardiac and Pulmonary Rehab. 

## 2018-05-12 ENCOUNTER — Encounter (HOSPITAL_COMMUNITY): Payer: Medicaid Other

## 2018-05-14 ENCOUNTER — Encounter (HOSPITAL_COMMUNITY)
Admission: RE | Admit: 2018-05-14 | Discharge: 2018-05-14 | Disposition: A | Discharge status: Home / Self Care | Payer: Medicaid Other | Source: Ambulatory Visit | Attending: Internal Medicine | Admitting: Internal Medicine

## 2018-05-14 DIAGNOSIS — I428 Other cardiomyopathies: Secondary | ICD-10-CM | POA: Diagnosis not present

## 2018-05-14 NOTE — Progress Notes (Signed)
Daily Session Note  Patient Details  Name: Michael Vaughn MRN: [id number] Date of Birth: 1961/01/19 Referring Provider:     CARDIAC REHAB PHASE II ORIENTATION from 04/29/2018 in [place]  Referring Provider  [name]      Encounter Date: 05/14/2018  Check In: Session Check In - 05/14/18 1100      Check-In   Supervising physician immediately available to respond to emergencies  See telemetry face sheet for immediately available MD    Location  [facility]    Staff Present  Russella Dar, MS, EP, [facility], Exercise Physiologist;Kanyon Seibold Wynetta Emery, RN, BSN    Medication changes reported      No    Fall or balance concerns reported     Yes    Comments  Due to his back surgery his (R) leg gets weak and numb.     Warm-up and Cool-down  Performed as group-led Higher education careers adviser Performed  Yes    VAD Patient?  No    PAD/SET Patient?  No      Pain Assessment   Currently in Pain?  No/denies    Pain Score  0-No pain    Multiple Pain Sites  No       Capillary Blood Glucose: No results found for this or any previous visit (from the past 24 hour(s)).    Social History   Tobacco Use  Smoking Status Former Smoker  . Packs/day: 1.00  . Years: 5.00  . Pack years: 5.00  . Types: Cigarettes  . Last attempt to quit: 09/22/1989  . Years since quitting: 28.6  Smokeless Tobacco Former Systems developer  . Types: Snuff  . Quit date: 09/22/1989  Tobacco Comment   dipped snuff for one year    Goals Met:  Independence with exercise equipment Exercise tolerated well No report of cardiac concerns or symptoms Strength training completed today  Goals Unmet:  Not Applicable  Comments: Pt able to follow exercise prescription today without complaint.  Will continue to monitor for progression. Check out 1200.   Dr. Kate Sable is Medical Director for [facility] Cardiac and Pulmonary Rehab.

## 2018-05-17 ENCOUNTER — Encounter (HOSPITAL_COMMUNITY)
Admission: RE | Admit: 2018-05-17 | Discharge: 2018-05-17 | Disposition: A | Discharge status: Home / Self Care | Payer: Medicaid Other | Source: Ambulatory Visit | Attending: Internal Medicine | Admitting: Internal Medicine

## 2018-05-17 DIAGNOSIS — I428 Other cardiomyopathies: Secondary | ICD-10-CM | POA: Diagnosis not present

## 2018-05-17 NOTE — Progress Notes (Signed)
Daily Session Note  Patient Details  Name: Michael Vaughn MRN: [id number] Date of Birth: October 26, 1960 Referring Provider:     CARDIAC REHAB PHASE II ORIENTATION from 04/29/2018 in [place]  Referring Provider  [name]      Encounter Date: 05/17/2018  Check In: Session Check In - 05/17/18 1100      Check-In   Supervising physician immediately available to respond to emergencies  See telemetry face sheet for immediately available MD    Location  [facility]    Staff Present  Russella Dar, MS, EP, [facility], Exercise Physiologist;Debra Wynetta Emery, RN, BSN    Medication changes reported      No    Fall or balance concerns reported     Yes    Comments  Due to his back surgery his (R) leg gets weak and numb.     Tobacco Cessation  No Change    Warm-up and Cool-down  Performed as group-led instruction    Resistance Training Performed  Yes    VAD Patient?  No    PAD/SET Patient?  No      Pain Assessment   Currently in Pain?  No/denies    Pain Score  0-No pain    Multiple Pain Sites  No       Capillary Blood Glucose: No results found for this or any previous visit (from the past 24 hour(s)).    Social History   Tobacco Use  Smoking Status Former Smoker  . Packs/day: 1.00  . Years: 5.00  . Pack years: 5.00  . Types: Cigarettes  . Last attempt to quit: 09/22/1989  . Years since quitting: 28.6  Smokeless Tobacco Former Systems developer  . Types: Snuff  . Quit date: 09/22/1989  Tobacco Comment   dipped snuff for one year    Goals Met:  Independence with exercise equipment Exercise tolerated well Personal goals reviewed No report of cardiac concerns or symptoms Strength training completed today  Goals Unmet:  Not Applicable  Comments: Pt able to follow exercise prescription today without complaint.  Will continue to monitor for progression. Check out: 1200   Dr. Kate Sable is Medical Director for [place] and Pulmonary Rehab.

## 2018-05-17 NOTE — Progress Notes (Signed)
Cardiac Individual Treatment Plan  Patient Details  Name: Michael Vaughn MRN: [id number] Date of Birth: 01/29/1961 Referring Provider:     CARDIAC REHAB PHASE II ORIENTATION from 04/29/2018 in [facility]  Referring Provider  Dr. Beaty      Initial Encounter Date:    CARDIAC REHAB PHASE II ORIENTATION from 04/29/2018 in [facility]  Date  04/29/18      Visit Diagnosis: Cardiomyopathy, nonischemic (HCC)  Patient's Home Medications on Admission:  Current Outpatient Medications:  .  allopurinol (ZYLOPRIM) 300 MG tablet, Take 300 mg by mouth daily., Disp: , Rfl:  .  amiodarone (PACERONE) 200 MG tablet, Take 200 mg daily by mouth., Disp: , Rfl:  .  aspirin 81 MG tablet, Take 81 mg by mouth daily. , Disp: , Rfl:  .  furosemide (LASIX) 80 MG tablet, Take 60 mg by mouth daily. , Disp: , Rfl:  .  gabapentin (NEURONTIN) 600 MG tablet, Take 600 mg by mouth 2 (two) times daily. , Disp: , Rfl:  .  glipiZIDE (GLUCOTROL XL) 10 MG 24 hr tablet, Take 10 mg by mouth daily.  , Disp: , Rfl:  .  HYDROcodone-acetaminophen (NORCO/VICODIN) 5-325 MG tablet, Take 1 tablet every 6 (six) hours as needed by mouth for moderate pain., Disp: , Rfl:  .  ibuprofen (ADVIL,MOTRIN) 400 MG tablet, Take 400 mg every 6 (six) hours as needed by mouth., Disp: , Rfl:  .  insulin glargine (LANTUS) 100 UNIT/ML injection, Inject 14 Units into the skin daily., Disp: , Rfl:  .  insulin glargine (LANTUS) 100 UNIT/ML injection, Inject 10 Units into the skin at bedtime., Disp: , Rfl:  .  lisinopril (PRINIVIL,ZESTRIL) 40 MG tablet, Take 40 mg daily by mouth., Disp: , Rfl:  .  metFORMIN (GLUCOPHAGE) 1000 MG tablet, Take 1,000 mg by mouth 2 (two) times daily with a meal., Disp: , Rfl:  .  metoprolol succinate (TOPROL-XL) 25 MG 24 hr tablet, Take 37.5 mg daily by mouth., Disp: , Rfl:  .  Multiple Vitamin (MULTI-VITAMINS) TABS, Take 1 tablet daily by mouth., Disp: , Rfl:  .  omega-3 acid ethyl  esters (LOVAZA) 1 g capsule, Take 1 g daily by mouth., Disp: , Rfl:  .  omeprazole (PRILOSEC) 20 MG capsule, Take 20 mg by mouth daily., Disp: , Rfl:  .  pravastatin (PRAVACHOL) 40 MG tablet, Take 40 mg by mouth daily.  , Disp: , Rfl:  .  ranitidine (ZANTAC) 300 MG tablet, Take 300 mg by mouth 2 (two) times daily., Disp: , Rfl:  .  senna-docusate (SENOKOT-S) 8.6-50 MG tablet, Take 2 tablets at bedtime by mouth., Disp: , Rfl:  .  spironolactone (ALDACTONE) 25 MG tablet, Take 12.5 mg daily by mouth., Disp: , Rfl:  .  tadalafil (CIALIS) 20 MG tablet, Take 1 tablet daily as needed by mouth., Disp: , Rfl: 11 .  tamsulosin (FLOMAX) 0.4 MG CAPS capsule, Take 0.4 mg by mouth daily., Disp: , Rfl:   Past Medical History: Past Medical History:  Diagnosis Date  . Anginal pain (HCC)   . 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 BPH (benign prostatic hyperplasia)   . Bradycardia    July, 2012, related to medication  . CAD (coronary artery disease)    Some coronary irregularities by catheterization 2006 /  nuclear, July, 20 12  ,  question of some ischemia in the lateral wall although technically quite difficult.  . Diabetes mellitus   .   Dyslipidemia   . Dysrhythmia   . Ejection fraction    Improved from the past  /  ejection fraction 50%, echo, July, 2012, hypokinesis at the base of the inferior wall.  Marland Kitchen GERD (gastroesophageal reflux disease)   . Headache   . History of hiatal hernia   . Hypertension   . Hypertension   . Lumbar disc disease   . Morbid obesity (HCC)   . Myocardial infarction (HCC)   . 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 OSA (obstructive sleep apnea)   . Shortness of breath    September, 2012    Tobacco Use: Social History   Tobacco Use  Smoking Status Former Smoker  . Packs/day: 1.00   . Years: 5.00  . Pack years: 5.00  . Types: Cigarettes  . Last attempt to quit: 09/22/1989  . Years since quitting: 28.6  Smokeless Tobacco Former Neurosurgeon  . Types: Snuff  . Quit date: 09/22/1989  Tobacco Comment   dipped snuff for one year    Labs: Recent Review Flowsheet Data    Labs for ITP Cardiac and Pulmonary Rehab 01/14/2016 01/22/2016 08/04/2017 12/19/2017   Hemoglobin A1c 8.2(H) 7.6(H) 8.6(H) 9.0%      Capillary Blood Glucose: Lab Results  Component Value Date   GLUCAP 194 (H) 08/13/2017   GLUCAP 203 (H) 08/13/2017   GLUCAP 184 (H) 08/05/2017   GLUCAP 363 (H) 08/05/2017   GLUCAP 205 (H) 08/05/2017     Exercise Target Goals: Exercise Program Goal: Individual exercise prescription set using results from initial 6 min walk test and THRR while considering  patient's activity barriers and safety.   Exercise Prescription Goal: Starting with aerobic activity 30 plus minutes a day, 3 days per week for initial exercise prescription. Provide home exercise prescription and guidelines that participant acknowledges understanding prior to discharge.  Activity Barriers & Risk Stratification: Activity Barriers & Cardiac Risk Stratification - 04/30/18 1706      Activity Barriers & Cardiac Risk Stratification   Activity Barriers  Back Problems;Assistive Device;Balance Concerns;Deconditioning   (R) leg weakness   Cardiac Risk Stratification  High       6 Minute Walk: 6 Minute Walk    Row Name 04/29/18 1702         6 Minute Walk   Phase  Initial     Distance  1150 feet     Walk Time  6 minutes     # of Rest Breaks  0     MPH  2.17     METS  2.66     RPE  11     Perceived Dyspnea   9     VO2 Peak  8.53     Symptoms  No     Resting HR  64 bpm     Resting BP  150/80     Resting Oxygen Saturation   98 %     Exercise Oxygen Saturation  during 6 min walk  98 %     Max Ex. HR  78 bpm     Max Ex. BP  174/92     2 Minute Post BP  168/88        Oxygen Initial  Assessment:   Oxygen Re-Evaluation:   Oxygen Discharge (Final Oxygen Re-Evaluation):   Initial Exercise Prescription: Initial Exercise Prescription - 04/30/18 1700  Date of Initial Exercise RX and Referring Provider   Date  04/29/18    Referring Provider  Dr. Alfonso Ellis    Expected Discharge Date  08/09/18      Treadmill   MPH  1    Grade  0    Minutes  17    METs  1.7      Arm Ergometer   Level  1    Watts  11    RPM  52    Minutes  17    METs  1.8      T5 Nustep   Level  1    SPM  54    Minutes  17    METs  1.7      Prescription Details   Frequency (times per week)  3    Duration  Progress to 30 minutes of continuous aerobic without signs/symptoms of physical distress      Intensity   THRR 40-80% of Max Heartrate  [phone]    Ratings of Perceived Exertion  11-13    Perceived Dyspnea  0-4      Progression   Progression  Continue progressive overload as per policy without signs/symptoms or physical distress.      Resistance Training   Training Prescription  Yes    Weight  1    Reps  10-15       Perform Capillary Blood Glucose checks as needed.  Exercise Prescription Changes:  Exercise Prescription Changes    Row Name 05/13/18 0700             Response to Exercise   Blood Pressure (Admit)  126/60       Blood Pressure (Exercise)  130/70       Blood Pressure (Exit)  144/70       Heart Rate (Admit)  52 bpm       Heart Rate (Exercise)  63 bpm       Heart Rate (Exit)  61 bpm       Rating of Perceived Exertion (Exercise)  11       Duration  Progress to 30 minutes of  aerobic without signs/symptoms of physical distress       Intensity  THRR New [phone]         Progression   Progression  Continue to progress workloads to maintain intensity without signs/symptoms of physical distress.       Average METs  1.95         Resistance Training   Training Prescription  Yes       Weight  1       Reps  10-15         Treadmill   MPH  1.3        Grade  0       Minutes  17       METs  1.9         T5 Nustep   Level  1       SPM  93       Minutes  22       METs  2         Home Exercise Plan   Plans to continue exercise at  Home (comment)       Frequency  Add 2 additional days to program exercise sessions.       Initial Home Exercises Provided  05/05/18          Exercise Comments:  Exercise  Comments    Row Name 05/17/18 1507           Exercise Comments  Patient is progressing nicely. He is only had 6 visits. His has gained 7.6lbs since his last visit on 05/14/18. Instructed patient to get a scale and weight and keep a record. He has been able to handle the increases in hand held weights and equipment w/o any abnormal S/S.           Exercise Goals and Review:  Exercise Goals    Row Name 04/30/18 1715             Exercise Goals   Increase Physical Activity  Yes       Intervention  Develop an individualized exercise prescription for aerobic and resistive training based on initial evaluation findings, risk stratification, comorbidities and participant's personal goals.;Provide advice, education, support and counseling about physical activity/exercise needs.       Expected Outcomes  Short Term: Attend rehab on a regular basis to increase amount of physical activity.;Long Term: Exercising regularly at least 3-5 days a week.       Increase Strength and Stamina  Yes       Intervention  Provide advice, education, support and counseling about physical activity/exercise needs.;Develop an individualized exercise prescription for aerobic and resistive training based on initial evaluation findings, risk stratification, comorbidities and participant's personal goals.       Expected Outcomes  Short Term: Perform resistance training exercises routinely during rehab and add in resistance training at home;Short Term: Increase workloads from initial exercise prescription for resistance, speed, and METs.;Long Term: Improve  cardiorespiratory fitness, muscular endurance and strength as measured by increased METs and functional capacity ( )       Able to understand and use rate of perceived exertion (RPE) scale  Yes       Intervention  Provide education and explanation on how to use RPE scale       Expected Outcomes  Short Term: Able to use RPE daily in rehab to express subjective intensity level;Long Term:  Able to use RPE to guide intensity level when exercising independently       Knowledge and understanding of Target Heart Rate Range (THRR)  Yes       Intervention  Provide education and explanation of THRR including how the numbers were predicted and where they are located for reference       Expected Outcomes  Short Term: Able to state/look up THRR;Long Term: Able to use THRR to govern intensity when exercising independently       Able to check pulse independently  Yes       Intervention  Provide education and demonstration on how to check pulse in carotid and radial arteries.;Review the importance of being able to check your own pulse for safety during independent exercise       Expected Outcomes  Short Term: Able to explain why pulse checking is important during independent exercise;Long Term: Able to check pulse independently and accurately       Understanding of Exercise Prescription  Yes       Intervention  Provide education, explanation, and written materials on patient's individual exercise prescription       Expected Outcomes  Short Term: Able to explain program exercise prescription;Long Term: Able to explain home exercise prescription to exercise independently          Exercise Goals Re-Evaluation : Exercise Goals Re-Evaluation    Row Name 05/17/18 [phone]  Exercise Goal Re-Evaluation   Exercise Goals Review  Increase Physical Activity;Increase Strength and Stamina;Able to check pulse independently;Knowledge and understanding of Target Heart Rate Range (THRR)       Expected Outcomes   Improve activity and lose 40lbs           Discharge Exercise Prescription (Final Exercise Prescription Changes): Exercise Prescription Changes - 05/13/18 0700      Response to Exercise   Blood Pressure (Admit)  126/60    Blood Pressure (Exercise)  130/70    Blood Pressure (Exit)  144/70    Heart Rate (Admit)  52 bpm    Heart Rate (Exercise)  63 bpm    Heart Rate (Exit)  61 bpm    Rating of Perceived Exertion (Exercise)  11    Duration  Progress to 30 minutes of  aerobic without signs/symptoms of physical distress    Intensity  THRR New   [phone]     Progression   Progression  Continue to progress workloads to maintain intensity without signs/symptoms of physical distress.    Average METs  1.95      Resistance Training   Training Prescription  Yes    Weight  1    Reps  10-15      Treadmill   MPH  1.3    Grade  0    Minutes  17    METs  1.9      T5 Nustep   Level  1    SPM  93    Minutes  22    METs  2      Home Exercise Plan   Plans to continue exercise at  Home (comment)    Frequency  Add 2 additional days to program exercise sessions.    Initial Home Exercises Provided  05/05/18       Nutrition:  Target Goals: Understanding of nutrition guidelines, daily intake of sodium 1500mg , cholesterol 200mg , calories 30% from fat and 7% or less from saturated fats, daily to have 5 or more servings of fruits and vegetables.  Biometrics: Pre Biometrics - 04/30/18 1718      Pre Biometrics   Height  5\' 11"  (1.803 m)    Weight  283 lb (128.4 kg)    Waist Circumference  53 inches    Hip Circumference  43 inches    Waist to Hip Ratio  1.23 %    BMI (Calculated)  39.49    Triceps Skinfold  20 mm    % Body Fat  38.4 %    Grip Strength  29.8 kg   kilograms   Flexibility  10.2 in    Single Leg Stand  38 seconds        Nutrition Therapy Plan and Nutrition Goals: Nutrition Therapy & Goals - 05/17/18 1420      Nutrition Therapy   RD appointment deferred  Yes       Personal Nutrition Goals   Personal Goal #2  Patient says he continues to eat a low fat, low sodium diabetic diet.     Additional Goals?  No       Nutrition Assessments: Nutrition Assessments - 04/30/18 1724      MEDFICTS Scores   Pre Score  24       Nutrition Goals Re-Evaluation:   Nutrition Goals Discharge (Final Nutrition Goals Re-Evaluation):   Psychosocial: Target Goals: Acknowledge presence or absence of significant depression and/or stress, maximize coping skills, provide positive support system. Participant is able  to verbalize types and ability to use techniques and skills needed for reducing stress and depression.  Initial Review & Psychosocial Screening: Initial Psych Review & Screening - 04/30/18 1721      Initial Review   Current issues with  None Identified      Family Dynamics   Good Support System?  Yes      Barriers   Psychosocial barriers to participate in program  There are no identifiable barriers or psychosocial needs.      Screening Interventions   Interventions  Encouraged to exercise    Expected Outcomes  Short Term goal: Identification and review with participant of any Quality of Life or Depression concerns found by scoring the questionnaire.;Long Term goal: The participant improves quality of Life and PHQ9 Scores as seen by post scores and/or verbalization of changes       Quality of Life Scores: Quality of Life - 04/30/18 1722      Quality of Life   Select  Quality of Life      Quality of Life Scores   Health/Function Pre  19.29 %    Socioeconomic Pre  18 %    Psych/Spiritual Pre  24.86 %    Family Pre  24 %    GLOBAL Pre  20.91 %      Scores of 19 and below usually indicate a poorer quality of life in these areas.  A difference of  2-3 points is a clinically meaningful difference.  A difference of 2-3 points in the total score of the Quality of Life Index has been associated with significant improvement in overall quality of  life, self-image, physical symptoms, and general health in studies assessing change in quality of life.  PHQ-9: Recent Review Flowsheet Data    There is no flowsheet data to display.     Interpretation of Total Score  Total Score Depression Severity:  1-4 = Minimal depression, 5-9 = Mild depression, 10-14 = Moderate depression, 15-19 = Moderately severe depression, 20-27 = Severe depression   Psychosocial Evaluation and Intervention: Psychosocial Evaluation - 04/30/18 1722      Psychosocial Evaluation & Interventions   Interventions  Encouraged to exercise with the program and follow exercise prescription    Continue Psychosocial Services   No Follow up required       Psychosocial Re-Evaluation: Psychosocial Re-Evaluation    Row Name 05/17/18 1414             Psychosocial Re-Evaluation   Current issues with  None Identified       Comments  Patient's initial QOL score was 20.91 and his PHQ-9 score was 3 with no psychosocial issues identified at orientation.       Expected Outcomes  Patient will not have any psychosocial issues identified at dishcharge.        Interventions  Stress management education;Relaxation education;Encouraged to attend Cardiac Rehabilitation for the exercise       Continue Psychosocial Services   No Follow up required          Psychosocial Discharge (Final Psychosocial Re-Evaluation): Psychosocial Re-Evaluation - 05/17/18 1414      Psychosocial Re-Evaluation   Current issues with  None Identified    Comments  Patient's initial QOL score was 20.91 and his PHQ-9 score was 3 with no psychosocial issues identified at orientation.    Expected Outcomes  Patient will not have any psychosocial issues identified at dishcharge.     Interventions  Stress management education;Relaxation education;Encouraged to attend Cardiac  Rehabilitation for the exercise    Continue Psychosocial Services   No Follow up required       Vocational Rehabilitation: Provide  vocational rehab assistance to qualifying candidates.   Vocational Rehab Evaluation & Intervention: Vocational Rehab - 04/30/18 1725      Initial Vocational Rehab Evaluation & Intervention   Assessment shows need for Vocational Rehabilitation  No   Disabled      Education: Education Goals: Education classes will be provided on a weekly basis, covering required topics. Participant will state understanding/return demonstration of topics presented.  Learning Barriers/Preferences: Learning Barriers/Preferences - 04/30/18 1724      Learning Barriers/Preferences   Learning Barriers  None    Learning Preferences  Individual Instruction;Group Instruction       Education Topics: Hypertension, Hypertension Reduction -Define heart disease and high blood pressure. Discus how high blood pressure affects the body and ways to reduce high blood pressure.   Exercise and Your Heart -Discuss why it is important to exercise, the FITT principles of exercise, normal and abnormal responses to exercise, and how to exercise safely.   CARDIAC REHAB PHASE II EXERCISE from 05/05/2018 in [facility]  Date  05/05/18  Educator  D. Chantella Creech  Instruction Review Code  2- Demonstrated Understanding      Angina -Discuss definition of angina, causes of angina, treatment of angina, and how to decrease risk of having angina.   Cardiac Medications -Review what the following cardiac medications are used for, how they affect the body, and side effects that may occur when taking the medications.  Medications include Aspirin, Beta blockers, calcium channel blockers, ACE Inhibitors, angiotensin receptor blockers, diuretics, digoxin, and antihyperlipidemics.   Congestive Heart Failure -Discuss the definition of CHF, how to live with CHF, the signs and symptoms of CHF, and how keep track of weight and sodium intake.   Heart Disease and Intimacy -Discus the effect sexual activity has on the heart,  how changes occur during intimacy as we age, and safety during sexual activity.   Smoking Cessation / COPD -Discuss different methods to quit smoking, the health benefits of quitting smoking, and the definition of COPD.   Nutrition I: Fats -Discuss the types of cholesterol, what cholesterol does to the heart, and how cholesterol levels can be controlled.   Nutrition II: Labels -Discuss the different components of food labels and how to read food label   Heart Parts/Heart Disease and PAD -Discuss the anatomy of the heart, the pathway of blood circulation through the heart, and these are affected by heart disease.   Stress I: Signs and Symptoms -Discuss the causes of stress, how stress may lead to anxiety and depression, and ways to limit stress.   Stress II: Relaxation -Discuss different types of relaxation techniques to limit stress.   Warning Signs of Stroke / TIA -Discuss definition of a stroke, what the signs and symptoms are of a stroke, and how to identify when someone is having stroke.   Knowledge Questionnaire Score: Knowledge Questionnaire Score - 04/30/18 1725      Knowledge Questionnaire Score   Pre Score  21/24       Core Components/Risk Factors/Patient Goals at Admission: Personal Goals and Risk Factors at Admission - 04/30/18 1725      Core Components/Risk Factors/Patient Goals on Admission    Weight Management  Yes    Intervention  Weight Management/Obesity: Establish reasonable short term and long term weight goals.    Admit Weight  283 lb (128.4  kg)    Goal Weight: Short Term  273 lb (123.8 kg)    Goal Weight: Long Term  263 lb (119.3 kg)    Expected Outcomes  Short Term: Continue to assess and modify interventions until short term weight is achieved;Long Term: Adherence to nutrition and physical activity/exercise program aimed toward attainment of established weight goal    Personal Goal Other  Yes    Personal Goal  Improve activity, Lose 40 lbs  overall. Will work at losing 15-20lbs while in program.     Intervention  Attend program 3 x week and supplement exercise at home 2 x week.     Expected Outcomes  Reach personal goals.        Core Components/Risk Factors/Patient Goals Review:  Goals and Risk Factor Review    Row Name 05/17/18 1408             Core Components/Risk Factors/Patient Goals Review   Personal Goals Review  Weight Management/Obesity;Diabetes;Hypertension Improve activity; lose weigth 40 lbs long term.       Review  Patient has completed 6 sessions gaining 7 lbs since his orientation visit. He says he feels better after only 6 sessions with increased strength. He says he recently had an A1C which was 6.7 down from 7.5 on his previous one. His blood pressure remains hypertensive. Will continue to monitor for progress.        Expected Outcomes  Patient will continue to attend sessions and complete the program meeting his personal goals.           Core Components/Risk Factors/Patient Goals at Discharge (Final Review):  Goals and Risk Factor Review - 05/17/18 1408      Core Components/Risk Factors/Patient Goals Review   Personal Goals Review  Weight Management/Obesity;Diabetes;Hypertension   Improve activity; lose weigth 40 lbs long term.   Review  Patient has completed 6 sessions gaining 7 lbs since his orientation visit. He says he feels better after only 6 sessions with increased strength. He says he recently had an A1C which was 6.7 down from 7.5 on his previous one. His blood pressure remains hypertensive. Will continue to monitor for progress.     Expected Outcomes  Patient will continue to attend sessions and complete the program meeting his personal goals.        ITP Comments: ITP Comments    Row Name 04/30/18 1519 05/03/18 0734         ITP Comments  Patient was sent from [facility]. 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 He is eager to get started.   Patient new to  program. Plans to start Wednesday 05/05/18.         Comments: ITP REVIEW Pt is doing well in program. Will continue to monitor for progress.

## 2018-05-19 ENCOUNTER — Encounter (HOSPITAL_COMMUNITY)
Admission: RE | Admit: 2018-05-19 | Discharge: 2018-05-19 | Disposition: A | Discharge status: Home / Self Care | Payer: Medicaid Other | Source: Ambulatory Visit | Attending: Internal Medicine | Admitting: Internal Medicine

## 2018-05-19 DIAGNOSIS — I428 Other cardiomyopathies: Secondary | ICD-10-CM

## 2018-05-19 NOTE — Progress Notes (Signed)
Daily Session Note  Patient Details  Name: Michael Vaughn MRN: [id number] Date of Birth: 1960-12-27 Referring Provider:     CARDIAC REHAB PHASE II ORIENTATION from 04/29/2018 in [place]  Referring Provider  [name]      Encounter Date: 05/19/2018  Check In: Session Check In - 05/19/18 1100      Check-In   Supervising physician immediately available to respond to emergencies  See telemetry face sheet for immediately available MD    Location  [facility]    Staff Present  Russella Dar, MS, EP, [facility], Exercise Physiologist;Debra Wynetta Emery, RN, BSN    Medication changes reported      Yes    Comments  Increased Lasix 80m from 654mfor 3 days    Fall or balance concerns reported     Yes    Comments  Due to his back surgery his (R) leg gets weak and numb.     Tobacco Cessation  No Change    Warm-up and Cool-down  Performed as group-led instruction    Resistance Training Performed  Yes    VAD Patient?  No    PAD/SET Patient?  No      Pain Assessment   Currently in Pain?  No/denies    Pain Score  0-No pain    Multiple Pain Sites  No       Capillary Blood Glucose: No results found for this or any previous visit (from the past 24 hour(s)).    Social History   Tobacco Use  Smoking Status Former Smoker  . Packs/day: 1.00  . Years: 5.00  . Pack years: 5.00  . Types: Cigarettes  . Last attempt to quit: 09/22/1989  . Years since quitting: 28.6  Smokeless Tobacco Former [employer] developer. Types: Snuff  . Quit date: 09/22/1989  Tobacco Comment   dipped snuff for one year    Goals Met:  Independence with exercise equipment Exercise tolerated well Personal goals reviewed No report of cardiac concerns or symptoms Strength training completed today  Goals Unmet:  Not Applicable  Comments: Pt able to follow exercise prescription today without complaint.  Will continue to monitor for progression. Check out: 12:00   Dr. SuKate Sables Medical  Director for [facility].

## 2018-05-21 ENCOUNTER — Encounter (HOSPITAL_COMMUNITY)
Admission: RE | Admit: 2018-05-21 | Discharge: 2018-05-21 | Disposition: A | Discharge status: Home / Self Care | Payer: Medicaid Other | Source: Ambulatory Visit | Attending: Internal Medicine | Admitting: Internal Medicine

## 2018-05-21 DIAGNOSIS — I428 Other cardiomyopathies: Secondary | ICD-10-CM | POA: Diagnosis not present

## 2018-05-21 NOTE — Progress Notes (Signed)
Daily Session Note  Patient Details  Name: Michael Vaughn MRN: [id number] Date of Birth: Mar 21, 1961 Referring Provider:     CARDIAC REHAB PHASE II ORIENTATION from 04/29/2018 in [place]  Referring Provider  [name]      Encounter Date: 05/21/2018  Check In: Session Check In - 05/21/18 1134      Check-In   Supervising physician immediately available to respond to emergencies  See telemetry face sheet for immediately available ER MD    Location  [facility]    Staff Present  Aundra Dubin, RN, BSN;Other    Medication changes reported      No    Fall or balance concerns reported     No    Tobacco Cessation  No Change    Warm-up and Cool-down  Performed as group-led instruction    Resistance Training Performed  Yes    VAD Patient?  No    PAD/SET Patient?  No      Pain Assessment   Currently in Pain?  No/denies    Pain Score  0-No pain    Multiple Pain Sites  No       Capillary Blood Glucose: No results found for this or any previous visit (from the past 24 hour(s)).    Social History   Tobacco Use  Smoking Status Former Smoker  . Packs/day: 1.00  . Years: 5.00  . Pack years: 5.00  . Types: Cigarettes  . Last attempt to quit: 09/22/1989  . Years since quitting: 28.6  Smokeless Tobacco Former Systems developer  . Types: Snuff  . Quit date: 09/22/1989  Tobacco Comment   dipped snuff for one year    Goals Met:  Independence with exercise equipment Exercise tolerated well No report of cardiac concerns or symptoms Strength training completed today  Goals Unmet:  Not Applicable  Comments: Pt able to follow exercise prescription today without complaint.  Will continue to monitor for progression. Checked out at 12:00pm.    Dr. Kate Sable is Medical Director for [facility] and Pulmonary Rehab.

## 2018-05-24 ENCOUNTER — Encounter (HOSPITAL_COMMUNITY): Payer: Medicaid Other

## 2018-05-26 ENCOUNTER — Encounter (HOSPITAL_COMMUNITY)
Admission: RE | Admit: 2018-05-26 | Discharge: 2018-05-26 | Disposition: A | Discharge status: Home / Self Care | Payer: Medicaid Other | Source: Ambulatory Visit | Attending: Internal Medicine | Admitting: Internal Medicine

## 2018-05-26 DIAGNOSIS — E119 Type 2 diabetes mellitus without complications: Secondary | ICD-10-CM | POA: Diagnosis not present

## 2018-05-26 DIAGNOSIS — Z87891 Personal history of nicotine dependence: Secondary | ICD-10-CM | POA: Insufficient documentation

## 2018-05-26 DIAGNOSIS — M519 Unspecified thoracic, thoracolumbar and lumbosacral intervertebral disc disorder: Secondary | ICD-10-CM | POA: Diagnosis not present

## 2018-05-26 DIAGNOSIS — K219 Gastro-esophageal reflux disease without esophagitis: Secondary | ICD-10-CM | POA: Diagnosis not present

## 2018-05-26 DIAGNOSIS — I428 Other cardiomyopathies: Secondary | ICD-10-CM | POA: Diagnosis not present

## 2018-05-26 DIAGNOSIS — Z7982 Long term (current) use of aspirin: Secondary | ICD-10-CM | POA: Insufficient documentation

## 2018-05-26 DIAGNOSIS — Z794 Long term (current) use of insulin: Secondary | ICD-10-CM | POA: Diagnosis not present

## 2018-05-26 DIAGNOSIS — Z79899 Other long term (current) drug therapy: Secondary | ICD-10-CM | POA: Insufficient documentation

## 2018-05-26 DIAGNOSIS — G4733 Obstructive sleep apnea (adult) (pediatric): Secondary | ICD-10-CM | POA: Diagnosis not present

## 2018-05-26 DIAGNOSIS — I252 Old myocardial infarction: Secondary | ICD-10-CM | POA: Diagnosis not present

## 2018-05-26 DIAGNOSIS — N400 Enlarged prostate without lower urinary tract symptoms: Secondary | ICD-10-CM | POA: Diagnosis not present

## 2018-05-26 DIAGNOSIS — Z6839 Body mass index (BMI) 39.0-39.9, adult: Secondary | ICD-10-CM | POA: Insufficient documentation

## 2018-05-26 DIAGNOSIS — E785 Hyperlipidemia, unspecified: Secondary | ICD-10-CM | POA: Insufficient documentation

## 2018-05-26 DIAGNOSIS — I10 Essential (primary) hypertension: Secondary | ICD-10-CM | POA: Diagnosis not present

## 2018-05-26 DIAGNOSIS — I2510 Atherosclerotic heart disease of native coronary artery without angina pectoris: Secondary | ICD-10-CM | POA: Diagnosis not present

## 2018-05-26 NOTE — Progress Notes (Signed)
Daily Session Note  Patient Details  Name: Michael Vaughn MRN: [id number] Date of Birth: 09/29/1960 Referring Provider:     CARDIAC REHAB PHASE II ORIENTATION from 04/29/2018 in [place] [facility]  Referring Provider  Dr. Beaty      Encounter Date: 05/26/2018  Check In: Session Check In - 05/26/18 1100      Check-In   Supervising physician immediately available to respond to emergencies  See telemetry face sheet for immediately available MD    Location  [facility]    Staff Present  Debra Johnson, RN, BSN;Other    Medication changes reported      No    Fall or balance concerns reported     Yes    Comments  Due to his back surgery his (R) leg gets weak and numb.     Tobacco Cessation  No Change    Warm-up and Cool-down  Performed as group-led instruction    Resistance Training Performed  Yes    VAD Patient?  No    PAD/SET Patient?  No      Pain Assessment   Currently in Pain?  No/denies    Pain Score  0-No pain    Multiple Pain Sites  No       Capillary Blood Glucose: No results found for this or any previous visit (from the past 24 hour(s)).    Social History   Tobacco Use  Smoking Status Former Smoker  . Packs/day: 1.00  . Years: 5.00  . Pack years: 5.00  . Types: Cigarettes  . Last attempt to quit: 09/22/1989  . Years since quitting: 28.6  Smokeless Tobacco Former User  . Types: Snuff  . Quit date: 09/22/1989  Tobacco Comment   dipped snuff for one year    Goals Met:  Independence with exercise equipment Exercise tolerated well No report of cardiac concerns or symptoms Strength training completed today  Goals Unmet:  Not Applicable  Comments: Pt able to follow exercise prescription today without complaint.  Will continue to monitor for progression. Checked out at 12:00pm   Dr. Suresh Koneswaran is Medical Director for  Cardiac and Pulmonary Rehab. 

## 2018-05-28 ENCOUNTER — Encounter (HOSPITAL_COMMUNITY)
Admission: RE | Admit: 2018-05-28 | Discharge: 2018-05-28 | Disposition: A | Discharge status: Home / Self Care | Payer: Medicaid Other | Source: Ambulatory Visit | Attending: Internal Medicine | Admitting: Internal Medicine

## 2018-05-28 DIAGNOSIS — I428 Other cardiomyopathies: Secondary | ICD-10-CM

## 2018-05-28 NOTE — Progress Notes (Signed)
Daily Session Note  Patient Details  Name: Michael Vaughn MRN: [id number] Date of Birth: Oct 26, 1960 Referring Provider:     CARDIAC REHAB PHASE II ORIENTATION from 04/29/2018 in [place]  Referring Provider  [name]      Encounter Date: 05/28/2018  Check In: Session Check In - 05/28/18 1100      Check-In   Supervising physician immediately available to respond to emergencies  See telemetry face sheet for immediately available MD    Location  [facility]    Staff Present  Aundra Dubin, RN, BSN;Other    Medication changes reported      No    Fall or balance concerns reported     Yes    Comments  Due to his back surgery his (R) leg gets weak and numb.     Tobacco Cessation  No Change    Warm-up and Cool-down  Performed as group-led instruction    Resistance Training Performed  Yes    VAD Patient?  No    PAD/SET Patient?  No      Pain Assessment   Currently in Pain?  No/denies    Pain Score  0-No pain    Multiple Pain Sites  No       Capillary Blood Glucose: No results found for this or any previous visit (from the past 24 hour(s)).    Social History   Tobacco Use  Smoking Status Former Smoker  . Packs/day: 1.00  . Years: 5.00  . Pack years: 5.00  . Types: Cigarettes  . Last attempt to quit: 09/22/1989  . Years since quitting: 28.6  Smokeless Tobacco Former Systems developer  . Types: Snuff  . Quit date: 09/22/1989  Tobacco Comment   dipped snuff for one year    Goals Met:  Independence with exercise equipment Exercise tolerated well No report of cardiac concerns or symptoms Strength training completed today  Goals Unmet:  Not Applicable  Comments: Pt able to follow exercise prescription today without complaint.  Will continue to monitor for progression. Checked out at 12:00pm.   Dr. Kate Sable is Medical Director for [facility] and [facility]monary Re[facility].

## 2018-05-31 ENCOUNTER — Encounter (HOSPITAL_COMMUNITY)
Admission: RE | Admit: 2018-05-31 | Discharge: 2018-05-31 | Disposition: A | Discharge status: Home / Self Care | Payer: Medicaid Other | Source: Ambulatory Visit | Attending: Internal Medicine | Admitting: Internal Medicine

## 2018-05-31 DIAGNOSIS — I428 Other cardiomyopathies: Secondary | ICD-10-CM | POA: Diagnosis not present

## 2018-05-31 NOTE — Progress Notes (Signed)
Daily Session Note  Patient Details  Name: Michael Vaughn MRN: [id number] Date of Birth: Jul 13, 1961 Referring Provider:     CARDIAC REHAB PHASE II ORIENTATION from 04/29/2018 in [place]  Referring Provider  [name]      Encounter Date: 05/31/2018  Check In: Session Check In - 05/31/18 1100      Check-In   Supervising physician immediately available to respond to emergencies  See telemetry face sheet for immediately available MD    Location  [facility]    Staff Present  Russella Dar, MS, EP, [facility], Exercise Physiologist;Debra Wynetta Emery, RN, BSN;Other    Medication changes reported      No    Fall or balance concerns reported     Yes    Comments  Due to his back surgery his (R) leg gets weak and numb.     Warm-up and Cool-down  Performed as group-led Higher education careers adviser Performed  Yes    VAD Patient?  No    PAD/SET Patient?  No      Pain Assessment   Currently in Pain?  No/denies    Pain Score  0-No pain    Multiple Pain Sites  No       Capillary Blood Glucose: No results found for this or any previous visit (from the past 24 hour(s)).    Social History   Tobacco Use  Smoking Status Former Smoker  . Packs/day: 1.00  . Years: 5.00  . Pack years: 5.00  . Types: Cigarettes  . Last attempt to quit: 09/22/1989  . Years since quitting: 28.7  Smokeless Tobacco Former Systems developer  . Types: Snuff  . Quit date: 09/22/1989  Tobacco Comment   dipped snuff for one year    Goals Met:  Independence with exercise equipment Exercise tolerated well Personal goals reviewed No report of cardiac concerns or symptoms Strength training completed today  Goals Unmet:  Not Applicable  Comments: Pt able to follow exercise prescription today without complaint.  Will continue to monitor for progression. Check out: 12:00   Dr. Kate Sable is Medical Director for [facility] and Pulmonary Rehab.

## 2018-06-02 ENCOUNTER — Encounter (HOSPITAL_COMMUNITY)
Admission: RE | Admit: 2018-06-02 | Discharge: 2018-06-02 | Disposition: A | Discharge status: Home / Self Care | Payer: Medicaid Other | Source: Ambulatory Visit | Attending: Internal Medicine | Admitting: Internal Medicine

## 2018-06-02 DIAGNOSIS — I428 Other cardiomyopathies: Secondary | ICD-10-CM | POA: Diagnosis not present

## 2018-06-02 NOTE — Progress Notes (Signed)
Daily Session Note  Patient Details  Name: Michael Vaughn MRN: [id number] Date of Birth: 12-04-1960 Referring Provider:     CARDIAC REHAB PHASE II ORIENTATION from 04/29/2018 in [place]  Referring Provider  [name]      Encounter Date: 06/02/2018  Check In: Session Check In - 06/02/18 0815      Check-In   Supervising physician immediately available to respond to emergencies  See telemetry face sheet for immediately available MD    Location  [facility]    Staff Present  Benay Pike, Exercise Physiologist;Debra Wynetta Emery, RN, BSN    Medication changes reported      No    Fall or balance concerns reported     Yes    Comments  Due to his back surgery his (R) leg gets weak and numb.     Tobacco Cessation  No Change    Warm-up and Cool-down  Performed as group-led instruction    Resistance Training Performed  Yes    VAD Patient?  No    PAD/SET Patient?  No      Pain Assessment   Currently in Pain?  No/denies    Pain Score  0-No pain    Multiple Pain Sites  No       Capillary Blood Glucose: No results found for this or any previous visit (from the past 24 hour(s)).    Social History   Tobacco Use  Smoking Status Former Smoker  . Packs/day: 1.00  . Years: 5.00  . Pack years: 5.00  . Types: Cigarettes  . Last attempt to quit: 09/22/1989  . Years since quitting: 28.7  Smokeless Tobacco Former Systems developer  . Types: Snuff  . Quit date: 09/22/1989  Tobacco Comment   dipped snuff for one year    Goals Met:  Independence with exercise equipment Exercise tolerated well No report of cardiac concerns or symptoms Strength training completed today  Goals Unmet:  Not Applicable  Comments: Pt able to follow exercise prescription today without complaint.  Will continue to monitor for progression. Checked out at 9:15.    Dr. Kate Sable is Medical Director for [facility] Cardiac and Pulmonary Rehab.

## 2018-06-04 ENCOUNTER — Encounter (HOSPITAL_COMMUNITY)
Admission: RE | Admit: 2018-06-04 | Discharge: 2018-06-04 | Disposition: A | Discharge status: Home / Self Care | Payer: Medicaid Other | Source: Ambulatory Visit | Attending: Internal Medicine | Admitting: Internal Medicine

## 2018-06-04 DIAGNOSIS — I428 Other cardiomyopathies: Secondary | ICD-10-CM | POA: Diagnosis not present

## 2018-06-04 NOTE — Progress Notes (Signed)
Daily Session Note  Patient Details  Name: Michael Vaughn MRN: [id number] Date of Birth: Feb 03, 1961 Referring Provider:     CARDIAC REHAB PHASE II ORIENTATION from 04/29/2018 in [place]  Referring Provider  [name]      Encounter Date: 06/04/2018  Check In: Session Check In - 06/04/18 1100      Check-In   Supervising physician immediately available to respond to emergencies  See telemetry face sheet for immediately available MD    Location  [facility]    Staff Present  Benay Pike, Exercise Physiologist;Tabor Denham Wynetta Emery, RN, BSN    Medication changes reported      No    Fall or balance concerns reported     Yes    Comments  Due to his back surgery his (R) leg gets weak and numb.     Warm-up and Cool-down  Performed as group-led Higher education careers adviser Performed  Yes    VAD Patient?  No    PAD/SET Patient?  No      Pain Assessment   Currently in Pain?  No/denies    Pain Score  0-No pain    Multiple Pain Sites  No       Capillary Blood Glucose: No results found for this or any previous visit (from the past 24 hour(s)).    Social History   Tobacco Use  Smoking Status Former Smoker  . Packs/day: 1.00  . Years: 5.00  . Pack years: 5.00  . Types: Cigarettes  . Last attempt to quit: 09/22/1989  . Years since quitting: 28.7  Smokeless Tobacco Former Systems developer  . Types: Snuff  . Quit date: 09/22/1989  Tobacco Comment   dipped snuff for one year    Goals Met:  Independence with exercise equipment Exercise tolerated well No report of cardiac concerns or symptoms Strength training completed today  Goals Unmet:  Not Applicable  Comments: Pt able to follow exercise prescription today without complaint.  Will continue to monitor for progression. Check out 1200.   Dr. Kate Sable is Medical Director for [facility] Cardiac and Pulmonary Rehab.

## 2018-06-07 ENCOUNTER — Encounter (HOSPITAL_COMMUNITY)
Admission: RE | Admit: 2018-06-07 | Discharge: 2018-06-07 | Disposition: A | Discharge status: Home / Self Care | Payer: Medicaid Other | Source: Ambulatory Visit | Attending: Internal Medicine | Admitting: Internal Medicine

## 2018-06-07 DIAGNOSIS — I428 Other cardiomyopathies: Secondary | ICD-10-CM

## 2018-06-07 NOTE — Progress Notes (Signed)
Daily Session Note  Patient Details  Name: Michael Vaughn MRN: [id number] Date of Birth: Jul 28, 1961 Referring Provider:     CARDIAC REHAB PHASE II ORIENTATION from 04/29/2018 in [place]  Referring Provider  [name]      Encounter Date: 06/07/2018  Check In: Session Check In - 06/07/18 1100      Check-In   Supervising physician immediately available to respond to emergencies  See telemetry face sheet for immediately available MD    Location  [facility]    Staff Present  Benay Pike, Exercise Physiologist;Calib Wadhwa Wynetta Emery, RN, BSN;Diane Coad, MS, EP, [facility], Exercise Physiologist    Medication changes reported      No    Fall or balance concerns reported     Yes    Comments  Due to his back surgery his (R) leg gets weak and numb.     Warm-up and Cool-down  Performed as group-led Higher education careers adviser Performed  Yes    VAD Patient?  No    PAD/SET Patient?  No      Pain Assessment   Currently in Pain?  No/denies    Pain Score  0-No pain    Multiple Pain Sites  No       Capillary Blood Glucose: No results found for this or any previous visit (from the past 24 hour(s)).    Social History   Tobacco Use  Smoking Status Former Smoker  . Packs/day: 1.00  . Years: 5.00  . Pack years: 5.00  . Types: Cigarettes  . Last attempt to quit: 09/22/1989  . Years since quitting: 28.7  Smokeless Tobacco Former Systems developer  . Types: Snuff  . Quit date: 09/22/1989  Tobacco Comment   dipped snuff for one year    Goals Met:  Independence with exercise equipment Exercise tolerated well No report of cardiac concerns or symptoms Strength training completed today  Goals Unmet:  Not Applicable  Comments: Pt able to follow exercise prescription today without complaint.  Will continue to monitor for progression. Check out 1200.   Dr. Kate Sable is Medical Director for [facility] Cardiac and Pulmonary Rehab.

## 2018-06-07 NOTE — Progress Notes (Signed)
Cardiac Individual Treatment Plan  Patient Details  Name: Michael Vaughn MRN: [id number] Date of Birth: 10/01/1960 Referring Provider:     CARDIAC REHAB PHASE II ORIENTATION from 04/29/2018 in [facility]  Referring Provider  Dr. Beaty      Initial Encounter Date:    CARDIAC REHAB PHASE II ORIENTATION from 04/29/2018 in [facility]  Date  04/29/18      Visit Diagnosis: Cardiomyopathy, nonischemic (HCC)  Patient's Home Medications on Admission:  Current Outpatient Medications:  .  allopurinol (ZYLOPRIM) 300 MG tablet, Take 300 mg by mouth daily., Disp: , Rfl:  .  amiodarone (PACERONE) 200 MG tablet, Take 200 mg daily by mouth., Disp: , Rfl:  .  aspirin 81 MG tablet, Take 81 mg by mouth daily. , Disp: , Rfl:  .  furosemide (LASIX) 80 MG tablet, Take 60 mg by mouth daily. , Disp: , Rfl:  .  gabapentin (NEURONTIN) 600 MG tablet, Take 600 mg by mouth 2 (two) times daily. , Disp: , Rfl:  .  glipiZIDE (GLUCOTROL XL) 10 MG 24 hr tablet, Take 10 mg by mouth daily.  , Disp: , Rfl:  .  HYDROcodone-acetaminophen (NORCO/VICODIN) 5-325 MG tablet, Take 1 tablet every 6 (six) hours as needed by mouth for moderate pain., Disp: , Rfl:  .  ibuprofen (ADVIL,MOTRIN) 400 MG tablet, Take 400 mg every 6 (six) hours as needed by mouth., Disp: , Rfl:  .  insulin glargine (LANTUS) 100 UNIT/ML injection, Inject 14 Units into the skin daily., Disp: , Rfl:  .  insulin glargine (LANTUS) 100 UNIT/ML injection, Inject 10 Units into the skin at bedtime., Disp: , Rfl:  .  lisinopril (PRINIVIL,ZESTRIL) 40 MG tablet, Take 40 mg daily by mouth., Disp: , Rfl:  .  metFORMIN (GLUCOPHAGE) 1000 MG tablet, Take 1,000 mg by mouth 2 (two) times daily with a meal., Disp: , Rfl:  .  metoprolol succinate (TOPROL-XL) 25 MG 24 hr tablet, Take 37.5 mg daily by mouth., Disp: , Rfl:  .  Multiple Vitamin (MULTI-VITAMINS) TABS, Take 1 tablet daily by mouth., Disp: , Rfl:  .  omega-3 acid ethyl  esters (LOVAZA) 1 g capsule, Take 1 g daily by mouth., Disp: , Rfl:  .  omeprazole (PRILOSEC) 20 MG capsule, Take 20 mg by mouth daily., Disp: , Rfl:  .  pravastatin (PRAVACHOL) 40 MG tablet, Take 40 mg by mouth daily.  , Disp: , Rfl:  .  ranitidine (ZANTAC) 300 MG tablet, Take 300 mg by mouth 2 (two) times daily., Disp: , Rfl:  .  senna-docusate (SENOKOT-S) 8.6-50 MG tablet, Take 2 tablets at bedtime by mouth., Disp: , Rfl:  .  spironolactone (ALDACTONE) 25 MG tablet, Take 12.5 mg daily by mouth., Disp: , Rfl:  .  tadalafil (CIALIS) 20 MG tablet, Take 1 tablet daily as needed by mouth., Disp: , Rfl: 11 .  tamsulosin (FLOMAX) 0.4 MG CAPS capsule, Take 0.4 mg by mouth daily., Disp: , Rfl:   Past Medical History: Past Medical History:  Diagnosis Date  . Anginal pain (HCC)   . 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 BPH (benign prostatic hyperplasia)   . Bradycardia    July, 2012, related to medication  . CAD (coronary artery disease)    Some coronary irregularities by catheterization 2006 /  nuclear, July, 20 12  ,  question of some ischemia in the lateral wall although technically quite difficult.  . Diabetes mellitus   .   Dyslipidemia   . Dysrhythmia   . Ejection fraction    Improved from the past  /  ejection fraction 50%, echo, July, 2012, hypokinesis at the base of the inferior wall.  Mar[place] Kitchen GERD (gastroesophageal reflux disease)   . Headache   . History of hiatal hernia   . Hypertension   . Hypertension   . Lumbar disc disease   . Morbid obesity ([place])   . Myocardial infarction ([place])   . 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 OSA (obstructive sleep apnea)   . Shortness of breath    September, 2012    Tobacco Use: Social History   Tobacco Use  Smoking Status Former Smoker  . Packs/day: 1.00   . Years: 5.00  . Pack years: 5.00  . Types: Cigarettes  . Last attempt to quit: 09/22/1989  . Years since quitting: 28.7  Smokeless Tobacco Former Systems developer  . Types: Snuff  . Quit date: 09/22/1989  Tobacco Comment   dipped snuff for one year    Labs: Recent Review Flowsheet Data    Labs for ITP Cardiac and Pulmonary Rehab 01/14/2016 01/22/2016 08/04/2017 12/19/2017   Hemoglobin A1c 8.2(H) 7.6(H) 8.6(H) 9.0%      Capillary Blood Glucose: Lab Results  Component Value Date   GLUCAP 194 (H) 08/13/2017   GLUCAP 203 (H) 08/13/2017   GLUCAP 184 (H) 08/05/2017   GLUCAP 363 (H) 08/05/2017   GLUCAP 205 (H) 08/05/2017     Exercise Target Goals: Exercise Program Goal: Individual exercise prescription set using results from initial 6 min walk test and THRR while considering  patient's activity barriers and safety.   Exercise Prescription Goal: Starting with aerobic activity 30 plus minutes a day, 3 days per week for initial exercise prescription. Provide home exercise prescription and guidelines that participant acknowledges understanding prior to discharge.  Activity Barriers & Risk Stratification: Activity Barriers & Cardiac Risk Stratification - 04/30/18 1706      Activity Barriers & Cardiac Risk Stratification   Activity Barriers  Back Problems;Assistive Device;Balance Concerns;Deconditioning   (R) leg weakness   Cardiac Risk Stratification  High       6 Minute Walk: 6 Minute Walk    Row Name 04/29/18 1702         6 Minute Walk   Phase  Initial     Distance  1150 feet     Walk Time  6 minutes     # of Rest Breaks  0     MPH  2.17     METS  2.66     RPE  11     Perceived Dyspnea   9     VO2 Peak  8.53     Symptoms  No     Resting HR  64 bpm     Resting BP  150/80     Resting Oxygen Saturation   98 %     Exercise Oxygen Saturation  during 6 min walk  98 %     Max Ex. HR  78 bpm     Max Ex. BP  174/92     2 Minute Post BP  168/88        Oxygen Initial  Assessment:   Oxygen Re-Evaluation:   Oxygen Discharge (Final Oxygen Re-Evaluation):   Initial Exercise Prescription: Initial Exercise Prescription - 04/30/18 1700  Date of Initial Exercise RX and Referring Provider   Date  04/29/18    Referring Provider  [name]    Expected Discharge Date  08/09/18      Treadmill   MPH  1    Grade  0    Minutes  17    METs  1.7      Arm Ergometer   Level  1    Watts  11    RPM  52    Minutes  17    METs  1.8      T5 Nustep   Level  1    SPM  54    Minutes  17    METs  1.7      Prescription Details   Frequency (times per week)  3    Duration  Progress to 30 minutes of continuous aerobic without signs/symptoms of physical distress      Intensity   THRR 40-80% of Max Heartrate  [phone]    Ratings of Perceived Exertion  11-13    Perceived Dyspnea  0-4      Progression   Progression  Continue progressive overload as per policy without signs/symptoms or physical distress.      Resistance Training   Training Prescription  Yes    Weight  1    Reps  10-15       Perform Capillary Blood Glucose checks as needed.  Exercise Prescription Changes:  Exercise Prescription Changes    Row Name 05/13/18 0700 05/27/18 1000           Response to Exercise   Blood Pressure (Admit)  126/60  138/74      Blood Pressure (Exercise)  130/70  150/74      Blood Pressure (Exit)  144/70  140/72      Heart Rate (Admit)  52 bpm  55 bpm      Heart Rate (Exercise)  63 bpm  73 bpm      Heart Rate (Exit)  61 bpm  64 bpm      Rating of Perceived Exertion (Exercise)  11  11      Duration  Progress to 30 minutes of  aerobic without signs/symptoms of physical distress  Progress to 30 minutes of  aerobic without signs/symptoms of physical distress      Intensity  THRR New [phone]  THRR unchanged        Progression   Progression  Continue to progress workloads to maintain intensity without signs/symptoms of physical distress.  Continue to  progress workloads to maintain intensity without signs/symptoms of physical distress.      Average METs  1.95  2.05        Resistance Training   Training Prescription  Yes  Yes      Weight  1  3      Reps  10-15  10-15        Treadmill   MPH  1.3  1.3      Grade  0  0      Minutes  17  17      METs  1.9  1.9        T5 Nustep   Level  1  1      SPM  93  112      Minutes  22  22      METs  2  2.2        Home Exercise Plan   Plans  to continue exercise at  Home (comment)  Home (comment)      Frequency  Add 2 additional days to program exercise sessions.  Add 2 additional days to program exercise sessions.      Initial Home Exercises Provided  05/05/18  05/05/18         Exercise Comments:  Exercise Comments    Row Name 05/17/18 1507 05/27/18 1036 06/07/18 0732       Exercise Comments  Patient is progressing nicely. He is only had 6 visits. His has gained 7.6lbs since his last visit on 05/14/18. Instructed patient to get a scale and weight and keep a record. He has been able to handle the increases in hand held weights and equipment w/o any abnormal S/S.   Patient is progressing nicely. He has had 9 visits. His has lost 5lbs since his last visit on 05/21/18. Instructed patient to get a scale and weight and keep a record. He has been able to handle the increases in hand held weights and equipment w/o any abnormal S/S.   Patient is doing well. He is on his 13 visit. He has not lost any wieght since diuretic, he is holding steady at current weight.         Exercise Goals and Review:  Exercise Goals    Row Name 04/30/18 1715             Exercise Goals   Increase Physical Activity  Yes       Intervention  Develop an individualized exercise prescription for aerobic and resistive training based on initial evaluation findings, risk stratification, comorbidities and participant's personal goals.;Provide advice, education, support and counseling about physical activity/exercise needs.        Expected Outcomes  Short Term: Attend rehab on a regular basis to increase amount of physical activity.;Long Term: Exercising regularly at least 3-5 days a week.       Increase Strength and Stamina  Yes       Intervention  Provide advice, education, support and counseling about physical activity/exercise needs.;Develop an individualized exercise prescription for aerobic and resistive training based on initial evaluation findings, risk stratification, comorbidities and participant's personal goals.       Expected Outcomes  Short Term: Perform resistance training exercises routinely during rehab and add in resistance training at home;Short Term: Increase workloads from initial exercise prescription for resistance, speed, and METs.;Long Term: Improve cardiorespiratory fitness, muscular endurance and strength as measured by increased METs and functional capacity (6MWT)       Able to understand and use rate of perceived exertion (RPE) scale  Yes       Intervention  Provide education and explanation on how to use RPE scale       Expected Outcomes  Short Term: Able to use RPE daily in rehab to express subjective intensity level;Long Term:  Able to use RPE to guide intensity level when exercising independently       Knowledge and understanding of Target Heart Rate Range (THRR)  Yes       Intervention  Provide education and explanation of THRR including how the numbers were predicted and where they are located for reference       Expected Outcomes  Short Term: Able to state/look up THRR;Long Term: Able to use THRR to govern intensity when exercising independently       Able to check pulse independently  Yes       Intervention  Provide education and demonstration on how to  check pulse in carotid and radial arteries.;Review the importance of being able to check your own pulse for safety during independent exercise       Expected Outcomes  Short Term: Able to explain why pulse checking is important during  independent exercise;Long Term: Able to check pulse independently and accurately       Understanding of Exercise Prescription  Yes       Intervention  Provide education, explanation, and written materials on patient's individual exercise prescription       Expected Outcomes  Short Term: Able to explain program exercise prescription;Long Term: Able to explain home exercise prescription to exercise independently          Exercise Goals Re-Evaluation : Exercise Goals Re-Evaluation    Row Name 05/17/18 1503 05/27/18 1032 06/07/18 0724         Exercise Goal Re-Evaluation   Exercise Goals Review  Increase Physical Activity;Increase Strength and Stamina;Able to check pulse independently;Knowledge and understanding of Target Heart Rate Range (THRR)  Increase Physical Activity;Increase Strength and Stamina;Able to check pulse independently;Knowledge and understanding of Target Heart Rate Range (THRR)  Increase Physical Activity;Increase Strength and Stamina;Knowledge and understanding of Target Heart Rate Range (THRR);Able to check pulse independently;Understanding of Exercise Prescription;Able to understand and use rate of perceived exertion (RPE) scale     Comments  -  Patient is progressing well in the program and working hard to improve activity. He has increased his overall MET level as well as his dumbbell weight. He was put on a diuretic to aid in losing weight and decrease the amount of fluid.   Patient is progressing well in the program and working hard to improve activity. He has increased his overall MET level as well as his dumbbell weight. He was put on a diuretic to aid in losing weight and decrease the amount of fluid. His weight is holding steady at the same place since given the diuretic.      Expected Outcomes  Improve activity and lose 40lbs  Improve activity and lose 40lbs  Improve activity and lose 40lbs         Discharge Exercise Prescription (Final Exercise Prescription  Changes): Exercise Prescription Changes - 05/27/18 1000      Response to Exercise   Blood Pressure (Admit)  138/74    Blood Pressure (Exercise)  150/74    Blood Pressure (Exit)  140/72    Heart Rate (Admit)  55 bpm    Heart Rate (Exercise)  73 bpm    Heart Rate (Exit)  64 bpm    Rating of Perceived Exertion (Exercise)  11    Duration  Progress to 30 minutes of  aerobic without signs/symptoms of physical distress    Intensity  THRR unchanged      Progression   Progression  Continue to progress workloads to maintain intensity without signs/symptoms of physical distress.    Average METs  2.05      Resistance Training   Training Prescription  Yes    Weight  3    Reps  10-15      Treadmill   MPH  1.3    Grade  0    Minutes  17    METs  1.9      T5 Nustep   Level  1    SPM  112    Minutes  22    METs  2.2      Home Exercise Plan   Plans to continue exercise at  Home (comment)    Frequency  Add 2 additional days to program exercise sessions.    Initial Home Exercises Provided  05/05/18       Nutrition:  Target Goals: Understanding of nutrition guidelines, daily intake of sodium '1500mg'$ , cholesterol '200mg'$ , calories 30% from fat and 7% or less from saturated fats, daily to have 5 or more servings of fruits and vegetables.  Biometrics: Pre Biometrics - 04/30/18 1718      Pre Biometrics   Height  '5\' 11"'$  (1.803 m)    Weight  128.4 kg    Waist Circumference  53 inches    Hip Circumference  43 inches    Waist to Hip Ratio  1.23 %    BMI (Calculated)  39.49    Triceps Skinfold  20 mm    % Body Fat  38.4 %    Grip Strength  29.8 kg   kilograms   Flexibility  10.2 in    Single Leg Stand  38 seconds        Nutrition Therapy Plan and Nutrition Goals: Nutrition Therapy & Goals - 05/27/18 1259      Personal Nutrition Goals   Nutrition Goal  For heart healthy choices add >50% of whole grains, make half their plate fruits and vegetables. Discuss the difference between  starchy vegetables and leafy greens, and how leafy vegetables provide fiber, helps maintain healthy weight, helps control blood glucose, and lowers cholesterol.  Discuss purchasing fresh or frozen vegetable to reduce sodium and not to add grease, fat or sugar. Consume <18oz of red meat per week. Consume lean cuts of meats and very little of meats high in sodium and nitrates such as pork and lunch meats. Discussed portion control for all food groups.    Additional Goals?  No      Intervention Plan   Intervention  Nutrition handout(s) given to patient.    Expected Outcomes  Short Term Goal: Understand basic principles of dietary content, such as calories, fat, sodium, cholesterol and nutrients.       Nutrition Assessments: Nutrition Assessments - 04/30/18 1724      MEDFICTS Scores   Pre Score  24       Nutrition Goals Re-Evaluation: Nutrition Goals Re-Evaluation    Row Name 06/07/18 1322             Goals   Current Weight  288 lb (130.6 kg)       Nutrition Goal  For heart healthy choices add >50% of whole grains, make half their plate fruits and vegetables. Discuss the difference between starchy vegetables and leafy greens, and how leafy vegetables provide fiber, helps maintain healthy weight, helps control blood glucose, and lowers cholesterol.  Discuss purchasing fresh or frozen vegetable to reduce sodium and not to add grease, fat or sugar. Consume <18oz of red meat per week. Consume lean cuts of meats and very little of meats high in sodium and nitrates such as pork and lunch meats. Discussed portion control for all food groups.       Comment  Patient has gained 5 lbs since last 30 day review. His weight does fluctuate. He does increase his Lasix dosage per his MD when he gaines. He continues to say he is eating heart healthy and also following a diabetic diet.        Expected Outcome  Patient will continue to follow a heart healthy diabetic diet.           Nutrition Goals  Discharge (Final Nutrition Goals Re-Evaluation): Nutrition Goals Re-Evaluation - 06/07/18 1322      Goals   Current Weight  288 lb (130.6 kg)    Nutrition Goal  For heart healthy choices add >50% of whole grains, make half their plate fruits and vegetables. Discuss the difference between starchy vegetables and leafy greens, and how leafy vegetables provide fiber, helps maintain healthy weight, helps control blood glucose, and lowers cholesterol.  Discuss purchasing fresh or frozen vegetable to reduce sodium and not to add grease, fat or sugar. Consume <18oz of red meat per week. Consume lean cuts of meats and very little of meats high in sodium and nitrates such as pork and lunch meats. Discussed portion control for all food groups.    Comment  Patient has gained 5 lbs since last 30 day review. His weight does fluctuate. He does increase his Lasix dosage per his MD when he gaines. He continues to say he is eating heart healthy and also following a diabetic diet.     Expected Outcome  Patient will continue to follow a heart healthy diabetic diet.        Psychosocial: Target Goals: Acknowledge presence or absence of significant depression and/or stress, maximize coping skills, provide positive support system. Participant is able to verbalize types and ability to use techniques and skills needed for reducing stress and depression.  Initial Review & Psychosocial Screening: Initial Psych Review & Screening - 04/30/18 1721      Initial Review   Current issues with  None Identified      Family Dynamics   Good Support System?  Yes      Barriers   Psychosocial barriers to participate in program  There are no identifiable barriers or psychosocial needs.      Screening Interventions   Interventions  Encouraged to exercise    Expected Outcomes  Short Term goal: Identification and review with participant of any Quality of Life or Depression concerns found by scoring the questionnaire.;Long Term goal:  The participant improves quality of Life and PHQ9 Scores as seen by post scores and/or verbalization of changes       Quality of Life Scores: Quality of Life - 04/30/18 1722      Quality of Life   Select  Quality of Life      Quality of Life Scores   Health/Function Pre  19.29 %    Socioeconomic Pre  18 %    Psych/Spiritual Pre  24.86 %    Family Pre  24 %    GLOBAL Pre  20.91 %      Scores of 19 and below usually indicate a poorer quality of life in these areas.  A difference of  2-3 points is a clinically meaningful difference.  A difference of 2-3 points in the total score of the Quality of Life Index has been associated with significant improvement in overall quality of life, self-image, physical symptoms, and general health in studies assessing change in quality of life.  PHQ-9: Recent Review Flowsheet Data    There is no flowsheet data to display.     Interpretation of Total Score  Total Score Depression Severity:  1-4 = Minimal depression, 5-9 = Mild depression, 10-14 = Moderate depression, 15-19 = Moderately severe depression, 20-27 = Severe depression   Psychosocial Evaluation and Intervention: Psychosocial Evaluation - 04/30/18 1722      Psychosocial Evaluation & Interventions   Interventions  Encouraged to exercise with the program and follow exercise prescription  Continue Psychosocial Services   No Follow up required       Psychosocial Re-Evaluation: Psychosocial Re-Evaluation    [place] Name 05/17/18 1414 06/07/18 1343           Psychosocial Re-Evaluation   Current issues with  None Identified  None Identified      Comments  Patient's initial QOL score was 20.91 and his PHQ-9 score was 3 with no psychosocial issues identified at orientation.  Patient's initial QOL score was 20.91 and his PHQ-9 score was 3 and continues to have no psychosocial issues identified.      Expected Outcomes  Patient will not have any psychosocial issues identified at [place].    Patient will not have any psychosocial issues identified at [place].       Interventions  Stress management education;Relaxation education;Encouraged to attend Cardiac Rehabilitation for the exercise  Stress management education;Relaxation education;Encouraged to attend Cardiac Rehabilitation for the exercise      Continue Psychosocial Services   No Follow up required  No Follow up required         Psychosocial Discharge (Final Psychosocial Re-Evaluation): Psychosocial Re-Evaluation - 06/07/18 1343      Psychosocial Re-Evaluation   Current issues with  None Identified    Comments  Patient's initial QOL score was 20.91 and his PHQ-9 score was 3 and continues to have no psychosocial issues identified.    Expected Outcomes  Patient will not have any psychosocial issues identified at [place].     Interventions  Stress management education;Relaxation education;Encouraged to attend Cardiac Rehabilitation for the exercise    Continue Psychosocial Services   No Follow up required       Vocational Rehabilitation: Provide vocational rehab assistance to qualifying candidates.   Vocational Rehab Evaluation & Intervention: Vocational Rehab - 04/30/18 1725      Initial Vocational Rehab Evaluation & Intervention   Assessment shows need for Vocational Rehabilitation  No   Disabled      Education: Education Goals: Education classes will be provided on a weekly basis, covering required topics. Participant will state understanding/return demonstration of topics presented.  Learning Barriers/Preferences: Learning Barriers/Preferences - 04/30/18 1724      Learning Barriers/Preferences   Learning Barriers  None    Learning Preferences  Individual Instruction;Group Instruction       Education Topics: Hypertension, Hypertension Reduction -Define heart disease and high blood pressure. Discus how high blood pressure affects the body and ways to reduce high blood pressure.   Exercise and  Your Heart -Discuss why it is important to exercise, the FITT principles of exercise, normal and abnormal responses to exercise, and how to exercise safely.   CARDIAC REHAB PHASE II EXERCISE from 06/02/2018 in [facility]  Date  05/05/18  Educator  D. Coad  Instruction Review Code  2- Demonstrated Understanding      Angina -Discuss definition of angina, causes of angina, treatment of angina, and how to decrease risk of having angina.   Cardiac Medications -Review what the following cardiac medications are used for, how they affect the body, and side effects that may occur when taking the medications.  Medications include Aspirin, Beta blockers, calcium channel blockers, ACE Inhibitors, angiotensin receptor blockers, diuretics, digoxin, and antihyperlipidemics.   CARDIAC REHAB PHASE II EXERCISE from 06/02/2018 in [facility]  Date  05/19/18  Educator  Etheleen Mayhew  Instruction Review Code  2- Demonstrated Understanding      Congestive Heart Failure -Discuss the definition of CHF, how  to live with CHF, the signs and symptoms of CHF, and how keep track of weight and sodium intake.   CARDIAC REHAB PHASE II EXERCISE from 06/02/2018 in [place]  Date  05/26/18  Educator  D. Coad   Instruction Review Code  2- Demonstrated Understanding      Heart Disease and Intimacy -Discus the effect sexual activity has on the heart, how changes occur during intimacy as we age, and safety during sexual activity.   CARDIAC REHAB PHASE II EXERCISE from 06/02/2018 in [place]  Date  06/02/18  Educator  Etheleen Mayhew  Instruction Review Code  2- Demonstrated Understanding      Smoking Cessation / COPD -Discuss different methods to quit smoking, the health benefits of quitting smoking, and the definition of COPD.   Nutrition I: Fats -Discuss the types of cholesterol, what cholesterol does to the heart, and how  cholesterol levels can be controlled.   Nutrition II: Labels -Discuss the different components of food labels and how to read food label   Heart Parts/Heart Disease and PAD -Discuss the anatomy of the heart, the pathway of blood circulation through the heart, and these are affected by heart disease.   Stress I: Signs and Symptoms -Discuss the causes of stress, how stress may lead to anxiety and depression, and ways to limit stress.   Stress II: Relaxation -Discuss different types of relaxation techniques to limit stress.   Warning Signs of Stroke / TIA -Discuss definition of a stroke, what the signs and symptoms are of a stroke, and how to identify when someone is having stroke.   Knowledge Questionnaire Score: Knowledge Questionnaire Score - 04/30/18 1725      Knowledge Questionnaire Score   Pre Score  21/24       Core Components/Risk Factors/Patient Goals at Admission: Personal Goals and Risk Factors at Admission - 04/30/18 1725      Core Components/Risk Factors/Patient Goals on Admission    Weight Management  Yes    Intervention  Weight Management/Obesity: Establish reasonable short term and long term weight goals.    Admit Weight  283 lb (128.4 kg)    Goal Weight: Short Term  273 lb (123.8 kg)    Goal Weight: Long Term  263 lb (119.3 kg)    Expected Outcomes  Short Term: Continue to assess and modify interventions until short term weight is achieved;Long Term: Adherence to nutrition and physical activity/exercise program aimed toward attainment of established weight goal    Personal Goal Other  Yes    Personal Goal  Improve activity, Lose 40 lbs overall. Will work at losing 15-20lbs while in program.     Intervention  Attend program 3 x week and supplement exercise at home 2 x week.     Expected Outcomes  Reach personal goals.        Core Components/Risk Factors/Patient Goals Review:  Goals and Risk Factor Review    Row Name 05/17/18 1408 06/07/18 1325            Core Components/Risk Factors/Patient Goals Review   Personal Goals Review  Weight Management/Obesity;Diabetes;Hypertension Improve activity; lose weigth 40 lbs long term.  Weight Management/Obesity;Diabetes;Hypertension Improve activity; lose weight 40 lbs.       Review  Patient has completed 6 sessions gaining 7 lbs since his orientation visit. He says he feels better after only 6 sessions with increased strength. He says he recently had an A1C which was 6.7 down from 7.5 on  his previous one. His blood pressure remains hypertensive. Will continue to monitor for progress.   Patient has completed 14 sessions gaining 5 lbs since last 30 day review. His weight does fluctuate due to fluid. He has been told by MD to increase his Lasix dosage for 3 days if he gains over 5 lbs in one week. No recent A1C. His blood pressure does continue to fluctuate based on his fluid status. He continues to do well in the program with progression. He continues to say he is feeling stronger with increased energy. He says he is doing more at home now without difficulty. He is very pleased with his progress in the program and says he feels better overall. Will continue to monitor for progress.        Expected Outcomes  Patient will continue to attend sessions and complete the program meeting his personal goals.   Patient will continue to attend sessions and complete the program meeting his personal goals.          Core Components/Risk Factors/Patient Goals at Discharge (Final Review):  Goals and Risk Factor Review - 06/07/18 1325      Core Components/Risk Factors/Patient Goals Review   Personal Goals Review  Weight Management/Obesity;Diabetes;Hypertension   Improve activity; lose weight 40 lbs.    Review  Patient has completed 14 sessions gaining 5 lbs since last 30 day review. His weight does fluctuate due to fluid. He has been told by MD to increase his Lasix dosage for 3 days if he gains over 5 lbs in one week. No  recent A1C. His blood pressure does continue to fluctuate based on his fluid status. He continues to do well in the program with progression. He continues to say he is feeling stronger with increased energy. He says he is doing more at home now without difficulty. He is very pleased with his progress in the program and says he feels better overall. Will continue to monitor for progress.      Expected Outcomes  Patient will continue to attend sessions and complete the program meeting his personal goals.        ITP Comments: ITP Comments    Row Name 04/30/18 1519 05/03/18 0734 05/27/18 1259       ITP Comments  Patient was sent from [facility] 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 He is eager to get started.   Patient new to program. Plans to start Wednesday 05/05/18.  Patient attended family life matter class with hospital chaplian to discuss how this recent event has impacted his life.         Comments: ITP 30 Day REVIEW Patient is doing well in the program. Will continue to monitor for progress.

## 2018-06-09 ENCOUNTER — Encounter (HOSPITAL_COMMUNITY)
Admission: RE | Admit: 2018-06-09 | Discharge: 2018-06-09 | Disposition: A | Discharge status: Home / Self Care | Payer: Medicaid Other | Source: Ambulatory Visit | Attending: Internal Medicine | Admitting: Internal Medicine

## 2018-06-09 DIAGNOSIS — I428 Other cardiomyopathies: Secondary | ICD-10-CM

## 2018-06-09 NOTE — Progress Notes (Addendum)
Daily Session Note  Patient Details  Name: Michael Vaughn MRN: [id number] Date of Birth: 07-11-1961 Referring Provider:     CARDIAC REHAB PHASE II ORIENTATION from 04/29/2018 in [place]  Referring Provider  [name]      Encounter Date: 06/09/2018  Check In: Session Check In - 06/09/18 1100      Check-In   Supervising physician immediately available to respond to emergencies  See telemetry face sheet for immediately available MD    Location  [facility]    Staff Present  Benay Pike, Exercise Physiologist;Jann Milkovich Wynetta Emery, RN, BSN;Diane Coad, MS, EP, [facility], Exercise Physiologist    Medication changes reported      No    Fall or balance concerns reported     Yes    Comments  Due to his back surgery his (R) leg gets weak and numb.     Warm-up and Cool-down  Performed as group-led Higher education careers adviser Performed  Yes    VAD Patient?  No    PAD/SET Patient?  No      Pain Assessment   Currently in Pain?  No/denies    Pain Score  0-No pain    Multiple Pain Sites  No       Capillary Blood Glucose: No results found for this or any previous visit (from the past 24 hour(s)).    Social History   Tobacco Use  Smoking Status Former Smoker  . Packs/day: 1.00  . Years: 5.00  . Pack years: 5.00  . Types: Cigarettes  . Last attempt to quit: 09/22/1989  . Years since quitting: 28.7  Smokeless Tobacco Former Systems developer  . Types: Snuff  . Quit date: 09/22/1989  Tobacco Comment   dipped snuff for one year    Goals Met:  Independence with exercise equipment Exercise tolerated well No report of cardiac concerns or symptoms Strength training completed today  Goals Unmet:  Not Applicable  Comments: Pt able to follow exercise prescription today without complaint.  Will continue to monitor for progression. Check out 1200.   Dr. Kate Sable is Medical Director for [facility] Cardiac and Pulmonary Rehab.

## 2018-06-11 ENCOUNTER — Encounter (HOSPITAL_COMMUNITY)
Admission: RE | Admit: 2018-06-11 | Discharge: 2018-06-11 | Disposition: A | Discharge status: Home / Self Care | Payer: Medicaid Other | Source: Ambulatory Visit | Attending: Internal Medicine | Admitting: Internal Medicine

## 2018-06-11 DIAGNOSIS — I428 Other cardiomyopathies: Secondary | ICD-10-CM | POA: Diagnosis not present

## 2018-06-11 NOTE — Progress Notes (Signed)
Daily Session Note  Patient Details  Name: Michael Vaughn MRN: [id number] Date of Birth: [birth year] Referring Provider:     CARDIAC REHAB PHASE II ORIENTATION from 04/29/2018 in [place]  Referring Provider  [name]      Encounter Date: 06/11/2018  Check In: Session Check In - 06/11/18 1100      Check-In   Supervising physician immediately available to respond to emergencies  See telemetry face sheet for immediately available MD    Location  [facility]    Staff Present  Benay Pike, Exercise Physiologist;Diane Coad, MS, EP, [facility], Exercise Physiologist;Rani Idler Wynetta Emery, RN, BSN    Medication changes reported      No    Fall or balance concerns reported     Yes    Comments  Due to his back surgery his (R) leg gets weak and numb.     Warm-up and Cool-down  Performed as group-led Higher education careers adviser Performed  Yes    VAD Patient?  No    PAD/SET Patient?  No      Pain Assessment   Currently in Pain?  No/denies    Pain Score  0-No pain    Multiple Pain Sites  No       Capillary Blood Glucose: No results found for this or any previous visit (from the past 24 hour(s)).    Social History   Tobacco Use  Smoking Status Former Smoker  . Packs/day: 1.00  . Years: 5.00  . Pack years: 5.00  . Types: Cigarettes  . Last attempt to quit: 09/22/1989  . Years since quitting: 28.7  Smokeless Tobacco Former Systems developer  . Types: Snuff  . Quit date: 09/22/1989  Tobacco Comment   dipped snuff for one year    Goals Met:  Independence with exercise equipment Exercise tolerated well No report of cardiac concerns or symptoms Strength training completed today  Goals Unmet:  Not Applicable  Comments: Pt able to follow exercise prescription today without complaint.  Will continue to monitor for progression. Check out 1200.   Dr. Kate Sable is Medical Director for [facility] Cardiac and Pulmonary Rehab.

## 2018-06-14 ENCOUNTER — Encounter (HOSPITAL_COMMUNITY): Payer: Medicaid Other

## 2018-06-16 ENCOUNTER — Encounter (HOSPITAL_COMMUNITY)
Admission: RE | Admit: 2018-06-16 | Discharge: 2018-06-16 | Disposition: A | Discharge status: Home / Self Care | Payer: Medicaid Other | Source: Ambulatory Visit | Attending: Internal Medicine | Admitting: Internal Medicine

## 2018-06-16 DIAGNOSIS — I428 Other cardiomyopathies: Secondary | ICD-10-CM | POA: Diagnosis not present

## 2018-06-16 NOTE — Progress Notes (Signed)
Daily Session Note  Patient Details  Name: Michael Vaughn MRN: [id number] Date of Birth: [birth year] Referring Provider:     CARDIAC REHAB PHASE II ORIENTATION from 04/29/2018 in [place]  Referring Provider  [name]      Encounter Date: 06/16/2018  Check In: Session Check In - 06/16/18 1100      Check-In   Supervising physician immediately available to respond to emergencies  See telemetry face sheet for immediately available MD    Location  [facility]    Staff Present  Benay Pike, Exercise Physiologist;Debra Wynetta Emery, RN, BSN    Medication changes reported      No    Fall or balance concerns reported     Yes    Comments  Due to his back surgery his (R) leg gets weak and numb.     Tobacco Cessation  No Change    Warm-up and Cool-down  Performed as group-led instruction    Resistance Training Performed  Yes    VAD Patient?  No    PAD/SET Patient?  No      Pain Assessment   Currently in Pain?  No/denies    Pain Score  0-No pain    Multiple Pain Sites  No       Capillary Blood Glucose: No results found for this or any previous visit (from the past 24 hour(s)).    Social History   Tobacco Use  Smoking Status Former Smoker  . Packs/day: 1.00  . Years: 5.00  . Pack years: 5.00  . Types: Cigarettes  . Last attempt to quit: 09/22/1989  . Years since quitting: 28.7  Smokeless Tobacco Former Systems developer  . Types: Snuff  . Quit date: 09/22/1989  Tobacco Comment   dipped snuff for one year    Goals Met:  Independence with exercise equipment Exercise tolerated well No report of cardiac concerns or symptoms Strength training completed today  Goals Unmet:  Not Applicable  Comments: Pt able to follow exercise prescription today without complaint.  Will continue to monitor for progression. Check out 12:00.   Dr. Kate Sable is Medical Director for [facility] Cardiac and Pulmonary Rehab.

## 2018-06-18 ENCOUNTER — Encounter (HOSPITAL_COMMUNITY)
Admission: RE | Admit: 2018-06-18 | Discharge: 2018-06-18 | Disposition: A | Discharge status: Home / Self Care | Payer: Medicaid Other | Source: Ambulatory Visit | Attending: Internal Medicine | Admitting: Internal Medicine

## 2018-06-18 DIAGNOSIS — I428 Other cardiomyopathies: Secondary | ICD-10-CM | POA: Diagnosis not present

## 2018-06-18 LAB — GLUCOSE, CAPILLARY: GLUCOSE-CAPILLARY: 136 mg/dL — AB (ref 70–99)

## 2018-06-18 NOTE — Progress Notes (Addendum)
Daily Session Note  Patient Details  Name: Michael Vaughn MRN: [id number] Date of Birth: 1960-10-31 Referring Provider:     CARDIAC REHAB PHASE II ORIENTATION from 04/29/2018 in [place]  Referring Provider  [name]      Encounter Date: 06/18/2018  Check In: Session Check In - 06/18/18 1100      Check-In   Supervising physician immediately available to respond to emergencies  See telemetry face sheet for immediately available MD    Location  [facility]    Staff Present  Benay Pike, Exercise Physiologist;Floy Riegler Wynetta Emery, RN, BSN;Diane Coad, MS, EP, [facility], Exercise Physiologist    Medication changes reported      No    Fall or balance concerns reported     Yes    Comments  Due to his back surgery his (R) leg gets weak and numb.     Warm-up and Cool-down  Performed as group-led Higher education careers adviser Performed  Yes    VAD Patient?  No    PAD/SET Patient?  No      Pain Assessment   Currently in Pain?  No/denies    Pain Score  0-No pain    Multiple Pain Sites  No       Capillary Blood Glucose: No results found for this or any previous visit (from the past 24 hour(s)).    Social History   Tobacco Use  Smoking Status Former Smoker  . Packs/day: 1.00  . Years: 5.00  . Pack years: 5.00  . Types: Cigarettes  . Last attempt to quit: 09/22/1989  . Years since quitting: 28.7  Smokeless Tobacco Former Systems developer  . Types: Snuff  . Quit date: 09/22/1989  Tobacco Comment   dipped snuff for one year    Goals Met:  Independence with exercise equipment Exercise tolerated well No report of cardiac concerns or symptoms Strength training completed today  Goals Unmet:  Not Applicable  Comments: Patient complained of feeling "sluggish" today while on the treadmill. He says he started feeling this way on his drive over and felt like his blood sugar may be low. We check it at 136 mg/dl. He wanted to finish the session.  Check out 1200  without additional complaints.    Dr. Kate Sable is Medical Director for [facility] Cardiac and Pulmonary Rehab.

## 2018-06-21 ENCOUNTER — Encounter (HOSPITAL_COMMUNITY)
Admission: RE | Admit: 2018-06-21 | Discharge: 2018-06-21 | Disposition: A | Discharge status: Home / Self Care | Payer: Medicaid Other | Source: Ambulatory Visit | Attending: Internal Medicine | Admitting: Internal Medicine

## 2018-06-21 DIAGNOSIS — I428 Other cardiomyopathies: Secondary | ICD-10-CM

## 2018-06-21 NOTE — Progress Notes (Signed)
Daily Session Note  Patient Details  Name: Michael Vaughn MRN: [id number] Date of Birth: 1961/03/24 Referring Provider:     CARDIAC REHAB PHASE II ORIENTATION from 04/29/2018 in [place]  Referring Provider  [name]      Encounter Date: 06/21/2018  Check In: Session Check In - 06/21/18 1100      Check-In   Supervising physician immediately available to respond to emergencies  See telemetry face sheet for immediately available MD    Location  [facility]    Staff Present  Benay Pike, Exercise Physiologist;Debra Wynetta Emery, RN, BSN    Medication changes reported      No    Fall or balance concerns reported     Yes    Comments  Due to his back surgery his (R) leg gets weak and numb.     Tobacco Cessation  No Change    Warm-up and Cool-down  Performed as group-led instruction    Resistance Training Performed  Yes    VAD Patient?  No    PAD/SET Patient?  No      Pain Assessment   Currently in Pain?  No/denies    Pain Score  0-No pain    Multiple Pain Sites  No       Capillary Blood Glucose: No results found for this or any previous visit (from the past 24 hour(s)).    Social History   Tobacco Use  Smoking Status Former Smoker  . Packs/day: 1.00  . Years: 5.00  . Pack years: 5.00  . Types: Cigarettes  . Last attempt to quit: 09/22/1989  . Years since quitting: 28.7  Smokeless Tobacco Former Systems developer  . Types: Snuff  . Quit date: 09/22/1989  Tobacco Comment   dipped snuff for one year    Goals Met:  Independence with exercise equipment Exercise tolerated well No report of cardiac concerns or symptoms Strength training completed today  Goals Unmet:  Not Applicable  Comments: Pt able to follow exercise prescription today without complaint.  Will continue to monitor for progression. Check out 12:00.   Dr. Kate Sable is Medical Director for [facility] Cardiac and Pulmonary Rehab.

## 2018-06-23 ENCOUNTER — Encounter (HOSPITAL_COMMUNITY)
Admission: RE | Admit: 2018-06-23 | Discharge: 2018-06-23 | Disposition: A | Discharge status: Home / Self Care | Payer: Medicaid Other | Source: Ambulatory Visit | Attending: Internal Medicine | Admitting: Internal Medicine

## 2018-06-23 DIAGNOSIS — Z794 Long term (current) use of insulin: Secondary | ICD-10-CM | POA: Insufficient documentation

## 2018-06-23 DIAGNOSIS — I252 Old myocardial infarction: Secondary | ICD-10-CM | POA: Insufficient documentation

## 2018-06-23 DIAGNOSIS — Z7982 Long term (current) use of aspirin: Secondary | ICD-10-CM | POA: Diagnosis not present

## 2018-06-23 DIAGNOSIS — K219 Gastro-esophageal reflux disease without esophagitis: Secondary | ICD-10-CM | POA: Insufficient documentation

## 2018-06-23 DIAGNOSIS — Z87891 Personal history of nicotine dependence: Secondary | ICD-10-CM | POA: Diagnosis not present

## 2018-06-23 DIAGNOSIS — M519 Unspecified thoracic, thoracolumbar and lumbosacral intervertebral disc disorder: Secondary | ICD-10-CM | POA: Insufficient documentation

## 2018-06-23 DIAGNOSIS — I428 Other cardiomyopathies: Secondary | ICD-10-CM | POA: Diagnosis present

## 2018-06-23 DIAGNOSIS — N400 Enlarged prostate without lower urinary tract symptoms: Secondary | ICD-10-CM | POA: Diagnosis not present

## 2018-06-23 DIAGNOSIS — G4733 Obstructive sleep apnea (adult) (pediatric): Secondary | ICD-10-CM | POA: Insufficient documentation

## 2018-06-23 DIAGNOSIS — I10 Essential (primary) hypertension: Secondary | ICD-10-CM | POA: Diagnosis not present

## 2018-06-23 DIAGNOSIS — E785 Hyperlipidemia, unspecified: Secondary | ICD-10-CM | POA: Diagnosis not present

## 2018-06-23 DIAGNOSIS — I2510 Atherosclerotic heart disease of native coronary artery without angina pectoris: Secondary | ICD-10-CM | POA: Diagnosis not present

## 2018-06-23 DIAGNOSIS — Z79899 Other long term (current) drug therapy: Secondary | ICD-10-CM | POA: Insufficient documentation

## 2018-06-23 DIAGNOSIS — E119 Type 2 diabetes mellitus without complications: Secondary | ICD-10-CM | POA: Diagnosis not present

## 2018-06-23 DIAGNOSIS — Z6839 Body mass index (BMI) 39.0-39.9, adult: Secondary | ICD-10-CM | POA: Diagnosis not present

## 2018-06-23 NOTE — Progress Notes (Signed)
Daily Session Note  Patient Details  Name: Michael Vaughn MRN: [id number] Date of Birth: 09/15/1961 Referring Provider:     CARDIAC REHAB PHASE II ORIENTATION from 04/29/2018 in [place]  Referring Provider  [name]      Encounter Date: 06/23/2018  Check In: Session Check In - 06/23/18 1100      Check-In   Supervising physician immediately available to respond to emergencies  See telemetry face sheet for immediately available MD    Location  [facility]    Staff Present  Benay Pike, Exercise Physiologist;Pura Picinich Wynetta Emery, RN, BSN    Medication changes reported      No    Fall or balance concerns reported     Yes    Comments  Due to his back surgery his (R) leg gets weak and numb.     Warm-up and Cool-down  Performed as group-led Higher education careers adviser Performed  Yes    VAD Patient?  No    PAD/SET Patient?  No      Pain Assessment   Currently in Pain?  No/denies    Pain Score  0-No pain    Multiple Pain Sites  No       Capillary Blood Glucose: No results found for this or any previous visit (from the past 24 hour(s)).    Social History   Tobacco Use  Smoking Status Former Smoker  . Packs/day: 1.00  . Years: 5.00  . Pack years: 5.00  . Types: Cigarettes  . Last attempt to quit: 09/22/1989  . Years since quitting: 28.7  Smokeless Tobacco Former Systems developer  . Types: Snuff  . Quit date: 09/22/1989  Tobacco Comment   dipped snuff for one year    Goals Met:  Independence with exercise equipment Exercise tolerated well No report of cardiac concerns or symptoms Strength training completed today  Goals Unmet:  Not Applicable  Comments: Pt able to follow exercise prescription today without complaint.  Will continue to monitor for progression. Check out 1200.   Dr. Kate Sable is Medical Director for [facility] Cardiac and Pulmonary Rehab.

## 2018-06-25 ENCOUNTER — Encounter (HOSPITAL_COMMUNITY)
Admission: RE | Admit: 2018-06-25 | Discharge: 2018-06-25 | Disposition: A | Discharge status: Home / Self Care | Payer: Medicaid Other | Source: Ambulatory Visit | Attending: Internal Medicine | Admitting: Internal Medicine

## 2018-06-25 DIAGNOSIS — I428 Other cardiomyopathies: Secondary | ICD-10-CM | POA: Diagnosis not present

## 2018-06-25 NOTE — Progress Notes (Signed)
Daily Session Note  Patient Details  Name: Michael Vaughn MRN: [id number] Date of Birth: [birth year] Referring Provider:     CARDIAC REHAB PHASE II ORIENTATION from 04/29/2018 in [place]  Referring Provider  [name]      Encounter Date: 06/25/2018  Check In: Session Check In - 06/25/18 1545      Check-In   Supervising physician immediately available to respond to emergencies  See telemetry face sheet for immediately available MD    Location  [facility]    Staff Present  Benay Pike, Exercise Physiologist;Genesee Nase Wynetta Emery, RN, BSN    Medication changes reported      No    Fall or balance concerns reported     Yes    Comments  Due to his back surgery his (R) leg gets weak and numb.     Warm-up and Cool-down  Performed as group-led Higher education careers adviser Performed  Yes    VAD Patient?  No    PAD/SET Patient?  No      Pain Assessment   Currently in Pain?  No/denies    Pain Score  0-No pain    Multiple Pain Sites  No       Capillary Blood Glucose: No results found for this or any previous visit (from the past 24 hour(s)).    Social History   Tobacco Use  Smoking Status Former Smoker  . Packs/day: 1.00  . Years: 5.00  . Pack years: 5.00  . Types: Cigarettes  . Last attempt to quit: 09/22/1989  . Years since quitting: 28.7  Smokeless Tobacco Former Systems developer  . Types: Snuff  . Quit date: 09/22/1989  Tobacco Comment   dipped snuff for one year    Goals Met:  Independence with exercise equipment Exercise tolerated well No report of cardiac concerns or symptoms Strength training completed today  Goals Unmet:  Not Applicable  Comments: Pt able to follow exercise prescription today without complaint.  Will continue to monitor for progression. Check out 1645.   Dr. Kate Sable is Medical Director for [facility] Cardiac and Pulmonary Rehab.

## 2018-06-28 ENCOUNTER — Encounter (HOSPITAL_COMMUNITY)
Admission: RE | Admit: 2018-06-28 | Discharge: 2018-06-28 | Disposition: A | Discharge status: Home / Self Care | Payer: Medicaid Other | Source: Ambulatory Visit | Attending: Internal Medicine | Admitting: Internal Medicine

## 2018-06-28 DIAGNOSIS — I428 Other cardiomyopathies: Secondary | ICD-10-CM | POA: Diagnosis not present

## 2018-06-28 LAB — GLUCOSE, CAPILLARY: GLUCOSE-CAPILLARY: 174 mg/dL — AB (ref 70–99)

## 2018-06-28 NOTE — Progress Notes (Signed)
Cardiac Individual Treatment Plan  Patient Details  Name: Michael Vaughn MRN: [id number] Date of Birth: 01/29/1961 Referring Provider:     CARDIAC REHAB PHASE II ORIENTATION from 04/29/2018 in [facility]  Referring Provider  Dr. Beaty      Initial Encounter Date:    CARDIAC REHAB PHASE II ORIENTATION from 04/29/2018 in [facility]  Date  04/29/18      Visit Diagnosis: Cardiomyopathy, nonischemic (HCC)  Patient's Home Medications on Admission:  Current Outpatient Medications:  .  allopurinol (ZYLOPRIM) 300 MG tablet, Take 300 mg by mouth daily., Disp: , Rfl:  .  amiodarone (PACERONE) 200 MG tablet, Take 200 mg daily by mouth., Disp: , Rfl:  .  aspirin 81 MG tablet, Take 81 mg by mouth daily. , Disp: , Rfl:  .  furosemide (LASIX) 80 MG tablet, Take 60 mg by mouth daily. , Disp: , Rfl:  .  gabapentin (NEURONTIN) 600 MG tablet, Take 600 mg by mouth 2 (two) times daily. , Disp: , Rfl:  .  glipiZIDE (GLUCOTROL XL) 10 MG 24 hr tablet, Take 10 mg by mouth daily.  , Disp: , Rfl:  .  HYDROcodone-acetaminophen (NORCO/VICODIN) 5-325 MG tablet, Take 1 tablet every 6 (six) hours as needed by mouth for moderate pain., Disp: , Rfl:  .  ibuprofen (ADVIL,MOTRIN) 400 MG tablet, Take 400 mg every 6 (six) hours as needed by mouth., Disp: , Rfl:  .  insulin glargine (LANTUS) 100 UNIT/ML injection, Inject 14 Units into the skin daily., Disp: , Rfl:  .  insulin glargine (LANTUS) 100 UNIT/ML injection, Inject 10 Units into the skin at bedtime., Disp: , Rfl:  .  lisinopril (PRINIVIL,ZESTRIL) 40 MG tablet, Take 40 mg daily by mouth., Disp: , Rfl:  .  metFORMIN (GLUCOPHAGE) 1000 MG tablet, Take 1,000 mg by mouth 2 (two) times daily with a meal., Disp: , Rfl:  .  metoprolol succinate (TOPROL-XL) 25 MG 24 hr tablet, Take 37.5 mg daily by mouth., Disp: , Rfl:  .  Multiple Vitamin (MULTI-VITAMINS) TABS, Take 1 tablet daily by mouth., Disp: , Rfl:  .  omega-3 acid ethyl  esters (LOVAZA) 1 g capsule, Take 1 g daily by mouth., Disp: , Rfl:  .  omeprazole (PRILOSEC) 20 MG capsule, Take 20 mg by mouth daily., Disp: , Rfl:  .  pravastatin (PRAVACHOL) 40 MG tablet, Take 40 mg by mouth daily.  , Disp: , Rfl:  .  ranitidine (ZANTAC) 300 MG tablet, Take 300 mg by mouth 2 (two) times daily., Disp: , Rfl:  .  senna-docusate (SENOKOT-S) 8.6-50 MG tablet, Take 2 tablets at bedtime by mouth., Disp: , Rfl:  .  spironolactone (ALDACTONE) 25 MG tablet, Take 12.5 mg daily by mouth., Disp: , Rfl:  .  tadalafil (CIALIS) 20 MG tablet, Take 1 tablet daily as needed by mouth., Disp: , Rfl: 11 .  tamsulosin (FLOMAX) 0.4 MG CAPS capsule, Take 0.4 mg by mouth daily., Disp: , Rfl:   Past Medical History: Past Medical History:  Diagnosis Date  . Anginal pain (HCC)   . 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 BPH (benign prostatic hyperplasia)   . Bradycardia    July, 2012, related to medication  . CAD (coronary artery disease)    Some coronary irregularities by catheterization 2006 /  nuclear, July, 20 12  ,  question of some ischemia in the lateral wall although technically quite difficult.  . Diabetes mellitus   .   Dyslipidemia   . Dysrhythmia   . Ejection fraction    Improved from the past  /  ejection fraction 50%, echo, July, 2012, hypokinesis at the base of the inferior wall.  [place] Kitchen GERD (gastroesophageal reflux disease)   . Headache   . History of hiatal hernia   . Hypertension   . Hypertension   . Lumbar disc disease   . Morbid obesity ([place])   . Myocardial infarction ([place])   . 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 OSA (obstructive sleep apnea)   . Shortness of breath    September, 2012    Tobacco Use: Social History   Tobacco Use  Smoking Status Former Smoker  . Packs/day: 1.00   . Years: 5.00  . Pack years: 5.00  . Types: Cigarettes  . Last attempt to quit: 09/22/1989  . Years since quitting: 28.7  Smokeless Tobacco Former Systems developer  . Types: Snuff  . Quit date: 09/22/1989  Tobacco Comment   dipped snuff for one year    Labs: Recent Review Flowsheet Data    Labs for ITP Cardiac and Pulmonary Rehab 01/14/2016 01/22/2016 08/04/2017 12/19/2017   Hemoglobin A1c 8.2(H) 7.6(H) 8.6(H) 9.0%      Capillary Blood Glucose: Lab Results  Component Value Date   GLUCAP 174 (H) 06/28/2018   GLUCAP 136 (H) 06/18/2018   GLUCAP 194 (H) 08/13/2017   GLUCAP 203 (H) 08/13/2017   GLUCAP 184 (H) 08/05/2017     Exercise Target Goals: Exercise Program Goal: Individual exercise prescription set using results from initial 6 min walk test and THRR while considering  patient's activity barriers and safety.   Exercise Prescription Goal: Starting with aerobic activity 30 plus minutes a day, 3 days per week for initial exercise prescription. Provide home exercise prescription and guidelines that participant acknowledges understanding prior to discharge.  Activity Barriers & Risk Stratification: Activity Barriers & Cardiac Risk Stratification - 04/30/18 1706      Activity Barriers & Cardiac Risk Stratification   Activity Barriers  Back Problems;Assistive Device;Balance Concerns;Deconditioning   (R) leg weakness   Cardiac Risk Stratification  High       6 Minute Walk: 6 Minute Walk    Row Name 04/29/18 1702         6 Minute Walk   Phase  Initial     Distance  1150 feet     Walk Time  6 minutes     # of Rest Breaks  0     MPH  2.17     METS  2.66     RPE  11     Perceived Dyspnea   9     VO2 Peak  8.53     Symptoms  No     Resting HR  64 bpm     Resting BP  150/80     Resting Oxygen Saturation   98 %     Exercise Oxygen Saturation  during 6 min walk  98 %     Max Ex. HR  78 bpm     Max Ex. BP  174/92     2 Minute Post BP  168/88        Oxygen Initial  Assessment:   Oxygen Re-Evaluation:   Oxygen Discharge (Final Oxygen Re-Evaluation):   Initial Exercise Prescription: Initial Exercise Prescription - 04/30/18 1700  Date of Initial Exercise RX and Referring Provider   Date  04/29/18    Referring Provider  [name]    Expected Discharge Date  08/09/18      Treadmill   MPH  1    Grade  0    Minutes  17    METs  1.7      Arm Ergometer   Level  1    Watts  11    RPM  52    Minutes  17    METs  1.8      T5 Nustep   Level  1    SPM  54    Minutes  17    METs  1.7      Prescription Details   Frequency (times per week)  3    Duration  Progress to 30 minutes of continuous aerobic without signs/symptoms of physical distress      Intensity   THRR 40-80% of Max Heartrate  [id number]    Ratings of Perceived Exertion  11-13    Perceived Dyspnea  0-4      Progression   Progression  Continue progressive overload as per policy without signs/symptoms or physical distress.      Resistance Training   Training Prescription  Yes    Weight  1    Reps  10-15       Perform Capillary Blood Glucose checks as needed.  Exercise Prescription Changes:  Exercise Prescription Changes    Row Name 05/13/18 0700 05/27/18 1000 06/21/18 1400         Response to Exercise   Blood Pressure (Admit)  126/60  138/74  146/80     Blood Pressure (Exercise)  130/70  150/74  154/80     Blood Pressure (Exit)  144/70  140/72  142/78     Heart Rate (Admit)  52 bpm  55 bpm  61 bpm     Heart Rate (Exercise)  63 bpm  73 bpm  77 bpm     Heart Rate (Exit)  61 bpm  64 bpm  70 bpm     Rating of Perceived Exertion (Exercise)  '11  11  11     '$ Comments  -  -  increased overall MET level      Duration  Progress to 30 minutes of  aerobic without signs/symptoms of physical distress  Progress to 30 minutes of  aerobic without signs/symptoms of physical distress  Progress to 30 minutes of  aerobic without signs/symptoms of physical distress     Intensity   THRR New [phone]  THRR unchanged  THRR unchanged       Progression   Progression  Continue to progress workloads to maintain intensity without signs/symptoms of physical distress.  Continue to progress workloads to maintain intensity without signs/symptoms of physical distress.  Continue to progress workloads to maintain intensity without signs/symptoms of physical distress.     Average METs  1.95  2.05  2.15       Resistance Training   Training Prescription  Yes  Yes  Yes     Weight  '1  3  3     '$ Reps  10-15  10-15  10-15       Treadmill   MPH  1.3  1.3  1.4     Grade  0  0  0     Minutes  '17  17  17     '$ METs  1.9  1.9  2       T5 Nustep   Level  '1  1  2     '$ SPM  93  112  120     Minutes  '22  22  22     '$ METs  2  2.2  2.3       Home Exercise Plan   Plans to continue exercise at  Home (comment)  Home (comment)  Home (comment)     Frequency  Add 2 additional days to program exercise sessions.  Add 2 additional days to program exercise sessions.  Add 2 additional days to program exercise sessions.     Initial Home Exercises Provided  05/05/18  05/05/18  05/05/18        Exercise Comments:  Exercise Comments    Row Name 05/17/18 1507 05/27/18 1036 06/07/18 0732 06/28/18 1419     Exercise Comments  Patient is progressing nicely. He is only had 6 visits. His has gained 7.6lbs since his last visit on 05/14/18. Instructed patient to get a scale and weight and keep a record. He has been able to handle the increases in hand held weights and equipment w/o any abnormal S/S.   Patient is progressing nicely. He has had 9 visits. His has lost 5lbs since his last visit on 05/21/18. Instructed patient to get a scale and weight and keep a record. He has been able to handle the increases in hand held weights and equipment w/o any abnormal S/S.   Patient is doing well. He is on his 13 visit. He has not lost any wieght since diuretic, he is holding steady at current weight.   Patient is still  maintaining his weight despite being put on a diuretic. He works hard everyday and has increased his workloads on the equipment as tolerated.        Exercise Goals and Review:  Exercise Goals    Row Name 04/30/18 1715             Exercise Goals   Increase Physical Activity  Yes       Intervention  Develop an individualized exercise prescription for aerobic and resistive training based on initial evaluation findings, risk stratification, comorbidities and participant's personal goals.;Provide advice, education, support and counseling about physical activity/exercise needs.       Expected Outcomes  Short Term: Attend rehab on a regular basis to increase amount of physical activity.;Long Term: Exercising regularly at least 3-5 days a week.       Increase Strength and Stamina  Yes       Intervention  Provide advice, education, support and counseling about physical activity/exercise needs.;Develop an individualized exercise prescription for aerobic and resistive training based on initial evaluation findings, risk stratification, comorbidities and participant's personal goals.       Expected Outcomes  Short Term: Perform resistance training exercises routinely during rehab and add in resistance training at home;Short Term: Increase workloads from initial exercise prescription for resistance, speed, and METs.;Long Term: Improve cardiorespiratory fitness, muscular endurance and strength as measured by increased METs and functional capacity (6MWT)       Able to understand and use rate of perceived exertion (RPE) scale  Yes       Intervention  Provide education and explanation on how to use RPE scale       Expected Outcomes  Short Term: Able to use RPE daily in rehab to express subjective intensity level;Long Term:  Able to use RPE to guide intensity level when  exercising independently       Knowledge and understanding of Target Heart Rate Range (THRR)  Yes       Intervention  Provide education and  explanation of THRR including how the numbers were predicted and where they are located for reference       Expected Outcomes  Short Term: Able to state/look up THRR;Long Term: Able to use THRR to govern intensity when exercising independently       Able to check pulse independently  Yes       Intervention  Provide education and demonstration on how to check pulse in carotid and radial arteries.;Review the importance of being able to check your own pulse for safety during independent exercise       Expected Outcomes  Short Term: Able to explain why pulse checking is important during independent exercise;Long Term: Able to check pulse independently and accurately       Understanding of Exercise Prescription  Yes       Intervention  Provide education, explanation, and written materials on patient's individual exercise prescription       Expected Outcomes  Short Term: Able to explain program exercise prescription;Long Term: Able to explain home exercise prescription to exercise independently          Exercise Goals Re-Evaluation : Exercise Goals Re-Evaluation    Row Name 05/17/18 1503 05/27/18 1032 06/07/18 0724 06/28/18 1416       Exercise Goal Re-Evaluation   Exercise Goals Review  Increase Physical Activity;Increase Strength and Stamina;Able to check pulse independently;Knowledge and understanding of Target Heart Rate Range (THRR)  Increase Physical Activity;Increase Strength and Stamina;Able to check pulse independently;Knowledge and understanding of Target Heart Rate Range (THRR)  Increase Physical Activity;Increase Strength and Stamina;Knowledge and understanding of Target Heart Rate Range (THRR);Able to check pulse independently;Understanding of Exercise Prescription;Able to understand and use rate of perceived exertion (RPE) scale  Increase Physical Activity;Increase Strength and Stamina;Knowledge and understanding of Target Heart Rate Range (THRR);Able to check pulse  independently;Understanding of Exercise Prescription;Able to understand and use rate of perceived exertion (RPE) scale    Comments  -  Patient is progressing well in the program and working hard to improve activity. He has increased his overall MET level as well as his dumbbell weight. He was put on a diuretic to aid in losing weight and decrease the amount of fluid.   Patient is progressing well in the program and working hard to improve activity. He has increased his overall MET level as well as his dumbbell weight. He was put on a diuretic to aid in losing weight and decrease the amount of fluid. His weight is holding steady at the same place since given the diuretic.   Patient has continued to work hard in th eprogram and increase his workloads and weights as he is getting stronger. He is maintaining his weight around 283 lbs, although he is still trying to lose weight. He stated tha the exercise has helped him to feel better and able to do more with his grandkids.     Expected Outcomes  Improve activity and lose 40lbs  Improve activity and lose 40lbs  Improve activity and lose 40lbs  Improve overall activity level and lose 40lbs.         Discharge Exercise Prescription (Final Exercise Prescription Changes): Exercise Prescription Changes - 06/21/18 1400      Response to Exercise   Blood Pressure (Admit)  146/80    Blood Pressure (Exercise)  154/80  Blood Pressure (Exit)  142/78    Heart Rate (Admit)  61 bpm    Heart Rate (Exercise)  77 bpm    Heart Rate (Exit)  70 bpm    Rating of Perceived Exertion (Exercise)  11    Comments  increased overall MET level     Duration  Progress to 30 minutes of  aerobic without signs/symptoms of physical distress    Intensity  THRR unchanged      Progression   Progression  Continue to progress workloads to maintain intensity without signs/symptoms of physical distress.    Average METs  2.15      Resistance Training   Training Prescription  Yes     Weight  3    Reps  10-15      Treadmill   MPH  1.4    Grade  0    Minutes  17    METs  2      T5 Nustep   Level  2    SPM  120    Minutes  22    METs  2.3      Home Exercise Plan   Plans to continue exercise at  Home (comment)    Frequency  Add 2 additional days to program exercise sessions.    Initial Home Exercises Provided  05/05/18       Nutrition:  Target Goals: Understanding of nutrition guidelines, daily intake of sodium '1500mg'$ , cholesterol '200mg'$ , calories 30% from fat and 7% or less from saturated fats, daily to have 5 or more servings of fruits and vegetables.  Biometrics: Pre Biometrics - 04/30/18 1718      Pre Biometrics   Height  '5\' 11"'$  (1.803 m)    Weight  128.4 kg    Waist Circumference  53 inches    Hip Circumference  43 inches    Waist to Hip Ratio  1.23 %    BMI (Calculated)  39.49    Triceps Skinfold  20 mm    % Body Fat  38.4 %    Grip Strength  29.8 kg   kilograms   Flexibility  10.2 in    Single Leg Stand  38 seconds        Nutrition Therapy Plan and Nutrition Goals: Nutrition Therapy & Goals - 05/27/18 1259      Personal Nutrition Goals   Nutrition Goal  For heart healthy choices add >50% of whole grains, make half their plate fruits and vegetables. Discuss the difference between starchy vegetables and leafy greens, and how leafy vegetables provide fiber, helps maintain healthy weight, helps control blood glucose, and lowers cholesterol.  Discuss purchasing fresh or frozen vegetable to reduce sodium and not to add grease, fat or sugar. Consume <18oz of red meat per week. Consume lean cuts of meats and very little of meats high in sodium and nitrates such as pork and lunch meats. Discussed portion control for all food groups.    Additional Goals?  No      Intervention Plan   Intervention  Nutrition handout(s) given to patient.    Expected Outcomes  Short Term Goal: Understand basic principles of dietary content, such as calories, fat,  sodium, cholesterol and nutrients.       Nutrition Assessments: Nutrition Assessments - 04/30/18 1724      MEDFICTS Scores   Pre Score  24       Nutrition Goals Re-Evaluation: Nutrition Goals Re-Evaluation    [place] Name 06/07/18 1322 06/28/18 1448  Goals   Current Weight  288 lb (130.6 kg)  285 lb 12.8 oz (129.6 kg)      Nutrition Goal  For heart healthy choices add >50% of whole grains, make half their plate fruits and vegetables. Discuss the difference between starchy vegetables and leafy greens, and how leafy vegetables provide fiber, helps maintain healthy weight, helps control blood glucose, and lowers cholesterol.  Discuss purchasing fresh or frozen vegetable to reduce sodium and not to add grease, fat or sugar. Consume <18oz of red meat per week. Consume lean cuts of meats and very little of meats high in sodium and nitrates such as pork and lunch meats. Discussed portion control for all food groups.  For heart healthy choices add >50% of whole grains, make half their plate fruits and vegetables. Discuss the difference between starchy vegetables and leafy greens, and how leafy vegetables provide fiber, helps maintain healthy weight, helps control blood glucose, and lowers cholesterol.  Discuss purchasing fresh or frozen vegetable to reduce sodium and not to add grease, fat or sugar. Consume <18oz of red meat per week. Consume lean cuts of meats and very little of meats high in sodium and nitrates such as pork and lunch meats. Discussed portion control for all food groups.      Comment  Patient has gained 5 lbs since last 30 day review. His weight does fluctuate. He does increase his Lasix dosage per his MD when he gaines. He continues to say he is eating heart healthy and also following a diabetic diet.   Patient has lost 3 lbs since last 30 day review. His weight continues to fluctuate. He continues to increaase his Lasix dosage per his MD when he gains. He says he continue to  try and follow a heart healthy diabetic diet.       Expected Outcome  Patient will continue to follow a heart healthy diabetic diet.   Patient will continue to follow a heart healthy diabetic diet.         Personal Goal #2 Re-Evaluation   Personal Goal #2  -  Patient says he continues to eat a low fat, low sodium diabetic diet.          Nutrition Goals Discharge (Final Nutrition Goals Re-Evaluation): Nutrition Goals Re-Evaluation - 06/28/18 1448      Goals   Current Weight  285 lb 12.8 oz (129.6 kg)    Nutrition Goal  For heart healthy choices add >50% of whole grains, make half their plate fruits and vegetables. Discuss the difference between starchy vegetables and leafy greens, and how leafy vegetables provide fiber, helps maintain healthy weight, helps control blood glucose, and lowers cholesterol.  Discuss purchasing fresh or frozen vegetable to reduce sodium and not to add grease, fat or sugar. Consume <18oz of red meat per week. Consume lean cuts of meats and very little of meats high in sodium and nitrates such as pork and lunch meats. Discussed portion control for all food groups.    Comment  Patient has lost 3 lbs since last 30 day review. His weight continues to fluctuate. He continues to increaase his Lasix dosage per his MD when he gains. He says he continue to try and follow a heart healthy diabetic diet.     Expected Outcome  Patient will continue to follow a heart healthy diabetic diet.       Personal Goal #2 Re-Evaluation   Personal Goal #2  Patient says he continues to eat a low fat,  low sodium diabetic diet.        Psychosocial: Target Goals: Acknowledge presence or absence of significant depression and/or stress, maximize coping skills, provide positive support system. Participant is able to verbalize types and ability to use techniques and skills needed for reducing stress and depression.  Initial Review & Psychosocial Screening: Initial Psych Review & Screening -  04/30/18 1721      Initial Review   Current issues with  None Identified      Family Dynamics   Good Support System?  Yes      Barriers   Psychosocial barriers to participate in program  There are no identifiable barriers or psychosocial needs.      Screening Interventions   Interventions  Encouraged to exercise    Expected Outcomes  Short Term goal: Identification and review with participant of any Quality of Life or Depression concerns found by scoring the questionnaire.;Long Term goal: The participant improves quality of Life and PHQ9 Scores as seen by post scores and/or verbalization of changes       Quality of Life Scores: Quality of Life - 04/30/18 1722      Quality of Life   Select  Quality of Life      Quality of Life Scores   Health/Function Pre  19.29 %    Socioeconomic Pre  18 %    Psych/Spiritual Pre  24.86 %    Family Pre  24 %    GLOBAL Pre  20.91 %      Scores of 19 and below usually indicate a poorer quality of life in these areas.  A difference of  2-3 points is a clinically meaningful difference.  A difference of 2-3 points in the total score of the Quality of Life Index has been associated with significant improvement in overall quality of life, self-image, physical symptoms, and general health in studies assessing change in quality of life.  PHQ-9: Recent Review Flowsheet Data    There is no flowsheet data to display.     Interpretation of Total Score  Total Score Depression Severity:  1-4 = Minimal depression, 5-9 = Mild depression, 10-14 = Moderate depression, 15-19 = Moderately severe depression, 20-27 = Severe depression   Psychosocial Evaluation and Intervention: Psychosocial Evaluation - 04/30/18 1722      Psychosocial Evaluation & Interventions   Interventions  Encouraged to exercise with the program and follow exercise prescription    Continue Psychosocial Services   No Follow up required       Psychosocial Re-Evaluation: Psychosocial  Re-Evaluation    [place] Name 05/17/18 1414 06/07/18 1343 06/28/18 1448         Psychosocial Re-Evaluation   Current issues with  None Identified  None Identified  None Identified     Comments  Patient's initial QOL score was 20.91 and his PHQ-9 score was 3 with no psychosocial issues identified at orientation.  Patient's initial QOL score was 20.91 and his PHQ-9 score was 3 and continues to have no psychosocial issues identified.  Patient's initial QOL score was 20.91 and his PHQ-9 score was 3 and continues to have no psychosocial issues identified.     Expected Outcomes  Patient will not have any psychosocial issues identified at [place].   Patient will not have any psychosocial issues identified at [place].   Patient will not have any psychosocial issues identified at [place].      Interventions  Stress management education;Relaxation education;Encouraged to attend Cardiac Rehabilitation for the exercise  Stress  management education;Relaxation education;Encouraged to attend Cardiac Rehabilitation for the exercise  Stress management education;Relaxation education;Encouraged to attend Cardiac Rehabilitation for the exercise     Continue Psychosocial Services   No Follow up required  No Follow up required  No Follow up required        Psychosocial Discharge (Final Psychosocial Re-Evaluation): Psychosocial Re-Evaluation - 06/28/18 1448      Psychosocial Re-Evaluation   Current issues with  None Identified    Comments  Patient's initial QOL score was 20.91 and his PHQ-9 score was 3 and continues to have no psychosocial issues identified.    Expected Outcomes  Patient will not have any psychosocial issues identified at [place].     Interventions  Stress management education;Relaxation education;Encouraged to attend Cardiac Rehabilitation for the exercise    Continue Psychosocial Services   No Follow up required       Vocational Rehabilitation: Provide vocational rehab assistance to  qualifying candidates.   Vocational Rehab Evaluation & Intervention: Vocational Rehab - 04/30/18 1725      Initial Vocational Rehab Evaluation & Intervention   Assessment shows need for Vocational Rehabilitation  No   Disabled      Education: Education Goals: Education classes will be provided on a weekly basis, covering required topics. Participant will state understanding/return demonstration of topics presented.  Learning Barriers/Preferences: Learning Barriers/Preferences - 04/30/18 1724      Learning Barriers/Preferences   Learning Barriers  None    Learning Preferences  Individual Instruction;Group Instruction       Education Topics: Hypertension, Hypertension Reduction -Define heart disease and high blood pressure. Discus how high blood pressure affects the body and ways to reduce high blood pressure.   Exercise and Your Heart -Discuss why it is important to exercise, the FITT principles of exercise, normal and abnormal responses to exercise, and how to exercise safely.   CARDIAC REHAB PHASE II EXERCISE from 06/23/2018 in [place]  Date  05/05/18  Educator  D. Coad  Instruction Review Code  2- Demonstrated Understanding      Angina -Discuss definition of angina, causes of angina, treatment of angina, and how to decrease risk of having angina.   Cardiac Medications -Review what the following cardiac medications are used for, how they affect the body, and side effects that may occur when taking the medications.  Medications include Aspirin, Beta blockers, calcium channel blockers, ACE Inhibitors, angiotensin receptor blockers, diuretics, digoxin, and antihyperlipidemics.   CARDIAC REHAB PHASE II EXERCISE from 06/23/2018 in [place]  Date  05/19/18  Educator  Etheleen Mayhew  Instruction Review Code  2- Demonstrated Understanding      Congestive Heart Failure -Discuss the definition of CHF, how to live with CHF, the  signs and symptoms of CHF, and how keep track of weight and sodium intake.   CARDIAC REHAB PHASE II EXERCISE from 06/23/2018 in [place]  Date  05/26/18  Educator  D. Coad   Instruction Review Code  2- Demonstrated Understanding      Heart Disease and Intimacy -Discus the effect sexual activity has on the heart, how changes occur during intimacy as we age, and safety during sexual activity.   CARDIAC REHAB PHASE II EXERCISE from 06/23/2018 in [place]  Date  06/02/18  Educator  Etheleen Mayhew  Instruction Review Code  2- Demonstrated Understanding      Smoking Cessation / COPD -Discuss different methods to quit smoking, the health benefits of quitting smoking,  and the definition of COPD.   CARDIAC REHAB PHASE II EXERCISE from 06/23/2018 in [place]  Date  06/09/18  Educator  Etheleen Mayhew  Instruction Review Code  2- Demonstrated Understanding      Nutrition I: Fats -Discuss the types of cholesterol, what cholesterol does to the heart, and how cholesterol levels can be controlled.   CARDIAC REHAB PHASE II EXERCISE from 06/23/2018 in [place]  Date  06/16/18  Educator  D. Coad  Instruction Review Code  2- Demonstrated Understanding      Nutrition II: Labels -Discuss the different components of food labels and how to read food label   [place] from 06/23/2018 in [place]  Date  06/23/18  Educator  Etheleen Mayhew  Instruction Review Code  2- Demonstrated Understanding      Heart Parts/Heart Disease and PAD -Discuss the anatomy of the heart, the pathway of blood circulation through the heart, and these are affected by heart disease.   Stress I: Signs and Symptoms -Discuss the causes of stress, how stress may lead to anxiety and depression, and ways to limit stress.   Stress II: Relaxation -Discuss different types of relaxation techniques  to limit stress.   Warning Signs of Stroke / TIA -Discuss definition of a stroke, what the signs and symptoms are of a stroke, and how to identify when someone is having stroke.   Knowledge Questionnaire Score: Knowledge Questionnaire Score - 04/30/18 1725      Knowledge Questionnaire Score   Pre Score  21/24       Core Components/Risk Factors/Patient Goals at Admission: Personal Goals and Risk Factors at Admission - 04/30/18 1725      Core Components/Risk Factors/Patient Goals on Admission    Weight Management  Yes    Intervention  Weight Management/Obesity: Establish reasonable short term and long term weight goals.    Admit Weight  283 lb (128.4 kg)    Goal Weight: Short Term  273 lb (123.8 kg)    Goal Weight: Long Term  263 lb (119.3 kg)    Expected Outcomes  Short Term: Continue to assess and modify interventions until short term weight is achieved;Long Term: Adherence to nutrition and physical activity/exercise program aimed toward attainment of established weight goal    Personal Goal Other  Yes    Personal Goal  Improve activity, Lose 40 lbs overall. Will work at losing 15-20lbs while in program.     Intervention  Attend program 3 x week and supplement exercise at home 2 x week.     Expected Outcomes  Reach personal goals.        Core Components/Risk Factors/Patient Goals Review:  Goals and Risk Factor Review    Row Name 05/17/18 1408 06/07/18 1325 06/28/18 1439         Core Components/Risk Factors/Patient Goals Review   Personal Goals Review  Weight Management/Obesity;Diabetes;Hypertension Improve activity; lose weigth 40 lbs long term.  Weight Management/Obesity;Diabetes;Hypertension Improve activity; lose weight 40 lbs.   Weight Management/Obesity;Diabetes;Hypertension Improve activity; lose weight 40 lbs.      Review  Patient has completed 6 sessions gaining 7 lbs since his orientation visit. He says he feels better after only 6 sessions with increased strength. He  says he recently had an A1C which was 6.7 down from 7.5 on his previous one. His blood pressure remains hypertensive. Will continue to monitor for progress.   Patient has completed 14 sessions gaining 5  lbs since last 30 day review. His weight does fluctuate due to fluid. He has been told by MD to increase his Lasix dosage for 3 days if he gains over 5 lbs in one week. No recent A1C. His blood pressure does continue to fluctuate based on his fluid status. He continues to do well in the program with progression. He continues to say he is feeling stronger with increased energy. He says he is doing more at home now without difficulty. He is very pleased with his progress in the program and says he feels better overall. Will continue to monitor for progress.    Patient has completed 22 sessions losing 3 lbs since last 30 day review. His weight continues to fluctuate. He continues to increase his Lasix dosage for 3 days if he gains over 5 lbs in one week. His last A1C on file was 07/2017 at 8.6. His reported fasting glucose readings are usually greater than 150. He continues to do well in the program with progression. He continues to say the program is helping him get stronger with more energy. He says he is able to do more around the house and keep up with his grandkids. He recently complained of feeling some chest tightness and nausea during exericise. He says he feels this at times and it will last a few minutes. He plans to call and report this to his cardiolgoist. Will continue to monitor for progress.      Expected Outcomes  Patient will continue to attend sessions and complete the program meeting his personal goals.   Patient will continue to attend sessions and complete the program meeting his personal goals.   Patient will continue to attend sessions and complete the program meeting his personal goals.         Core Components/Risk Factors/Patient Goals at Discharge (Final Review):  Goals and Risk Factor  Review - 06/28/18 1439      Core Components/Risk Factors/Patient Goals Review   Personal Goals Review  Weight Management/Obesity;Diabetes;Hypertension   Improve activity; lose weight 40 lbs.    Review  Patient has completed 22 sessions losing 3 lbs since last 30 day review. His weight continues to fluctuate. He continues to increase his Lasix dosage for 3 days if he gains over 5 lbs in one week. His last A1C on file was 07/2017 at 8.6. His reported fasting glucose readings are usually greater than 150. He continues to do well in the program with progression. He continues to say the program is helping him get stronger with more energy. He says he is able to do more around the house and keep up with his grandkids. He recently complained of feeling some chest tightness and nausea during exericise. He says he feels this at times and it will last a few minutes. He plans to call and report this to his cardiolgoist. Will continue to monitor for progress.     Expected Outcomes  Patient will continue to attend sessions and complete the program meeting his personal goals.        ITP Comments: ITP Comments    Row Name 04/30/18 1519 05/03/18 0734 05/27/18 1259 06/18/18 1130 06/28/18 1450   ITP Comments  Patient was sent from [facility]. 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 He is eager to get started.   Patient new to program. Plans to start Wednesday 05/05/18.  Patient attended family life matter class with hospital chaplian to discuss how this recent event has impacted his life.  Patient complained of feeling "sluggish" today while on the treadmill. He says he started feeling this way on his drive over and felt like his blood sugar may be low. We check it at 136 mg/dl. He wanted to finish the session. He checked out without complaints.   Patient complained of feeling nausea with some chest tightness today on his second 17 minute sessions. His glucose was checked at 174 mg/dl. He says  he has this feeling frequently and has some NTG SL to take but did not have any with him. He refused to go to the ED but said he would call his cardiologist when he gets home to let them know he has had several episodes of chest tightness with nausea. Will continue to monitor.       Comments: ITP REVIEW Patient doing well in the program. Will continue to monitor for progress.

## 2018-06-28 NOTE — Progress Notes (Signed)
Daily Session Note  Patient Details  Name: Michael Vaughn MRN: [id number] Date of Birth: 1961/02/11 Referring Provider:     CARDIAC REHAB PHASE II ORIENTATION from 04/29/2018 in [place]  Referring Provider  [name]      Encounter Date: 06/28/2018  Check In: Session Check In - 06/28/18 1100      Check-In   Supervising physician immediately available to respond to emergencies  See telemetry face sheet for immediately available MD    Location  [facility]    Staff Present  Benay Pike, Exercise Physiologist;Debra Wynetta Emery, RN, BSN    Medication changes reported      No    Fall or balance concerns reported     Yes    Comments  Due to his back surgery his (R) leg gets weak and numb.     Tobacco Cessation  No Change    Warm-up and Cool-down  Performed as group-led instruction    Resistance Training Performed  Yes    VAD Patient?  No    PAD/SET Patient?  No      Pain Assessment   Currently in Pain?  No/denies    Pain Score  0-No pain    Multiple Pain Sites  No       Capillary Blood Glucose: No results found for this or any previous visit (from the past 24 hour(s)).    Social History   Tobacco Use  Smoking Status Former Smoker  . Packs/day: 1.00  . Years: 5.00  . Pack years: 5.00  . Types: Cigarettes  . Last attempt to quit: 09/22/1989  . Years since quitting: 28.7  Smokeless Tobacco Former Systems developer  . Types: Snuff  . Quit date: 09/22/1989  Tobacco Comment   dipped snuff for one year    Goals Met:  Independence with exercise equipment Exercise tolerated well No report of cardiac concerns or symptoms Strength training completed today  Goals Unmet:  Not Applicable  Comments: Pt able to follow exercise prescription today without complaint.  Will continue to monitor for progression. Check out 1200.   Dr. Kate Sable is Medical Director for [facility] Cardiac and Pulmonary Rehab.

## 2018-06-30 ENCOUNTER — Encounter (HOSPITAL_COMMUNITY)
Admission: RE | Admit: 2018-06-30 | Discharge: 2018-06-30 | Disposition: A | Discharge status: Home / Self Care | Payer: Medicaid Other | Source: Ambulatory Visit | Attending: Internal Medicine | Admitting: Internal Medicine

## 2018-06-30 DIAGNOSIS — I428 Other cardiomyopathies: Secondary | ICD-10-CM

## 2018-06-30 NOTE — Progress Notes (Signed)
Daily Session Note  Patient Details  Name: Michael Vaughn MRN: [id number] Date of Birth: 1960-12-24 Referring Provider:     CARDIAC REHAB PHASE II ORIENTATION from 04/29/2018 in [place]  Referring Provider  [name]      Encounter Date: 06/30/2018  Check In: Session Check In - 06/30/18 1100      Check-In   Supervising physician immediately available to respond to emergencies  See telemetry face sheet for immediately available MD    Location  [facility]    Staff Present  Benay Pike, Exercise Physiologist;Genaro Bekker Wynetta Emery, RN, BSN    Medication changes reported      No    Fall or balance concerns reported     Yes    Comments  Due to his back surgery his (R) leg gets weak and numb.     Resistance Training Performed  Yes    VAD Patient?  No    PAD/SET Patient?  No      Pain Assessment   Currently in Pain?  No/denies    Pain Score  0-No pain    Multiple Pain Sites  No       Capillary Blood Glucose: No results found for this or any previous visit (from the past 24 hour(s)).    Social History   Tobacco Use  Smoking Status Former Smoker  . Packs/day: 1.00  . Years: 5.00  . Pack years: 5.00  . Types: Cigarettes  . Last attempt to quit: 09/22/1989  . Years since quitting: 28.7  Smokeless Tobacco Former Systems developer  . Types: Snuff  . Quit date: 09/22/1989  Tobacco Comment   dipped snuff for one year    Goals Met:  Independence with exercise equipment Exercise tolerated well No report of cardiac concerns or symptoms Strength training completed today  Goals Unmet:  Not Applicable  Comments: Pt able to follow exercise prescription today without complaint.  Will continue to monitor for progression. Check out 1200.   Dr. Kate Sable is Medical Director for [facility] Cardiac and Pulmonary Rehab.

## 2018-07-02 ENCOUNTER — Encounter (HOSPITAL_COMMUNITY)
Admission: RE | Admit: 2018-07-02 | Discharge: 2018-07-02 | Disposition: A | Discharge status: Home / Self Care | Payer: Medicaid Other | Source: Ambulatory Visit | Attending: Internal Medicine | Admitting: Internal Medicine

## 2018-07-02 DIAGNOSIS — I428 Other cardiomyopathies: Secondary | ICD-10-CM

## 2018-07-02 NOTE — Progress Notes (Signed)
Daily Session Note  Patient Details  Name: Michael Vaughn MRN: [id number] Date of Birth: 02-21-1961 Referring Provider:     CARDIAC REHAB PHASE II ORIENTATION from 04/29/2018 in [place]  Referring Provider  [name]      Encounter Date: 07/02/2018  Check In: Session Check In - 07/02/18 1100      Check-In   Supervising physician immediately available to respond to emergencies  See telemetry face sheet for immediately available MD    Location  [facility]    Staff Present  Russella Dar, MS, EP, [facility], Exercise Physiologist;Dariusz Brase, Exercise Physiologist;Debra Wynetta Emery, RN, BSN    Medication changes reported      No    Fall or balance concerns reported     Yes    Comments  Due to his back surgery his (R) leg gets weak and numb.     Tobacco Cessation  No Change    Warm-up and Cool-down  Performed as group-led instruction    Resistance Training Performed  Yes    VAD Patient?  No    PAD/SET Patient?  No      Pain Assessment   Currently in Pain?  No/denies    Pain Score  0-No pain    Multiple Pain Sites  No       Capillary Blood Glucose: No results found for this or any previous visit (from the past 24 hour(s)).    Social History   Tobacco Use  Smoking Status Former Smoker  . Packs/day: 1.00  . Years: 5.00  . Pack years: 5.00  . Types: Cigarettes  . Last attempt to quit: 09/22/1989  . Years since quitting: 28.7  Smokeless Tobacco Former Systems developer  . Types: Snuff  . Quit date: 09/22/1989  Tobacco Comment   dipped snuff for one year    Goals Met:  Independence with exercise equipment Exercise tolerated well No report of cardiac concerns or symptoms Strength training completed today  Goals Unmet:  Not Applicable  Comments: Pt able to follow exercise prescription today without complaint.  Will continue to monitor for progression. Check out 12:00   Dr. Kate Sable is Medical Director for [place] and Pulmonary  Rehab.

## 2018-07-05 ENCOUNTER — Encounter (HOSPITAL_COMMUNITY)
Admission: RE | Admit: 2018-07-05 | Discharge: 2018-07-05 | Disposition: A | Discharge status: Home / Self Care | Payer: Medicaid Other | Source: Ambulatory Visit | Attending: Internal Medicine | Admitting: Internal Medicine

## 2018-07-05 DIAGNOSIS — I428 Other cardiomyopathies: Secondary | ICD-10-CM | POA: Diagnosis not present

## 2018-07-05 NOTE — Progress Notes (Signed)
Daily Session Note  Patient Details  Name: Michael Vaughn MRN: [id number] Date of Birth: 1961-03-03 Referring Provider:     CARDIAC REHAB PHASE II ORIENTATION from 04/29/2018 in [place]  Referring Provider  [name]      Encounter Date: 07/05/2018  Check In: Session Check In - 07/05/18 1100      Check-In   Supervising physician immediately available to respond to emergencies  See telemetry face sheet for immediately available MD    Location  [facility]    Staff Present  Russella Dar, MS, EP, [facility], Exercise Physiologist;Amanda Ballard, Exercise Physiologist;Odessie Polzin Wynetta Emery, RN, BSN    Medication changes reported      No    Fall or balance concerns reported     Yes    Comments  Due to his back surgery his (R) leg gets weak and numb.     Warm-up and Cool-down  Performed as group-led Higher education careers adviser Performed  Yes    VAD Patient?  No    PAD/SET Patient?  No      Pain Assessment   Currently in Pain?  No/denies    Pain Score  0-No pain    Multiple Pain Sites  No       Capillary Blood Glucose: No results found for this or any previous visit (from the past 24 hour(s)).    Social History   Tobacco Use  Smoking Status Former Smoker  . Packs/day: 1.00  . Years: 5.00  . Pack years: 5.00  . Types: Cigarettes  . Last attempt to quit: 09/22/1989  . Years since quitting: 28.8  Smokeless Tobacco Former Systems developer  . Types: Snuff  . Quit date: 09/22/1989  Tobacco Comment   dipped snuff for one year    Goals Met:  Independence with exercise equipment Exercise tolerated well No report of cardiac concerns or symptoms Strength training completed today  Goals Unmet:  Not Applicable  Comments: Pt able to follow exercise prescription today without complaint.  Will continue to monitor for progression. Check out 1200.   Dr. Kate Sable is Medical Director for [facility] Cardiac and Pulmonary Rehab.

## 2018-07-07 ENCOUNTER — Encounter (HOSPITAL_COMMUNITY)
Admission: RE | Admit: 2018-07-07 | Discharge: 2018-07-07 | Disposition: A | Discharge status: Home / Self Care | Payer: Medicaid Other | Source: Ambulatory Visit | Attending: Internal Medicine | Admitting: Internal Medicine

## 2018-07-07 DIAGNOSIS — I428 Other cardiomyopathies: Secondary | ICD-10-CM

## 2018-07-07 NOTE — Progress Notes (Signed)
Daily Session Note  Patient Details  Name: Michael Vaughn MRN: [id number] Date of Birth: July 28, 1961 Referring Provider:     CARDIAC REHAB PHASE II ORIENTATION from 04/29/2018 in [place]  Referring Provider  [name]      Encounter Date: 07/07/2018  Check In: Session Check In - 07/07/18 1100      Check-In   Supervising physician immediately available to respond to emergencies  See telemetry face sheet for immediately available MD    Location  [facility]    Staff Present  Russella Dar, MS, EP, [facility], Exercise Physiologist;Santoria Chason Zachery Conch, Exercise Physiologist    Medication changes reported      No    Fall or balance concerns reported     Yes    Comments  Due to his back surgery his (R) leg gets weak and numb.     Tobacco Cessation  No Change    Warm-up and Cool-down  Performed as group-led instruction    Resistance Training Performed  Yes    VAD Patient?  No    PAD/SET Patient?  No      Pain Assessment   Currently in Pain?  No/denies    Pain Score  0-No pain    Multiple Pain Sites  No       Capillary Blood Glucose: No results found for this or any previous visit (from the past 24 hour(s)).    Social History   Tobacco Use  Smoking Status Former Smoker  . Packs/day: 1.00  . Years: 5.00  . Pack years: 5.00  . Types: Cigarettes  . Last attempt to quit: 09/22/1989  . Years since quitting: 28.8  Smokeless Tobacco Former Systems developer  . Types: Snuff  . Quit date: 09/22/1989  Tobacco Comment   dipped snuff for one year    Goals Met:  Independence with exercise equipment Exercise tolerated well No report of cardiac concerns or symptoms Strength training completed today  Goals Unmet:  Not Applicable  Comments: Pt able to follow exercise prescription today without complaint.  Will continue to monitor for progression. Check out 12:00.   Dr. Kate Sable is Medical Director for [facility] Cardiac and Pulmonary Rehab.

## 2018-07-09 ENCOUNTER — Encounter (HOSPITAL_COMMUNITY): Payer: Medicaid Other

## 2018-07-12 ENCOUNTER — Encounter (HOSPITAL_COMMUNITY)
Admission: RE | Admit: 2018-07-12 | Discharge: 2018-07-12 | Disposition: A | Discharge status: Home / Self Care | Payer: Medicaid Other | Source: Ambulatory Visit | Attending: Internal Medicine | Admitting: Internal Medicine

## 2018-07-12 DIAGNOSIS — I428 Other cardiomyopathies: Secondary | ICD-10-CM

## 2018-07-12 NOTE — Progress Notes (Signed)
Daily Session Note  Patient Details  Name: EDGERRIN CORREIA MRN: [id number] Date of Birth: [birth year] Referring Provider:     CARDIAC REHAB PHASE II ORIENTATION from 04/29/2018 in [place]  Referring Provider  [name]      Encounter Date: 07/12/2018  Check In: Session Check In - 07/12/18 1100      Check-In   Supervising physician immediately available to respond to emergencies  See telemetry face sheet for immediately available MD    Location  [facility]    Staff Present  Russella Dar, MS, EP, [facility], Exercise Physiologist;Takera Rayl Zachery Conch, Exercise Physiologist    Medication changes reported      No    Fall or balance concerns reported     Yes    Comments  Due to his back surgery his (R) leg gets weak and numb.     Tobacco Cessation  No Change    Warm-up and Cool-down  Performed as group-led instruction    Resistance Training Performed  Yes    VAD Patient?  No    PAD/SET Patient?  No      Pain Assessment   Currently in Pain?  No/denies    Pain Score  0-No pain    Multiple Pain Sites  No       Capillary Blood Glucose: No results found for this or any previous visit (from the past 24 hour(s)).    Social History   Tobacco Use  Smoking Status Former Smoker  . Packs/day: 1.00  . Years: 5.00  . Pack years: 5.00  . Types: Cigarettes  . Last attempt to quit: 09/22/1989  . Years since quitting: 28.8  Smokeless Tobacco Former Systems developer  . Types: Snuff  . Quit date: 09/22/1989  Tobacco Comment   dipped snuff for one year    Goals Met:  Proper associated with RPD/PD & O2 Sat Independence with exercise equipment Exercise tolerated well No report of cardiac concerns or symptoms Strength training completed today  Goals Unmet:  Not Applicable  Comments: Pt able to follow exercise prescription today without complaint.  Will continue to monitor for progression. Check out 12:00.   Dr. Kate Sable is Medical Director for [facility]  Cardiac and Pulmonary Rehab.

## 2018-07-14 ENCOUNTER — Encounter (HOSPITAL_COMMUNITY)
Admission: RE | Admit: 2018-07-14 | Discharge: 2018-07-14 | Disposition: A | Discharge status: Home / Self Care | Payer: Medicaid Other | Source: Ambulatory Visit | Attending: Internal Medicine | Admitting: Internal Medicine

## 2018-07-14 DIAGNOSIS — I428 Other cardiomyopathies: Secondary | ICD-10-CM

## 2018-07-14 NOTE — Progress Notes (Signed)
Daily Session Note  Patient Details  Name: STORM SOVINE MRN: [id number] Date of Birth: 1960/11/29 Referring Provider:     CARDIAC REHAB PHASE II ORIENTATION from 04/29/2018 in [place]  Referring Provider  [name]      Encounter Date: 07/14/2018  Check In: Session Check In - 07/14/18 1100      Check-In   Supervising physician immediately available to respond to emergencies  See telemetry face sheet for immediately available MD    Location  [facility]    Staff Present  Russella Dar, MS, EP, [facility], Exercise Physiologist;Lyonel Morejon Zachery Conch, Exercise Physiologist    Medication changes reported      No    Fall or balance concerns reported     Yes    Comments  Due to his back surgery his (R) leg gets weak and numb.     Tobacco Cessation  No Change    Warm-up and Cool-down  Performed as group-led instruction    Resistance Training Performed  Yes    VAD Patient?  No    PAD/SET Patient?  No      Pain Assessment   Currently in Pain?  No/denies    Pain Score  0-No pain    Multiple Pain Sites  No       Capillary Blood Glucose: No results found for this or any previous visit (from the past 24 hour(s)).    Social History   Tobacco Use  Smoking Status Former Smoker  . Packs/day: 1.00  . Years: 5.00  . Pack years: 5.00  . Types: Cigarettes  . Last attempt to quit: 09/22/1989  . Years since quitting: 28.8  Smokeless Tobacco Former Systems developer  . Types: Snuff  . Quit date: 09/22/1989  Tobacco Comment   dipped snuff for one year    Goals Met:  Proper associated with RPD/PD & O2 Sat Independence with exercise equipment Exercise tolerated well No report of cardiac concerns or symptoms Strength training completed today  Goals Unmet:  Not Applicable  Comments: Pt able to follow exercise prescription today without complaint.  Will continue to monitor for progression. Check out 12:00.   Dr. Kate Sable is Medical Director for [facility]  Cardiac and Pulmonary Rehab.

## 2018-07-16 ENCOUNTER — Encounter (HOSPITAL_COMMUNITY)
Admission: RE | Admit: 2018-07-16 | Discharge: 2018-07-16 | Disposition: A | Discharge status: Home / Self Care | Payer: Medicaid Other | Source: Ambulatory Visit | Attending: Internal Medicine | Admitting: Internal Medicine

## 2018-07-16 DIAGNOSIS — I428 Other cardiomyopathies: Secondary | ICD-10-CM

## 2018-07-16 NOTE — Progress Notes (Signed)
Daily Session Note  Patient Details  Name: Michael Vaughn MRN: [id number] Date of Birth: 04-26-1961 Referring Provider:     CARDIAC REHAB PHASE II ORIENTATION from 04/29/2018 in [place]  Referring Provider  [name]      Encounter Date: 07/16/2018  Check In: Session Check In - 07/16/18 1100      Check-In   Supervising physician immediately available to respond to emergencies  See telemetry face sheet for immediately available MD    Location  [facility]    Staff Present  Russella Dar, MS, EP, [facility], Exercise Physiologist;Amanda Zachery Conch, Exercise Physiologist    Medication changes reported      No    Fall or balance concerns reported     Yes    Comments  Due to his back surgery his (R) leg gets weak and numb.     Warm-up and Cool-down  Performed as group-led instruction    Resistance Training Performed  No    VAD Patient?  No    PAD/SET Patient?  No      Pain Assessment   Currently in Pain?  No/denies    Pain Score  0-No pain    Multiple Pain Sites  No       Capillary Blood Glucose: No results found for this or any previous visit (from the past 24 hour(s)).    Social History   Tobacco Use  Smoking Status Former Smoker  . Packs/day: 1.00  . Years: 5.00  . Pack years: 5.00  . Types: Cigarettes  . Last attempt to quit: 09/22/1989  . Years since quitting: 28.8  Smokeless Tobacco Former Systems developer  . Types: Snuff  . Quit date: 09/22/1989  Tobacco Comment   dipped snuff for one year    Goals Met:  Independence with exercise equipment Exercise tolerated well No report of cardiac concerns or symptoms Strength training completed today  Goals Unmet:  Not Applicable  Comments: Pt able to follow exercise prescription today without complaint.  Will continue to monitor for progression. Check out 1200.   Dr. Kate Sable is Medical Director for [facility] Cardiac and Pulmonary Rehab.

## 2018-07-19 ENCOUNTER — Encounter (HOSPITAL_COMMUNITY)
Admission: RE | Admit: 2018-07-19 | Discharge: 2018-07-19 | Disposition: A | Discharge status: Home / Self Care | Payer: Medicaid Other | Source: Ambulatory Visit | Attending: Internal Medicine | Admitting: Internal Medicine

## 2018-07-19 DIAGNOSIS — I428 Other cardiomyopathies: Secondary | ICD-10-CM | POA: Diagnosis not present

## 2018-07-19 NOTE — Progress Notes (Signed)
Daily Session Note  Patient Details  Name: Michael Vaughn MRN: [id number] Date of Birth: Apr 22, 1961 Referring Provider:     CARDIAC REHAB PHASE II ORIENTATION from 04/29/2018 in [place]  Referring Provider  [name]      Encounter Date: 07/19/2018  Check In: Session Check In - 07/19/18 1100      Check-In   Supervising physician immediately available to respond to emergencies  See telemetry face sheet for immediately available MD    Location  [facility]    Staff Present  Russella Dar, MS, EP, [facility], Exercise Physiologist;Amanda Ballard, Exercise Physiologist;Migel Hannis Wynetta Emery, RN, BSN    Medication changes reported      No    Fall or balance concerns reported     Yes    Comments  Due to his back surgery his (R) leg gets weak and numb.     Warm-up and Cool-down  Performed as group-led Higher education careers adviser Performed  Yes    VAD Patient?  No    PAD/SET Patient?  No      Pain Assessment   Currently in Pain?  No/denies    Pain Score  0-No pain    Multiple Pain Sites  No       Capillary Blood Glucose: No results found for this or any previous visit (from the past 24 hour(s)).    Social History   Tobacco Use  Smoking Status Former Smoker  . Packs/day: 1.00  . Years: 5.00  . Pack years: 5.00  . Types: Cigarettes  . Last attempt to quit: 09/22/1989  . Years since quitting: 28.8  Smokeless Tobacco Former Systems developer  . Types: Snuff  . Quit date: 09/22/1989  Tobacco Comment   dipped snuff for one year    Goals Met:  Independence with exercise equipment Exercise tolerated well No report of cardiac concerns or symptoms Strength training completed today  Goals Unmet:  Not Applicable  Comments: Pt able to follow exercise prescription today without complaint.  Will continue to monitor for progression. Check out 1200.   Dr. Kate Sable is Medical Director for [facility] Cardiac and Pulmonary Rehab.

## 2018-07-21 ENCOUNTER — Encounter (HOSPITAL_COMMUNITY): Payer: Medicaid Other

## 2018-07-23 ENCOUNTER — Encounter (HOSPITAL_COMMUNITY): Payer: Medicaid Other

## 2018-07-26 ENCOUNTER — Encounter (HOSPITAL_COMMUNITY)
Admission: RE | Admit: 2018-07-26 | Discharge: 2018-07-26 | Disposition: A | Discharge status: Home / Self Care | Payer: Medicaid Other | Source: Ambulatory Visit | Attending: Internal Medicine | Admitting: Internal Medicine

## 2018-07-26 DIAGNOSIS — G4733 Obstructive sleep apnea (adult) (pediatric): Secondary | ICD-10-CM | POA: Insufficient documentation

## 2018-07-26 DIAGNOSIS — E785 Hyperlipidemia, unspecified: Secondary | ICD-10-CM | POA: Diagnosis not present

## 2018-07-26 DIAGNOSIS — Z87891 Personal history of nicotine dependence: Secondary | ICD-10-CM | POA: Diagnosis not present

## 2018-07-26 DIAGNOSIS — Z794 Long term (current) use of insulin: Secondary | ICD-10-CM | POA: Diagnosis not present

## 2018-07-26 DIAGNOSIS — Z7982 Long term (current) use of aspirin: Secondary | ICD-10-CM | POA: Insufficient documentation

## 2018-07-26 DIAGNOSIS — I428 Other cardiomyopathies: Secondary | ICD-10-CM

## 2018-07-26 DIAGNOSIS — N400 Enlarged prostate without lower urinary tract symptoms: Secondary | ICD-10-CM | POA: Diagnosis not present

## 2018-07-26 DIAGNOSIS — Z79899 Other long term (current) drug therapy: Secondary | ICD-10-CM | POA: Diagnosis not present

## 2018-07-26 DIAGNOSIS — Z6839 Body mass index (BMI) 39.0-39.9, adult: Secondary | ICD-10-CM | POA: Insufficient documentation

## 2018-07-26 DIAGNOSIS — I10 Essential (primary) hypertension: Secondary | ICD-10-CM | POA: Diagnosis not present

## 2018-07-26 DIAGNOSIS — K219 Gastro-esophageal reflux disease without esophagitis: Secondary | ICD-10-CM | POA: Diagnosis not present

## 2018-07-26 DIAGNOSIS — I2510 Atherosclerotic heart disease of native coronary artery without angina pectoris: Secondary | ICD-10-CM | POA: Diagnosis not present

## 2018-07-26 DIAGNOSIS — M519 Unspecified thoracic, thoracolumbar and lumbosacral intervertebral disc disorder: Secondary | ICD-10-CM | POA: Diagnosis not present

## 2018-07-26 DIAGNOSIS — I252 Old myocardial infarction: Secondary | ICD-10-CM | POA: Insufficient documentation

## 2018-07-26 DIAGNOSIS — E119 Type 2 diabetes mellitus without complications: Secondary | ICD-10-CM | POA: Insufficient documentation

## 2018-07-26 NOTE — Progress Notes (Signed)
Daily Session Note  Patient Details  Name: Michael Vaughn MRN: [id number] Date of Birth: 1961/07/16 Referring Provider:     CARDIAC REHAB PHASE II ORIENTATION from 04/29/2018 in [place]  Referring Provider  Dr. Gelene Vaughn      Encounter Date: 07/26/2018  Check In: Session Check In - 07/26/18 1100      Check-In   Supervising physician immediately available to respond to emergencies  See telemetry face sheet for immediately available MD    Location  [facility]    Staff Present  Michael Dar, MS, EP, Michael Vaughn, Exercise Physiologist;Michael Vaughn, Exercise Physiologist;Michael Persky Wynetta Emery, RN, BSN    Medication changes reported      No    Fall or balance concerns reported     Yes    Comments  Due to his back surgery his (R) leg gets weak and numb.     Warm-up and Cool-down  Performed as group-led Higher education careers adviser Performed  Yes    VAD Patient?  No    PAD/SET Patient?  No      Pain Assessment   Currently in Pain?  No/denies    Pain Score  0-No pain    Multiple Pain Sites  No       Capillary Blood Glucose: No results found for this or any previous visit (from the past 24 hour(s)).    Social History   Tobacco Use  Smoking Status Former Smoker  . Packs/day: 1.00  . Years: 5.00  . Pack years: 5.00  . Types: Cigarettes  . Last attempt to quit: 09/22/1989  . Years since quitting: 28.8  Smokeless Tobacco Former Systems developer  . Types: Snuff  . Quit date: 09/22/1989  Tobacco Comment   dipped snuff for one year    Goals Met:  Independence with exercise equipment Exercise tolerated well No report of cardiac concerns or symptoms Strength training completed today  Goals Unmet:  Not Applicable  Comments: Pt able to follow exercise prescription today without complaint.  Will continue to monitor for progression. Check out 1200.   Dr. Kate Vaughn is Medical Director for [facility] Cardiac and Pulmonary Rehab.

## 2018-07-26 NOTE — Progress Notes (Signed)
Cardiac Individual Treatment Plan  Patient Details  Name: Michael Vaughn MRN: [id number] Date of Birth: 03/12/1961 Referring Provider:     CARDIAC REHAB PHASE II ORIENTATION from 04/29/2018 in [facility]  Referring Provider  Dr. Beaty      Initial Encounter Date:    CARDIAC REHAB PHASE II ORIENTATION from 04/29/2018 in [place]  Date  04/29/18      Visit Diagnosis: Cardiomyopathy, nonischemic (HCC)  Patient's Home Medications on Admission:  Current Outpatient Medications:  .  allopurinol (ZYLOPRIM) 300 MG tablet, Take 300 mg by mouth daily., Disp: , Rfl:  .  amiodarone (PACERONE) 200 MG tablet, Take 200 mg daily by mouth., Disp: , Rfl:  .  aspirin 81 MG tablet, Take 81 mg by mouth daily. , Disp: , Rfl:  .  furosemide (LASIX) 80 MG tablet, Take 60 mg by mouth daily. , Disp: , Rfl:  .  gabapentin (NEURONTIN) 600 MG tablet, Take 600 mg by mouth 2 (two) times daily. , Disp: , Rfl:  .  glipiZIDE (GLUCOTROL XL) 10 MG 24 hr tablet, Take 10 mg by mouth daily.  , Disp: , Rfl:  .  HYDROcodone-acetaminophen (NORCO/VICODIN) 5-325 MG tablet, Take 1 tablet every 6 (six) hours as needed by mouth for moderate pain., Disp: , Rfl:  .  ibuprofen (ADVIL,MOTRIN) 400 MG tablet, Take 400 mg every 6 (six) hours as needed by mouth., Disp: , Rfl:  .  insulin glargine (LANTUS) 100 UNIT/ML injection, Inject 14 Units into the skin daily., Disp: , Rfl:  .  insulin glargine (LANTUS) 100 UNIT/ML injection, Inject 10 Units into the skin at bedtime., Disp: , Rfl:  .  lisinopril (PRINIVIL,ZESTRIL) 40 MG tablet, Take 40 mg daily by mouth., Disp: , Rfl:  .  metFORMIN (GLUCOPHAGE) 1000 MG tablet, Take 1,000 mg by mouth 2 (two) times daily with a meal., Disp: , Rfl:  .  metoprolol succinate (TOPROL-XL) 25 MG 24 hr tablet, Take 37.5 mg daily by mouth., Disp: , Rfl:  .  Multiple Vitamin (MULTI-VITAMINS) TABS, Take 1 tablet daily by mouth., Disp: , Rfl:  .  omega-3 acid ethyl  esters (LOVAZA) 1 g capsule, Take 1 g daily by mouth., Disp: , Rfl:  .  omeprazole (PRILOSEC) 20 MG capsule, Take 20 mg by mouth daily., Disp: , Rfl:  .  pravastatin (PRAVACHOL) 40 MG tablet, Take 40 mg by mouth daily.  , Disp: , Rfl:  .  ranitidine (ZANTAC) 300 MG tablet, Take 300 mg by mouth 2 (two) times daily., Disp: , Rfl:  .  senna-docusate (SENOKOT-S) 8.6-50 MG tablet, Take 2 tablets at bedtime by mouth., Disp: , Rfl:  .  spironolactone (ALDACTONE) 25 MG tablet, Take 12.5 mg daily by mouth., Disp: , Rfl:  .  tadalafil (CIALIS) 20 MG tablet, Take 1 tablet daily as needed by mouth., Disp: , Rfl: 11 .  tamsulosin (FLOMAX) 0.4 MG CAPS capsule, Take 0.4 mg by mouth daily., Disp: , Rfl:   Past Medical History: Past Medical History:  Diagnosis Date  . Anginal pain (HCC)   . 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 BPH (benign prostatic hyperplasia)   . Bradycardia    July, 2012, related to medication  . CAD (coronary artery disease)    Some coronary irregularities by catheterization 2006 /  nuclear, July, 20 12  ,  question of some ischemia in the lateral wall although technically quite difficult.  . Diabetes mellitus   .   Dyslipidemia   . Dysrhythmia   . Ejection fraction    Improved from the past  /  ejection fraction 50%, echo, July, 2012, hypokinesis at the base of the inferior wall.  [place] Kitchen GERD (gastroesophageal reflux disease)   . Headache   . History of hiatal hernia   . Hypertension   . Hypertension   . Lumbar disc disease   . Morbid obesity ([place])   . Myocardial infarction ([place])   . 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 OSA (obstructive sleep apnea)   . Shortness of breath    September, 2012    Tobacco Use: Social History   Tobacco Use  Smoking Status Former Smoker  . Packs/day: 1.00   . Years: 5.00  . Pack years: 5.00  . Types: Cigarettes  . Last attempt to quit: 09/22/1989  . Years since quitting: 28.8  Smokeless Tobacco Former Systems developer  . Types: Snuff  . Quit date: 09/22/1989  Tobacco Comment   dipped snuff for one year    Labs: Recent Review Flowsheet Data    Labs for ITP Cardiac and Pulmonary Rehab 01/14/2016 01/22/2016 08/04/2017 12/19/2017   Hemoglobin A1c 8.2(H) 7.6(H) 8.6(H) 9.0%      Capillary Blood Glucose: Lab Results  Component Value Date   GLUCAP 174 (H) 06/28/2018   GLUCAP 136 (H) 06/18/2018   GLUCAP 194 (H) 08/13/2017   GLUCAP 203 (H) 08/13/2017   GLUCAP 184 (H) 08/05/2017     Exercise Target Goals: Exercise Program Goal: Individual exercise prescription set using results from initial 6 min walk test and THRR while considering  patient's activity barriers and safety.   Exercise Prescription Goal: Starting with aerobic activity 30 plus minutes a day, 3 days per week for initial exercise prescription. Provide home exercise prescription and guidelines that participant acknowledges understanding prior to discharge.  Activity Barriers & Risk Stratification: Activity Barriers & Cardiac Risk Stratification - 04/30/18 1706      Activity Barriers & Cardiac Risk Stratification   Activity Barriers  Back Problems;Assistive Device;Balance Concerns;Deconditioning   (R) leg weakness   Cardiac Risk Stratification  High       6 Minute Walk: 6 Minute Walk    Row Name 04/29/18 1702         6 Minute Walk   Phase  Initial     Distance  1150 feet     Walk Time  6 minutes     # of Rest Breaks  0     MPH  2.17     METS  2.66     RPE  11     Perceived Dyspnea   9     VO2 Peak  8.53     Symptoms  No     Resting HR  64 bpm     Resting BP  150/80     Resting Oxygen Saturation   98 %     Exercise Oxygen Saturation  during 6 min walk  98 %     Max Ex. HR  78 bpm     Max Ex. BP  174/92     2 Minute Post BP  168/88        Oxygen Initial  Assessment:   Oxygen Re-Evaluation:   Oxygen Discharge (Final Oxygen Re-Evaluation):   Initial Exercise Prescription: Initial Exercise Prescription - 04/30/18 1700  Date of Initial Exercise RX and Referring Provider   Date  04/29/18    Referring Provider  Dr. Beaty    Expected Discharge Date  08/09/18      Treadmill   MPH  1    Grade  0    Minutes  17    METs  1.7      Arm Ergometer   Level  1    Watts  11    RPM  52    Minutes  17    METs  1.8      T5 Nustep   Level  1    SPM  54    Minutes  17    METs  1.7      Prescription Details   Frequency (times per week)  3    Duration  Progress to 30 minutes of continuous aerobic without signs/symptoms of physical distress      Intensity   THRR 40-80% of Max Heartrate  104-124-144    Ratings of Perceived Exertion  11-13    Perceived Dyspnea  0-4      Progression   Progression  Continue progressive overload as per policy without signs/symptoms or physical distress.      Resistance Training   Training Prescription  Yes    Weight  1    Reps  10-15       Perform Capillary Blood Glucose checks as needed.  Exercise Prescription Changes:  Exercise Prescription Changes    Row Name 05/13/18 0700 05/27/18 1000 06/21/18 1400 07/12/18 1500       Response to Exercise   Blood Pressure (Admit)  126/60  138/74  146/80  128/70    Blood Pressure (Exercise)  130/70  150/74  154/80  142/72    Blood Pressure (Exit)  144/70  140/72  142/78  130/72    Heart Rate (Admit)  52 bpm  55 bpm  61 bpm  68 bpm    Heart Rate (Exercise)  63 bpm  73 bpm  77 bpm  93 bpm    Heart Rate (Exit)  61 bpm  64 bpm  70 bpm  78 bpm    Rating of Perceived Exertion (Exercise)  11  11  11  12    Comments  -  -  increased overall MET level   -    Duration  Progress to 30 minutes of  aerobic without signs/symptoms of physical distress  Progress to 30 minutes of  aerobic without signs/symptoms of physical distress  Progress to 30 minutes of  aerobic  without signs/symptoms of physical distress  -    Intensity  THRR New [phone]  THRR unchanged  THRR unchanged  -      Progression   Progression  Continue to progress workloads to maintain intensity without signs/symptoms of physical distress.  Continue to progress workloads to maintain intensity without signs/symptoms of physical distress.  Continue to progress workloads to maintain intensity without signs/symptoms of physical distress.  Continue to progress workloads to maintain intensity without signs/symptoms of physical distress.    Average METs  1.95  2.05  2.15  2.4      Resistance Training   Training Prescription  Yes  Yes  Yes  Yes    Weight  1  3  3  4    Reps  10-15  10-15  10-15  10-15      Treadmill   MPH  1.3  1.3  1.4  1.7      Grade  0  0  0  0    Minutes  _0 METs  1.9  1.9  2  2.3      T5 Nustep   Level  _1 SPM  93  112  120  117    Minutes  _2 METs  2  2.2  2.3  2.5      Home Exercise Plan   Plans to continue exercise at  Home (comment)  Home (comment)  Home (comment)  Home (comment)    Frequency  Add 2 additional days to program exercise sessions.  Add 2 additional days to program exercise sessions.  Add 2 additional days to program exercise sessions.  Add 2 additional days to program exercise sessions.    Initial Home Exercises Provided  05/05/18  05/05/18  05/05/18  05/05/18       Exercise Comments:  Exercise Comments    Row Name 05/17/18 1507 05/27/18 1036 06/07/18 0732 06/28/18 1419 07/23/18 1455   Exercise Comments  Patient is progressing nicely. He is only had 6 visits. His has gained 7.6lbs since his last visit on 05/14/18. Instructed patient to get a scale and weight and keep a record. He has been able to handle the increases in hand held weights and equipment w/o any abnormal S/S.   Patient is progressing nicely. He has had 9 visits. His has lost 5lbs since his last visit on 05/21/18. Instructed patient to get a  scale and weight and keep a record. He has been able to handle the increases in hand held weights and equipment w/o any abnormal S/S.   Patient is doing well. He is on his 13 visit. He has not lost any wieght since diuretic, he is holding steady at current weight.   Patient is still maintaining his weight despite being put on a diuretic. He works hard everyday and has increased his workloads on the equipment as tolerated.   Patient is maintaining weight between 283 and 288, he would like to lose weight. He is working hard in rehab to increse his stamina to allow him to do more outside of rehab.       Exercise Goals and Review:  Exercise Goals    Row Name 04/30/18 1715             Exercise Goals   Increase Physical Activity  Yes       Intervention  Develop an individualized exercise prescription for aerobic and resistive training based on initial evaluation findings, risk stratification, comorbidities and participant's personal goals.;Provide advice, education, support and counseling about physical activity/exercise needs.       Expected Outcomes  Short Term: Attend rehab on a regular basis to increase amount of physical activity.;Long Term: Exercising regularly at least 3-5 days a week.       Increase Strength and Stamina  Yes       Intervention  Provide advice, education, support and counseling about physical activity/exercise needs.;Develop an individualized exercise prescription for aerobic and resistive training based on initial evaluation findings, risk stratification, comorbidities and participant's personal goals.       Expected Outcomes  Short Term: Perform resistance training exercises routinely during rehab and add in resistance training at home;Short Term: Increase workloads from initial exercise prescription for resistance, speed, and METs.;Long Term: Improve cardiorespiratory fitness, muscular endurance and strength  as measured by increased METs and functional capacity (6MWT)        Able to understand and use rate of perceived exertion (RPE) scale  Yes       Intervention  Provide education and explanation on how to use RPE scale       Expected Outcomes  Short Term: Able to use RPE daily in rehab to express subjective intensity level;Long Term:  Able to use RPE to guide intensity level when exercising independently       Knowledge and understanding of Target Heart Rate Range (THRR)  Yes       Intervention  Provide education and explanation of THRR including how the numbers were predicted and where they are located for reference       Expected Outcomes  Short Term: Able to state/look up THRR;Long Term: Able to use THRR to govern intensity when exercising independently       Able to check pulse independently  Yes       Intervention  Provide education and demonstration on how to check pulse in carotid and radial arteries.;Review the importance of being able to check your own pulse for safety during independent exercise       Expected Outcomes  Short Term: Able to explain why pulse checking is important during independent exercise;Long Term: Able to check pulse independently and accurately       Understanding of Exercise Prescription  Yes       Intervention  Provide education, explanation, and written materials on patient's individual exercise prescription       Expected Outcomes  Short Term: Able to explain program exercise prescription;Long Term: Able to explain home exercise prescription to exercise independently          Exercise Goals Re-Evaluation : Exercise Goals Re-Evaluation    Row Name 05/17/18 1503 05/27/18 1032 06/07/18 0724 06/28/18 1416 07/23/18 1450     Exercise Goal Re-Evaluation   Exercise Goals Review  Increase Physical Activity;Increase Strength and Stamina;Able to check pulse independently;Knowledge and understanding of Target Heart Rate Range (THRR)  Increase Physical Activity;Increase Strength and Stamina;Able to check pulse independently;Knowledge and  understanding of Target Heart Rate Range (THRR)  Increase Physical Activity;Increase Strength and Stamina;Knowledge and understanding of Target Heart Rate Range (THRR);Able to check pulse independently;Understanding of Exercise Prescription;Able to understand and use rate of perceived exertion (RPE) scale  Increase Physical Activity;Increase Strength and Stamina;Knowledge and understanding of Target Heart Rate Range (THRR);Able to check pulse independently;Understanding of Exercise Prescription;Able to understand and use rate of perceived exertion (RPE) scale  Increase Physical Activity;Increase Strength and Stamina;Knowledge and understanding of Target Heart Rate Range (THRR);Able to check pulse independently;Understanding of Exercise Prescription;Able to understand and use rate of perceived exertion (RPE) scale   Comments  -  Patient is progressing well in the program and working hard to improve activity. He has increased his overall MET level as well as his dumbbell weight. He was put on a diuretic to aid in losing weight and decrease the amount of fluid.   Patient is progressing well in the program and working hard to improve activity. He has increased his overall MET level as well as his dumbbell weight. He was put on a diuretic to aid in losing weight and decrease the amount of fluid. His weight is holding steady at the same place since given the diuretic.   Patient has continued to work hard in th eprogram and increase his workloads and weights as he is getting stronger. He  is maintaining his weight around 283 lbs, although he is still trying to lose weight. He stated tha the exercise has helped him to feel better and able to do more with his grandkids.   Patient has continued to work hard and progress throughout the program. He has increased to 1.8 MPH on the treadmill and level 3 on the NuStep. He is still working to lose weight.    Expected Outcomes  Improve activity and lose 40lbs  Improve activity and  lose 40lbs  Improve activity and lose 40lbs  Improve overall activity level and lose 40lbs.   Continue to increase strength and stamina. Increase activity level.        Discharge Exercise Prescription (Final Exercise Prescription Changes): Exercise Prescription Changes - 07/12/18 1500      Response to Exercise   Blood Pressure (Admit)  128/70    Blood Pressure (Exercise)  142/72    Blood Pressure (Exit)  130/72    Heart Rate (Admit)  68 bpm    Heart Rate (Exercise)  93 bpm    Heart Rate (Exit)  78 bpm    Rating of Perceived Exertion (Exercise)  12      Progression   Progression  Continue to progress workloads to maintain intensity without signs/symptoms of physical distress.    Average METs  2.4      Resistance Training   Training Prescription  Yes    Weight  4    Reps  10-15      Treadmill   MPH  1.7    Grade  0    Minutes  17    METs  2.3      T5 Nustep   Level  3    SPM  117    Minutes  22    METs  2.5      Home Exercise Plan   Plans to continue exercise at  Home (comment)    Frequency  Add 2 additional days to program exercise sessions.    Initial Home Exercises Provided  05/05/18       Nutrition:  Target Goals: Understanding of nutrition guidelines, daily intake of sodium <1541m, cholesterol <2035m calories 30% from fat and 7% or less from saturated fats, daily to have 5 or more servings of fruits and vegetables.  Biometrics: Pre Biometrics - 04/30/18 1718      Pre Biometrics   Height  5' 11" (1.803 m)    Weight  128.4 kg    Waist Circumference  53 inches    Hip Circumference  43 inches    Waist to Hip Ratio  1.23 %    BMI (Calculated)  39.49    Triceps Skinfold  20 mm    % Body Fat  38.4 %    Grip Strength  29.8 kg   kilograms   Flexibility  10.2 in    Single Leg Stand  38 seconds        Nutrition Therapy Plan and Nutrition Goals: Nutrition Therapy & Goals - 05/27/18 1259      Personal Nutrition Goals   Nutrition Goal  For heart healthy  choices add >50% of whole grains, make half their plate fruits and vegetables. Discuss the difference between starchy vegetables and leafy greens, and how leafy vegetables provide fiber, helps maintain healthy weight, helps control blood glucose, and lowers cholesterol.  Discuss purchasing fresh or frozen vegetable to reduce sodium and not to add grease, fat or sugar. Consume <18oz of red meat  per week. Consume lean cuts of meats and very little of meats high in sodium and nitrates such as pork and lunch meats. Discussed portion control for all food groups.    Additional Goals?  No      Intervention Plan   Intervention  Nutrition handout(s) given to patient.    Expected Outcomes  Short Term Goal: Understand basic principles of dietary content, such as calories, fat, sodium, cholesterol and nutrients.       Nutrition Assessments: Nutrition Assessments - 04/30/18 1724      MEDFICTS Scores   Pre Score  24       Nutrition Goals Re-Evaluation: Nutrition Goals Re-Evaluation    Row Name 06/07/18 1322 06/28/18 1448 07/26/18 1434         Goals   Current Weight  288 lb (130.6 kg)  285 lb 12.8 oz (129.6 kg)  294 lb (133.4 kg)     Nutrition Goal  For heart healthy choices add >50% of whole grains, make half their plate fruits and vegetables. Discuss the difference between starchy vegetables and leafy greens, and how leafy vegetables provide fiber, helps maintain healthy weight, helps control blood glucose, and lowers cholesterol.  Discuss purchasing fresh or frozen vegetable to reduce sodium and not to add grease, fat or sugar. Consume <18oz of red meat per week. Consume lean cuts of meats and very little of meats high in sodium and nitrates such as pork and lunch meats. Discussed portion control for all food groups.  For heart healthy choices add >50% of whole grains, make half their plate fruits and vegetables. Discuss the difference between starchy vegetables and leafy greens, and how leafy  vegetables provide fiber, helps maintain healthy weight, helps control blood glucose, and lowers cholesterol.  Discuss purchasing fresh or frozen vegetable to reduce sodium and not to add grease, fat or sugar. Consume <18oz of red meat per week. Consume lean cuts of meats and very little of meats high in sodium and nitrates such as pork and lunch meats. Discussed portion control for all food groups.  For heart healthy choices add >50% of whole grains, make half their plate fruits and vegetables. Discuss the difference between starchy vegetables and leafy greens, and how leafy vegetables provide fiber, helps maintain healthy weight, helps control blood glucose, and lowers cholesterol.  Discuss purchasing fresh or frozen vegetable to reduce sodium and not to add grease, fat or sugar. Consume <18oz of red meat per week. Consume lean cuts of meats and very little of meats high in sodium and nitrates such as pork and lunch meats. Discussed portion control for all food groups.     Comment  Patient has gained 5 lbs since last 30 day review. His weight does fluctuate. He does increase his Lasix dosage per his MD when he gaines. He continues to say he is eating heart healthy and also following a diabetic diet.   Patient has lost 3 lbs since last 30 day review. His weight continues to fluctuate. He continues to increaase his Lasix dosage per his MD when he gains. He says he continue to try and follow a heart healthy diabetic diet.   Patient has gained 9 lbs since last 30 day review. His weight continues to fluctuate and he continues to adjusts his dosage of Lasix per MD. He does try to follow a diabetic diet.      Expected Outcome  Patient will continue to follow a heart healthy diabetic diet.   Patient will continue to   follow a heart healthy diabetic diet.   Patient will continue to follow a heart healthy diabetic diet.        Personal Goal #2 Re-Evaluation   Personal Goal #2  -  Patient says he continues to eat a low  fat, low sodium diabetic diet.   Patient says he continues to eat a low fat, low sodium diabetic diet.         Nutrition Goals Discharge (Final Nutrition Goals Re-Evaluation): Nutrition Goals Re-Evaluation - 07/26/18 1434      Goals   Current Weight  294 lb (133.4 kg)    Nutrition Goal  For heart healthy choices add >50% of whole grains, make half their plate fruits and vegetables. Discuss the difference between starchy vegetables and leafy greens, and how leafy vegetables provide fiber, helps maintain healthy weight, helps control blood glucose, and lowers cholesterol.  Discuss purchasing fresh or frozen vegetable to reduce sodium and not to add grease, fat or sugar. Consume <18oz of red meat per week. Consume lean cuts of meats and very little of meats high in sodium and nitrates such as pork and lunch meats. Discussed portion control for all food groups.    Comment  Patient has gained 9 lbs since last 30 day review. His weight continues to fluctuate and he continues to adjusts his dosage of Lasix per MD. He does try to follow a diabetic diet.     Expected Outcome  Patient will continue to follow a heart healthy diabetic diet.       Personal Goal #2 Re-Evaluation   Personal Goal #2  Patient says he continues to eat a low fat, low sodium diabetic diet.        Psychosocial: Target Goals: Acknowledge presence or absence of significant depression and/or stress, maximize coping skills, provide positive support system. Participant is able to verbalize types and ability to use techniques and skills needed for reducing stress and depression.  Initial Review & Psychosocial Screening: Initial Psych Review & Screening - 04/30/18 1721      Initial Review   Current issues with  None Identified      Family Dynamics   Good Support System?  Yes      Barriers   Psychosocial barriers to participate in program  There are no identifiable barriers or psychosocial needs.      Screening Interventions    Interventions  Encouraged to exercise    Expected Outcomes  Short Term goal: Identification and review with participant of any Quality of Life or Depression concerns found by scoring the questionnaire.;Long Term goal: The participant improves quality of Life and PHQ9 Scores as seen by post scores and/or verbalization of changes       Quality of Life Scores: Quality of Life - 04/30/18 1722      Quality of Life   Select  Quality of Life      Quality of Life Scores   Health/Function Pre  19.29 %    Socioeconomic Pre  18 %    Psych/Spiritual Pre  24.86 %    Family Pre  24 %    GLOBAL Pre  20.91 %      Scores of 19 and below usually indicate a poorer quality of life in these areas.  A difference of  2-3 points is a clinically meaningful difference.  A difference of 2-3 points in the total score of the Quality of Life Index has been associated with significant improvement in overall quality of life, self-image,   physical symptoms, and general health in studies assessing change in quality of life.  PHQ-9: Recent Review Flowsheet Data    There is no flowsheet data to display.     Interpretation of Total Score  Total Score Depression Severity:  1-4 = Minimal depression, 5-9 = Mild depression, 10-14 = Moderate depression, 15-19 = Moderately severe depression, 20-27 = Severe depression   Psychosocial Evaluation and Intervention: Psychosocial Evaluation - 04/30/18 1722      Psychosocial Evaluation & Interventions   Interventions  Encouraged to exercise with the program and follow exercise prescription    Continue Psychosocial Services   No Follow up required       Psychosocial Re-Evaluation: Psychosocial Re-Evaluation    [place] Name 05/17/18 1414 06/07/18 1343 06/28/18 1448 07/26/18 1442       Psychosocial Re-Evaluation   Current issues with  None Identified  None Identified  None Identified  None Identified    Comments  Patient's initial QOL score was 20.91 and his PHQ-9 score was 3  with no psychosocial issues identified at orientation.  Patient's initial QOL score was 20.91 and his PHQ-9 score was 3 and continues to have no psychosocial issues identified.  Patient's initial QOL score was 20.91 and his PHQ-9 score was 3 and continues to have no psychosocial issues identified.  Patient's initial QOL score was 20.91 and his PHQ-9 score was 3 and continues to have no psychosocial issues identified.    Expected Outcomes  Patient will not have any psychosocial issues identified at [place].   Patient will not have any psychosocial issues identified at [place].   Patient will not have any psychosocial issues identified at [place].   Patient will not have any psychosocial issues identified at [place].     Interventions  Stress management education;Relaxation education;Encouraged to attend Cardiac Rehabilitation for the exercise  Stress management education;Relaxation education;Encouraged to attend Cardiac Rehabilitation for the exercise  Stress management education;Relaxation education;Encouraged to attend Cardiac Rehabilitation for the exercise  Stress management education;Relaxation education;Encouraged to attend Cardiac Rehabilitation for the exercise    Continue Psychosocial Services   No Follow up required  No Follow up required  No Follow up required  No Follow up required       Psychosocial Discharge (Final Psychosocial Re-Evaluation): Psychosocial Re-Evaluation - 07/26/18 1442      Psychosocial Re-Evaluation   Current issues with  None Identified    Comments  Patient's initial QOL score was 20.91 and his PHQ-9 score was 3 and continues to have no psychosocial issues identified.    Expected Outcomes  Patient will not have any psychosocial issues identified at [place].     Interventions  Stress management education;Relaxation education;Encouraged to attend Cardiac Rehabilitation for the exercise    Continue Psychosocial Services   No Follow up required        Vocational Rehabilitation: Provide vocational rehab assistance to qualifying candidates.   Vocational Rehab Evaluation & Intervention: Vocational Rehab - 04/30/18 1725      Initial Vocational Rehab Evaluation & Intervention   Assessment shows need for Vocational Rehabilitation  No   Disabled      Education: Education Goals: Education classes will be provided on a weekly basis, covering required topics. Participant will state understanding/return demonstration of topics presented.  Learning Barriers/Preferences: Learning Barriers/Preferences - 04/30/18 1724      Learning Barriers/Preferences   Learning Barriers  None    Learning Preferences  Individual Instruction;Group Instruction       Education Topics: Hypertension, Hypertension Reduction -  Define heart disease and high blood pressure. Discus how high blood pressure affects the body and ways to reduce high blood pressure.   Exercise and Your Heart -Discuss why it is important to exercise, the FITT principles of exercise, normal and abnormal responses to exercise, and how to exercise safely.   CARDIAC REHAB PHASE II EXERCISE from 07/14/2018 in [place] [facility]  Date  05/05/18  Educator  D. Coad  Instruction Review Code  2- Demonstrated Understanding      Angina -Discuss definition of angina, causes of angina, treatment of angina, and how to decrease risk of having angina.   Cardiac Medications -Review what the following cardiac medications are used for, how they affect the body, and side effects that may occur when taking the medications.  Medications include Aspirin, Beta blockers, calcium channel blockers, ACE Inhibitors, angiotensin receptor blockers, diuretics, digoxin, and antihyperlipidemics.   CARDIAC REHAB PHASE II EXERCISE from 07/14/2018 in [place] [facility]  Date  05/19/18  Educator  D. Johnson  Instruction Review Code  2- Demonstrated Understanding      Congestive  Heart Failure -Discuss the definition of CHF, how to live with CHF, the signs and symptoms of CHF, and how keep track of weight and sodium intake.   CARDIAC REHAB PHASE II EXERCISE from 07/14/2018 in [place] [facility]  Date  05/26/18  Educator  D. Coad   Instruction Review Code  2- Demonstrated Understanding      Heart Disease and Intimacy -Discus the effect sexual activity has on the heart, how changes occur during intimacy as we age, and safety during sexual activity.   CARDIAC REHAB PHASE II EXERCISE from 07/14/2018 in [place] [facility]  Date  06/02/18  Educator  D. Johnson  Instruction Review Code  2- Demonstrated Understanding      Smoking Cessation / COPD -Discuss different methods to quit smoking, the health benefits of quitting smoking, and the definition of COPD.   CARDIAC REHAB PHASE II EXERCISE from 07/14/2018 in [place] [facility]  Date  06/09/18  Educator  D. Johnson  Instruction Review Code  2- Demonstrated Understanding      Nutrition I: Fats -Discuss the types of cholesterol, what cholesterol does to the heart, and how cholesterol levels can be controlled.   CARDIAC REHAB PHASE II EXERCISE from 07/14/2018 in [place] [facility]  Date  06/16/18  Educator  D. Coad  Instruction Review Code  2- Demonstrated Understanding      Nutrition II: Labels -Discuss the different components of food labels and how to read food label   CARDIAC REHAB PHASE II EXERCISE from 07/14/2018 in [place] [facility]  Date  06/23/18  Educator  D. Johnson  Instruction Review Code  2- Demonstrated Understanding      Heart Parts/Heart Disease and PAD -Discuss the anatomy of the heart, the pathway of blood circulation through the heart, and these are affected by heart disease.   CARDIAC REHAB PHASE II EXERCISE from 07/14/2018 in [place] C[facility]IAC [facility]  Date  06/30/18  Educator  D. Johnson   Instruction Review Code  2- Demonstrated Understanding      Stress I: Signs and Symptoms -Discuss the causes of stress, how stress may lead to anxiety and depression, and ways to limit stress.   CARDIAC REHAB PHASE II EXERCISE from 07/14/2018 in [place] C[facility]AC [facility]  Date  07/07/18  Educator  D. Coad  Instruction Review Code  2- Demonstrated Understanding        Stress II: Relaxation -Discuss different types of relaxation techniques to limit stress.   CARDIAC REHAB PHASE II EXERCISE from 07/14/2018 in [place] C[facility]AC [facility]  Date  07/14/18  Educator  D. Coad   Instruction Review Code  2- Demonstrated Understanding      Warning Signs of Stroke / TIA -Discuss definition of a stroke, what the signs and symptoms are of a stroke, and how to identify when someone is having stroke.   Knowledge Questionnaire Score: Knowledge Questionnaire Score - 04/30/18 1725      Knowledge Questionnaire Score   Pre Score  21/24       Core Components/Risk Factors/Patient Goals at Admission: Personal Goals and Risk Factors at Admission - 04/30/18 1725      Core Components/Risk Factors/Patient Goals on Admission    Weight Management  Yes    Intervention  Weight Management/Obesity: Establish reasonable short term and long term weight goals.    Admit Weight  283 lb (128.4 kg)    Goal Weight: Short Term  273 lb (123.8 kg)    Goal Weight: Long Term  263 lb (119.3 kg)    Expected Outcomes  Short Term: Continue to assess and modify interventions until short term weight is achieved;Long Term: Adherence to nutrition and physical activity/exercise program aimed toward attainment of established weight goal    Personal Goal Other  Yes    Personal Goal  Improve activity, Lose 40 lbs overall. Will work at losing 15-20lbs while in program.     Intervention  Attend program 3 x week and supplement exercise at home 2 x week.     Expected Outcomes  Reach personal goals.        Core  Components/Risk Factors/Patient Goals Review:  Goals and Risk Factor Review    Row Name 05/17/18 1408 06/07/18 1325 06/28/18 1439 07/26/18 1436       Core Components/Risk Factors/Patient Goals Review   Personal Goals Review  Weight Management/Obesity;Diabetes;Hypertension Improve activity; lose weigth 40 lbs long term.  Weight Management/Obesity;Diabetes;Hypertension Improve activity; lose weight 40 lbs.   Weight Management/Obesity;Diabetes;Hypertension Improve activity; lose weight 40 lbs.   Weight Management/Obesity;Diabetes;Hypertension Improve activity; lose weight 40 lbs long term.     Review  Patient has completed 6 sessions gaining 7 lbs since his orientation visit. He says he feels better after only 6 sessions with increased strength. He says he recently had an A1C which was 6.7 down from 7.5 on his previous one. His blood pressure remains hypertensive. Will continue to monitor for progress.   Patient has completed 14 sessions gaining 5 lbs since last 30 day review. His weight does fluctuate due to fluid. He has been told by MD to increase his Lasix dosage for 3 days if he gains over 5 lbs in one week. No recent A1C. His blood pressure does continue to fluctuate based on his fluid status. He continues to do well in the program with progression. He continues to say he is feeling stronger with increased energy. He says he is doing more at home now without difficulty. He is very pleased with his progress in the program and says he feels better overall. Will continue to monitor for progress.    Patient has completed 22 sessions losing 3 lbs since last 30 day review. His weight continues to fluctuate. He continues to increase his Lasix dosage for 3 days if he gains over 5 lbs in one week. His last A1C on file was 07/2017 at 8.6. His reported fasting glucose   readings are usually greater than 150. He continues to do well in the program with progression. He continues to say the program is helping him get  stronger with more energy. He says he is able to do more around the house and keep up with his grandkids. He recently complained of feeling some chest tightness and nausea during exericise. He says he feels this at times and it will last a few minutes. He plans to call and report this to his cardiolgoist. Will continue to monitor for progress.   Patient has completed 31 sessions gaining 9 lbs since last 30 day review. His weight continues to fluctuate and he continues to adjust his Lasix dosage accordingly. He continues to do well in the program with progression.  His fasting glucose reading average 150. No recent A1C on file. He continues to say he does feel stronger and has more energy. He had a URI last week that made him week and caused him to miss 3 sessions. Will continue to monitor for progress.     Expected Outcomes  Patient will continue to attend sessions and complete the program meeting his personal goals.   Patient will continue to attend sessions and complete the program meeting his personal goals.   Patient will continue to attend sessions and complete the program meeting his personal goals.   Patient will continue to attend sessions and complete the program meeting his personal goals.        Core Components/Risk Factors/Patient Goals at Discharge (Final Review):  Goals and Risk Factor Review - 07/26/18 1436      Core Components/Risk Factors/Patient Goals Review   Personal Goals Review  Weight Management/Obesity;Diabetes;Hypertension   Improve activity; lose weight 40 lbs long term.    Review  Patient has completed 31 sessions gaining 9 lbs since last 30 day review. His weight continues to fluctuate and he continues to adjust his Lasix dosage accordingly. He continues to do well in the program with progression.  His fasting glucose reading average 150. No recent A1C on file. He continues to say he does feel stronger and has more energy. He had a URI last week that made him week and caused  him to miss 3 sessions. Will continue to monitor for progress.     Expected Outcomes  Patient will continue to attend sessions and complete the program meeting his personal goals.        ITP Comments: ITP Comments    Row Name 04/30/18 1519 05/03/18 0734 05/27/18 1259 06/18/18 1130 06/28/18 1450   ITP Comments  Patient was sent from [facility]. 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 He is eager to get started.   Patient new to program. Plans to start Wednesday 05/05/18.  Patient attended family life matter class with hospital chaplian to discuss how this recent event has impacted his life.    Patient complained of feeling "sluggish" today while on the treadmill. He says he started feeling this way on his drive over and felt like his blood sugar may be low. We check it at 136 mg/dl. He wanted to finish the session. He checked out without complaints.   Patient complained of feeling nausea with some chest tightness today on his second 17 minute sessions. His glucose was checked at 174 mg/dl. He says he has this feeling frequently and has some NTG SL to take but did not have any with him. He refused to go to the ED but said he would call his cardiologist   when he gets home to let them know he has had several episodes of chest tightness with nausea. Will continue to monitor.       Comments: ITP REVIEW Patient is doing well in the program. Will continue to monitor for progress.   

## 2018-07-28 ENCOUNTER — Encounter (HOSPITAL_COMMUNITY)
Admission: RE | Admit: 2018-07-28 | Discharge: 2018-07-28 | Disposition: A | Discharge status: Home / Self Care | Payer: Medicaid Other | Source: Ambulatory Visit | Attending: Internal Medicine | Admitting: Internal Medicine

## 2018-07-28 DIAGNOSIS — I428 Other cardiomyopathies: Secondary | ICD-10-CM

## 2018-07-28 NOTE — Progress Notes (Signed)
Daily Session Note  Patient Details  Name: Michael Vaughn MRN: [id number] Date of Birth: 04-Sep-1961 Referring Provider:     CARDIAC REHAB PHASE II ORIENTATION from 04/29/2018 in [place]  Referring Provider  [name]      Encounter Date: 07/28/2018  Check In: Session Check In - 07/28/18 1100      Check-In   Supervising physician immediately available to respond to emergencies  See telemetry face sheet for immediately available MD    Location  [facility]    Staff Present  Russella Dar, MS, EP, [facility], Exercise Physiologist;Amanda Ballard, Exercise Physiologist;Anaya Bovee Wynetta Emery, RN, BSN    Medication changes reported      No    Fall or balance concerns reported     Yes    Comments  Due to his back surgery his (R) leg gets weak and numb.     Resistance Training Performed  Yes    VAD Patient?  No    PAD/SET Patient?  No      Pain Assessment   Currently in Pain?  No/denies    Pain Score  0-No pain    Multiple Pain Sites  No       Capillary Blood Glucose: No results found for this or any previous visit (from the past 24 hour(s)).    Social History   Tobacco Use  Smoking Status Former Smoker  . Packs/day: 1.00  . Years: 5.00  . Pack years: 5.00  . Types: Cigarettes  . Last attempt to quit: 09/22/1989  . Years since quitting: 28.8  Smokeless Tobacco Former Systems developer  . Types: Snuff  . Quit date: 09/22/1989  Tobacco Comment   dipped snuff for one year    Goals Met:  Independence with exercise equipment Exercise tolerated well No report of cardiac concerns or symptoms Strength training completed today  Goals Unmet:  Not Applicable  Comments: Pt able to follow exercise prescription today without complaint.  Will continue to monitor for progression. Check out 1200.   Dr. Kate Sable is Medical Director for [facility] Cardiac and Pulmonary Rehab.

## 2018-07-30 ENCOUNTER — Encounter (HOSPITAL_COMMUNITY)
Admission: RE | Admit: 2018-07-30 | Discharge: 2018-07-30 | Disposition: A | Discharge status: Home / Self Care | Payer: Medicaid Other | Source: Ambulatory Visit | Attending: Internal Medicine | Admitting: Internal Medicine

## 2018-07-30 VITALS — Ht 71.0 in | Wt 282.9 lb

## 2018-07-30 DIAGNOSIS — I428 Other cardiomyopathies: Secondary | ICD-10-CM | POA: Diagnosis not present

## 2018-07-30 NOTE — Progress Notes (Signed)
Daily Session Note  Patient Details  Name: Michael Vaughn MRN: [id number] Date of Birth: [birth year] Referring Provider:     CARDIAC REHAB PHASE II ORIENTATION from 04/29/2018 in [place]  Referring Provider  [name]      Encounter Date: 07/30/2018  Check In: Session Check In - 07/30/18 1100      Check-In   Supervising physician immediately available to respond to emergencies  See telemetry face sheet for immediately available MD    Location  [facility]    Staff Present  Russella Dar, MS, EP, [facility], Exercise Physiologist;Gaytha Raybourn, Exercise Physiologist;Debra Wynetta Emery, RN, BSN    Medication changes reported      No    Fall or balance concerns reported     Yes    Comments  Due to his back surgery his (R) leg gets weak and numb.     Tobacco Cessation  No Change    Warm-up and Cool-down  Performed as group-led instruction    Resistance Training Performed  Yes    VAD Patient?  No    PAD/SET Patient?  No      Pain Assessment   Currently in Pain?  No/denies    Pain Score  0-No pain    Multiple Pain Sites  No       Capillary Blood Glucose: No results found for this or any previous visit (from the past 24 hour(s)).    Social History   Tobacco Use  Smoking Status Former Smoker  . Packs/day: 1.00  . Years: 5.00  . Pack years: 5.00  . Types: Cigarettes  . Last attempt to quit: 09/22/1989  . Years since quitting: 28.8  Smokeless Tobacco Former Systems developer  . Types: Snuff  . Quit date: 09/22/1989  Tobacco Comment   dipped snuff for one year    Goals Met:  Independence with exercise equipment Exercise tolerated well No report of cardiac concerns or symptoms Strength training completed today  Goals Unmet:  Not Applicable  Comments: Pt able to follow exercise prescription today without complaint.  Will continue to monitor for progression. Check out 12:00.   Dr. Kate Sable is Medical Director for [facility] Cardiac and Pulmonary  Rehab.

## 2018-08-02 ENCOUNTER — Encounter (HOSPITAL_COMMUNITY)
Admission: RE | Admit: 2018-08-02 | Discharge: 2018-08-02 | Disposition: A | Discharge status: Home / Self Care | Payer: Medicaid Other | Source: Ambulatory Visit | Attending: Internal Medicine | Admitting: Internal Medicine

## 2018-08-02 DIAGNOSIS — I428 Other cardiomyopathies: Secondary | ICD-10-CM | POA: Diagnosis not present

## 2018-08-02 NOTE — Progress Notes (Signed)
Daily Session Note  Patient Details  Name: LUCIOUS ZOU MRN: [id number] Date of Birth: July 18, 1961 Referring Provider:     CARDIAC REHAB PHASE II ORIENTATION from 04/29/2018 in [place]  Referring Provider  [name]      Encounter Date: 08/02/2018  Check In: Session Check In - 08/02/18 1120      Check-In   Supervising physician immediately available to respond to emergencies  See telemetry face sheet for immediately available MD    Location  [facility]    Staff Present  Russella Dar, MS, EP, [facility], Exercise Physiologist;Debra Wynetta Emery, RN, BSN    Medication changes reported      No    Fall or balance concerns reported     Yes    Tobacco Cessation  --   Quit 1994   Warm-up and Cool-down  Performed as group-led instruction    Resistance Training Performed  Yes    VAD Patient?  No    PAD/SET Patient?  No      Pain Assessment   Currently in Pain?  No/denies    Pain Score  0-No pain    Multiple Pain Sites  No       Capillary Blood Glucose: No results found for this or any previous visit (from the past 24 hour(s)).    Social History   Tobacco Use  Smoking Status Former Smoker  . Packs/day: 1.00  . Years: 5.00  . Pack years: 5.00  . Types: Cigarettes  . Last attempt to quit: 09/22/1989  . Years since quitting: 28.8  Smokeless Tobacco Former Systems developer  . Types: Snuff  . Quit date: 09/22/1989  Tobacco Comment   dipped snuff for one year    Goals Met:  Independence with exercise equipment Exercise tolerated well Personal goals reviewed No report of cardiac concerns or symptoms Strength training completed today  Goals Unmet:  Not Applicable  Comments: Check out: 1200   Dr. Kate Sable is Medical Director for [place] and Pulmonary Rehab.

## 2018-08-04 ENCOUNTER — Encounter (HOSPITAL_COMMUNITY)
Admission: RE | Admit: 2018-08-04 | Discharge: 2018-08-04 | Disposition: A | Discharge status: Home / Self Care | Payer: Medicaid Other | Source: Ambulatory Visit | Attending: Internal Medicine | Admitting: Internal Medicine

## 2018-08-04 DIAGNOSIS — I428 Other cardiomyopathies: Secondary | ICD-10-CM | POA: Diagnosis not present

## 2018-08-04 NOTE — Progress Notes (Signed)
Daily Session Note  Patient Details  Name: VAIDEN ADAMES MRN: [id number] Date of Birth: 04-24-1961 Referring Provider:     CARDIAC REHAB PHASE II ORIENTATION from 04/29/2018 in [place]  Referring Provider  [name]      Encounter Date: 08/04/2018  Check In: Session Check In - 08/04/18 1100      Check-In   Supervising physician immediately available to respond to emergencies  See telemetry face sheet for immediately available MD    Location  [facility]    Staff Present  Russella Dar, MS, EP, [facility], Exercise Physiologist;Anabela Crayton Wynetta Emery, RN, [facility], Exercise Physiologist    Medication changes reported      No    Fall or balance concerns reported     Yes    Comments  Due to his back surgery his (R) leg gets weak and numb.     Warm-up and Cool-down  Performed as group-led Higher education careers adviser Performed  Yes    VAD Patient?  No    PAD/SET Patient?  No      Pain Assessment   Currently in Pain?  No/denies    Pain Score  0-No pain    Multiple Pain Sites  No       Capillary Blood Glucose: No results found for this or any previous visit (from the past 24 hour(s)).    Social History   Tobacco Use  Smoking Status Former Smoker  . Packs/day: 1.00  . Years: 5.00  . Pack years: 5.00  . Types: Cigarettes  . Last attempt to quit: 09/22/1989  . Years since quitting: 28.8  Smokeless Tobacco Former Systems developer  . Types: Snuff  . Quit date: 09/22/1989  Tobacco Comment   dipped snuff for one year    Goals Met:  Independence with exercise equipment Exercise tolerated well No report of cardiac concerns or symptoms Strength training completed today  Goals Unmet:  Not Applicable  Comments: Pt able to follow exercise prescription today without complaint.  Will continue to monitor for progression. Check out 1200.   Dr. Kate Sable is Medical Director for [facility] Cardiac and Pulmonary Rehab.

## 2018-08-06 ENCOUNTER — Encounter (HOSPITAL_COMMUNITY)
Admission: RE | Admit: 2018-08-06 | Discharge: 2018-08-06 | Disposition: A | Discharge status: Home / Self Care | Payer: Medicaid Other | Source: Ambulatory Visit | Attending: Internal Medicine | Admitting: Internal Medicine

## 2018-08-06 DIAGNOSIS — I428 Other cardiomyopathies: Secondary | ICD-10-CM | POA: Diagnosis not present

## 2018-08-06 NOTE — Progress Notes (Signed)
Cardiac Individual Treatment Plan  Patient Details  Name: Michael Vaughn MRN: [id number] Date of Birth: 06/06/1961 Referring Provider:     CARDIAC REHAB PHASE II ORIENTATION from 04/29/2018 in [facility]  Referring Provider  Dr. Beaty      Initial Encounter Date:    CARDIAC REHAB PHASE II ORIENTATION from 04/29/2018 in  CARDIAC REHABILITATION  Date  04/29/18      Visit Diagnosis: Cardiomyopathy, nonischemic (HCC)  Patient's Home Medications on Admission:  Current Outpatient Medications:  .  allopurinol (ZYLOPRIM) 300 MG tablet, Take 300 mg by mouth daily., Disp: , Rfl:  .  amiodarone (PACERONE) 200 MG tablet, Take 200 mg daily by mouth., Disp: , Rfl:  .  aspirin 81 MG tablet, Take 81 mg by mouth daily. , Disp: , Rfl:  .  furosemide (LASIX) 80 MG tablet, Take 60 mg by mouth daily. , Disp: , Rfl:  .  gabapentin (NEURONTIN) 600 MG tablet, Take 600 mg by mouth 2 (two) times daily. , Disp: , Rfl:  .  glipiZIDE (GLUCOTROL XL) 10 MG 24 hr tablet, Take 10 mg by mouth daily.  , Disp: , Rfl:  .  HYDROcodone-acetaminophen (NORCO/VICODIN) 5-325 MG tablet, Take 1 tablet every 6 (six) hours as needed by mouth for moderate pain., Disp: , Rfl:  .  ibuprofen (ADVIL,MOTRIN) 400 MG tablet, Take 400 mg every 6 (six) hours as needed by mouth., Disp: , Rfl:  .  insulin glargine (LANTUS) 100 UNIT/ML injection, Inject 14 Units into the skin daily., Disp: , Rfl:  .  insulin glargine (LANTUS) 100 UNIT/ML injection, Inject 10 Units into the skin at bedtime., Disp: , Rfl:  .  lisinopril (PRINIVIL,ZESTRIL) 40 MG tablet, Take 40 mg daily by mouth., Disp: , Rfl:  .  metFORMIN (GLUCOPHAGE) 1000 MG tablet, Take 1,000 mg by mouth 2 (two) times daily with a meal., Disp: , Rfl:  .  metoprolol succinate (TOPROL-XL) 25 MG 24 hr tablet, Take 37.5 mg daily by mouth., Disp: , Rfl:  .  Multiple Vitamin (MULTI-VITAMINS) TABS, Take 1 tablet daily by mouth., Disp: , Rfl:  .  omega-3 acid ethyl  esters (LOVAZA) 1 g capsule, Take 1 g daily by mouth., Disp: , Rfl:  .  omeprazole (PRILOSEC) 20 MG capsule, Take 20 mg by mouth daily., Disp: , Rfl:  .  pravastatin (PRAVACHOL) 40 MG tablet, Take 40 mg by mouth daily.  , Disp: , Rfl:  .  ranitidine (ZANTAC) 300 MG tablet, Take 300 mg by mouth 2 (two) times daily., Disp: , Rfl:  .  senna-docusate (SENOKOT-S) 8.6-50 MG tablet, Take 2 tablets at bedtime by mouth., Disp: , Rfl:  .  spironolactone (ALDACTONE) 25 MG tablet, Take 12.5 mg daily by mouth., Disp: , Rfl:  .  tadalafil (CIALIS) 20 MG tablet, Take 1 tablet daily as needed by mouth., Disp: , Rfl: 11 .  tamsulosin (FLOMAX) 0.4 MG CAPS capsule, Take 0.4 mg by mouth daily., Disp: , Rfl:   Past Medical History: Past Medical History:  Diagnosis Date  . Anginal pain (HCC)   . 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 BPH (benign prostatic hyperplasia)   . Bradycardia    July, 2012, related to medication  . CAD (coronary artery disease)    Some coronary irregularities by catheterization 2006 /  nuclear, July, 20 12  ,  question of some ischemia in the lateral wall although technically quite difficult.  . Diabetes mellitus   .   Dyslipidemia   . Dysrhythmia   . Ejection fraction    Improved from the past  /  ejection fraction 50%, echo, July, 2012, hypokinesis at the base of the inferior wall.  Marland Kitchen GERD (gastroesophageal reflux disease)   . Headache   . History of hiatal hernia   . Hypertension   . Hypertension   . Lumbar disc disease   . Morbid obesity ([place])   . Myocardial infarction ([place])   . 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 OSA (obstructive sleep apnea)   . Shortness of breath    September, 2012    Tobacco Use: Social History   Tobacco Use  Smoking Status Former Smoker  . Packs/day: 1.00   . Years: 5.00  . Pack years: 5.00  . Types: Cigarettes  . Last attempt to quit: 09/22/1989  . Years since quitting: 28.8  Smokeless Tobacco Former Systems developer  . Types: Snuff  . Quit date: 09/22/1989  Tobacco Comment   dipped snuff for one year    Labs: Recent Review Flowsheet Data    Labs for ITP Cardiac and Pulmonary Rehab 01/14/2016 01/22/2016 08/04/2017 12/19/2017   Hemoglobin A1c 8.2(H) 7.6(H) 8.6(H) 9.0%      Capillary Blood Glucose: Lab Results  Component Value Date   GLUCAP 174 (H) 06/28/2018   GLUCAP 136 (H) 06/18/2018   GLUCAP 194 (H) 08/13/2017   GLUCAP 203 (H) 08/13/2017   GLUCAP 184 (H) 08/05/2017     Exercise Target Goals: Exercise Program Goal: Individual exercise prescription set using results from initial 6 min walk test and THRR while considering  patient's activity barriers and safety.   Exercise Prescription Goal: Starting with aerobic activity 30 plus minutes a day, 3 days per week for initial exercise prescription. Provide home exercise prescription and guidelines that participant acknowledges understanding prior to discharge.  Activity Barriers & Risk Stratification: Activity Barriers & Cardiac Risk Stratification - 04/30/18 1706      Activity Barriers & Cardiac Risk Stratification   Activity Barriers  Back Problems;Assistive Device;Balance Concerns;Deconditioning   (R) leg weakness   Cardiac Risk Stratification  High       6 Minute Walk: 6 Minute Walk    Row Name 04/29/18 1702 07/30/18 1147       6 Minute Walk   Phase  Initial  Discharge    Distance  1150 feet  1250 feet    Distance % Change  -  8.7 %    Distance Feet Change  -  100 ft    Walk Time  6 minutes  6 minutes    # of Rest Breaks  0  0    MPH  2.17  2.36    METS  2.66  2.81    RPE  11  12    Perceived Dyspnea   9  8    VO2 Peak  8.53  9.88    Symptoms  No  No    Resting HR  64 bpm  71 bpm    Resting BP  150/80  138/80    Resting Oxygen Saturation   98 %  98 %    Exercise Oxygen  Saturation  during 6 min walk  98 %  98 %    Max Ex. HR  78 bpm  86 bpm    Max Ex. BP  174/92  146/80  2 Minute Post BP  168/88  136/62       Oxygen Initial Assessment:   Oxygen Re-Evaluation:   Oxygen Discharge (Final Oxygen Re-Evaluation):   Initial Exercise Prescription: Initial Exercise Prescription - 04/30/18 1700      Date of Initial Exercise RX and Referring Provider   Date  04/29/18    Referring Provider  [name]    Expected Discharge Date  08/09/18      Treadmill   MPH  1    Grade  0    Minutes  17    METs  1.7      Arm Ergometer   Level  1    Watts  11    RPM  52    Minutes  17    METs  1.8      T5 Nustep   Level  1    SPM  54    Minutes  17    METs  1.7      Prescription Details   Frequency (times per week)  3    Duration  Progress to 30 minutes of continuous aerobic without signs/symptoms of physical distress      Intensity   THRR 40-80% of Max Heartrate  [phone]    Ratings of Perceived Exertion  11-13    Perceived Dyspnea  0-4      Progression   Progression  Continue progressive overload as per policy without signs/symptoms or physical distress.      Resistance Training   Training Prescription  Yes    Weight  1    Reps  10-15       Perform Capillary Blood Glucose checks as needed.  Exercise Prescription Changes:  Exercise Prescription Changes    Row Name 05/13/18 0700 05/27/18 1000 06/21/18 1400 07/12/18 1500 07/30/18 1500     Response to Exercise   Blood Pressure (Admit)  126/60  138/74  146/80  128/70  138/80   Blood Pressure (Exercise)  130/70  150/74  154/80  142/72  146/80   Blood Pressure (Exit)  144/70  140/72  142/78  130/72  140/80   Heart Rate (Admit)  52 bpm  55 bpm  61 bpm  68 bpm  90 bpm   Heart Rate (Exercise)  63 bpm  73 bpm  77 bpm  93 bpm  90 bpm   Heart Rate (Exit)  61 bpm  64 bpm  70 bpm  78 bpm  84 bpm   Rating of Perceived Exertion (Exercise)  '11  11  11  12  12   '$ Comments  -  -  increased overall  MET level   -  -   Duration  Progress to 30 minutes of  aerobic without signs/symptoms of physical distress  Progress to 30 minutes of  aerobic without signs/symptoms of physical distress  Progress to 30 minutes of  aerobic without signs/symptoms of physical distress  -  Progress to 30 minutes of  aerobic without signs/symptoms of physical distress   Intensity  THRR New [phone]  THRR unchanged  THRR unchanged  -  THRR unchanged     Progression   Progression  Continue to progress workloads to maintain intensity without signs/symptoms of physical distress.  Continue to progress workloads to maintain intensity without signs/symptoms of physical distress.  Continue to progress workloads to maintain intensity without signs/symptoms of physical distress.  Continue to progress workloads to maintain intensity without signs/symptoms of physical distress.  Continue to progress workloads  to maintain intensity without signs/symptoms of physical distress.   Average METs  1.95  2.05  2.15  2.4  2.38     Resistance Training   Training Prescription  Yes  Yes  Yes  Yes  Yes   Weight  '1  3  3  4  4   '$ Reps  10-15  10-15  10-15  10-15  10-15     Treadmill   MPH  1.3  1.3  1.4  1.7  1.8   Grade  0  0  0  0  0   Minutes  '17  17  17  17  17   '$ METs  1.9  1.9  2  2.3  2.37     T5 Nustep   Level  '1  1  2  3  3   '$ SPM  93  112  120  117  123   Minutes  '22  22  22  22  22   '$ METs  2  2.2  2.3  2.5  2.4     Home Exercise Plan   Plans to continue exercise at  Home (comment)  Home (comment)  Home (comment)  Home (comment)  Home (comment)   Frequency  Add 2 additional days to program exercise sessions.  Add 2 additional days to program exercise sessions.  Add 2 additional days to program exercise sessions.  Add 2 additional days to program exercise sessions.  Add 2 additional days to program exercise sessions.   Initial Home Exercises Provided  05/05/18  05/05/18  05/05/18  05/05/18  05/05/18      Exercise  Comments:  Exercise Comments    Row Name 05/17/18 1507 05/27/18 1036 06/07/18 0732 06/28/18 1419 07/23/18 1455   Exercise Comments  Patient is progressing nicely. He is only had 6 visits. His has gained 7.6lbs since his last visit on 05/14/18. Instructed patient to get a scale and weight and keep a record. He has been able to handle the increases in hand held weights and equipment w/o any abnormal S/S.   Patient is progressing nicely. He has had 9 visits. His has lost 5lbs since his last visit on 05/21/18. Instructed patient to get a scale and weight and keep a record. He has been able to handle the increases in hand held weights and equipment w/o any abnormal S/S.   Patient is doing well. He is on his 13 visit. He has not lost any wieght since diuretic, he is holding steady at current weight.   Patient is still maintaining his weight despite being put on a diuretic. He works hard everyday and has increased his workloads on the equipment as tolerated.   Patient is maintaining weight between 283 and 288, he would like to lose weight. He is working hard in rehab to increse his stamina to allow him to do more outside of rehab.       Exercise Goals and Review:  Exercise Goals    Row Name 04/30/18 1715             Exercise Goals   Increase Physical Activity  Yes       Intervention  Develop an individualized exercise prescription for aerobic and resistive training based on initial evaluation findings, risk stratification, comorbidities and participant's personal goals.;Provide advice, education, support and counseling about physical activity/exercise needs.       Expected Outcomes  Short Term: Attend rehab on a regular basis to increase amount of physical  activity.;Long Term: Exercising regularly at least 3-5 days a week.       Increase Strength and Stamina  Yes       Intervention  Provide advice, education, support and counseling about physical activity/exercise needs.;Develop an individualized  exercise prescription for aerobic and resistive training based on initial evaluation findings, risk stratification, comorbidities and participant's personal goals.       Expected Outcomes  Short Term: Perform resistance training exercises routinely during rehab and add in resistance training at home;Short Term: Increase workloads from initial exercise prescription for resistance, speed, and METs.;Long Term: Improve cardiorespiratory fitness, muscular endurance and strength as measured by increased METs and functional capacity (6MWT)       Able to understand and use rate of perceived exertion (RPE) scale  Yes       Intervention  Provide education and explanation on how to use RPE scale       Expected Outcomes  Short Term: Able to use RPE daily in rehab to express subjective intensity level;Long Term:  Able to use RPE to guide intensity level when exercising independently       Knowledge and understanding of Target Heart Rate Range (THRR)  Yes       Intervention  Provide education and explanation of THRR including how the numbers were predicted and where they are located for reference       Expected Outcomes  Short Term: Able to state/look up THRR;Long Term: Able to use THRR to govern intensity when exercising independently       Able to check pulse independently  Yes       Intervention  Provide education and demonstration on how to check pulse in carotid and radial arteries.;Review the importance of being able to check your own pulse for safety during independent exercise       Expected Outcomes  Short Term: Able to explain why pulse checking is important during independent exercise;Long Term: Able to check pulse independently and accurately       Understanding of Exercise Prescription  Yes       Intervention  Provide education, explanation, and written materials on patient's individual exercise prescription       Expected Outcomes  Short Term: Able to explain program exercise prescription;Long Term:  Able to explain home exercise prescription to exercise independently          Exercise Goals Re-Evaluation : Exercise Goals Re-Evaluation    Row Name 05/17/18 1503 05/27/18 1032 06/07/18 0724 06/28/18 1416 07/23/18 1450     Exercise Goal Re-Evaluation   Exercise Goals Review  Increase Physical Activity;Increase Strength and Stamina;Able to check pulse independently;Knowledge and understanding of Target Heart Rate Range (THRR)  Increase Physical Activity;Increase Strength and Stamina;Able to check pulse independently;Knowledge and understanding of Target Heart Rate Range (THRR)  Increase Physical Activity;Increase Strength and Stamina;Knowledge and understanding of Target Heart Rate Range (THRR);Able to check pulse independently;Understanding of Exercise Prescription;Able to understand and use rate of perceived exertion (RPE) scale  Increase Physical Activity;Increase Strength and Stamina;Knowledge and understanding of Target Heart Rate Range (THRR);Able to check pulse independently;Understanding of Exercise Prescription;Able to understand and use rate of perceived exertion (RPE) scale  Increase Physical Activity;Increase Strength and Stamina;Knowledge and understanding of Target Heart Rate Range (THRR);Able to check pulse independently;Understanding of Exercise Prescription;Able to understand and use rate of perceived exertion (RPE) scale   Comments  -  Patient is progressing well in the program and working hard to improve activity. He has increased his overall  MET level as well as his dumbbell weight. He was put on a diuretic to aid in losing weight and decrease the amount of fluid.   Patient is progressing well in the program and working hard to improve activity. He has increased his overall MET level as well as his dumbbell weight. He was put on a diuretic to aid in losing weight and decrease the amount of fluid. His weight is holding steady at the same place since given the diuretic.   Patient has  continued to work hard in th eprogram and increase his workloads and weights as he is getting stronger. He is maintaining his weight around 283 lbs, although he is still trying to lose weight. He stated tha the exercise has helped him to feel better and able to do more with his grandkids.   Patient has continued to work hard and progress throughout the program. He has increased to 1.8 MPH on the treadmill and level 3 on the NuStep. He is still working to lose weight.    Expected Outcomes  Improve activity and lose 40lbs  Improve activity and lose 40lbs  Improve activity and lose 40lbs  Improve overall activity level and lose 40lbs.   Continue to increase strength and stamina. Increase activity level.        Discharge Exercise Prescription (Final Exercise Prescription Changes): Exercise Prescription Changes - 07/30/18 1500      Response to Exercise   Blood Pressure (Admit)  138/80    Blood Pressure (Exercise)  146/80    Blood Pressure (Exit)  140/80    Heart Rate (Admit)  90 bpm    Heart Rate (Exercise)  90 bpm    Heart Rate (Exit)  84 bpm    Rating of Perceived Exertion (Exercise)  12    Duration  Progress to 30 minutes of  aerobic without signs/symptoms of physical distress    Intensity  THRR unchanged      Progression   Progression  Continue to progress workloads to maintain intensity without signs/symptoms of physical distress.    Average METs  2.38      Resistance Training   Training Prescription  Yes    Weight  4    Reps  10-15      Treadmill   MPH  1.8    Grade  0    Minutes  17    METs  2.37      T5 Nustep   Level  3    SPM  123    Minutes  22    METs  2.4      Home Exercise Plan   Plans to continue exercise at  Home (comment)    Frequency  Add 2 additional days to program exercise sessions.    Initial Home Exercises Provided  05/05/18       Nutrition:  Target Goals: Understanding of nutrition guidelines, daily intake of sodium '1500mg'$ , cholesterol '200mg'$ ,  calories 30% from fat and 7% or less from saturated fats, daily to have 5 or more servings of fruits and vegetables.  Biometrics: Pre Biometrics - 04/30/18 1718      Pre Biometrics   Height  '5\' 11"'$  (1.803 m)    Weight  128.4 kg    Waist Circumference  53 inches    Hip Circumference  43 inches    Waist to Hip Ratio  1.23 %    BMI (Calculated)  39.49    Triceps Skinfold  20 mm    %  Body Fat  38.4 %    Grip Strength  29.8 kg   kilograms   Flexibility  10.2 in    Single Leg Stand  38 seconds      Post Biometrics - 07/30/18 1149       Post  Biometrics   Height  '5\' 11"'$  (1.803 m)    Weight  128.3 kg    Waist Circumference  51.5 inches    Hip Circumference  44 inches    Waist to Hip Ratio  1.17 %    BMI (Calculated)  39.47    Triceps Skinfold  15 mm    % Body Fat  36.6 %    Grip Strength  31 kg    Flexibility  0 in    Single Leg Stand  21 seconds       Nutrition Therapy Plan and Nutrition Goals: Nutrition Therapy & Goals - 05/27/18 1259      Personal Nutrition Goals   Nutrition Goal  For heart healthy choices add >50% of whole grains, make half their plate fruits and vegetables. Discuss the difference between starchy vegetables and leafy greens, and how leafy vegetables provide fiber, helps maintain healthy weight, helps control blood glucose, and lowers cholesterol.  Discuss purchasing fresh or frozen vegetable to reduce sodium and not to add grease, fat or sugar. Consume <18oz of red meat per week. Consume lean cuts of meats and very little of meats high in sodium and nitrates such as pork and lunch meats. Discussed portion control for all food groups.    Additional Goals?  No      Intervention Plan   Intervention  Nutrition handout(s) given to patient.    Expected Outcomes  Short Term Goal: Understand basic principles of dietary content, such as calories, fat, sodium, cholesterol and nutrients.       Nutrition Assessments: Nutrition Assessments - 08/06/18 1502       MEDFICTS Scores   Pre Score  24    Post Score  23    Score Difference  -1       Nutrition Goals Re-Evaluation: Nutrition Goals Re-Evaluation    [place] Name 06/07/18 1322 06/28/18 1448 07/26/18 1434         Goals   Current Weight  288 lb (130.6 kg)  285 lb 12.8 oz (129.6 kg)  294 lb (133.4 kg)     Nutrition Goal  For heart healthy choices add >50% of whole grains, make half their plate fruits and vegetables. Discuss the difference between starchy vegetables and leafy greens, and how leafy vegetables provide fiber, helps maintain healthy weight, helps control blood glucose, and lowers cholesterol.  Discuss purchasing fresh or frozen vegetable to reduce sodium and not to add grease, fat or sugar. Consume <18oz of red meat per week. Consume lean cuts of meats and very little of meats high in sodium and nitrates such as pork and lunch meats. Discussed portion control for all food groups.  For heart healthy choices add >50% of whole grains, make half their plate fruits and vegetables. Discuss the difference between starchy vegetables and leafy greens, and how leafy vegetables provide fiber, helps maintain healthy weight, helps control blood glucose, and lowers cholesterol.  Discuss purchasing fresh or frozen vegetable to reduce sodium and not to add grease, fat or sugar. Consume <18oz of red meat per week. Consume lean cuts of meats and very little of meats high in sodium and nitrates such as pork and lunch meats. Discussed portion control  for all food groups.  For heart healthy choices add >50% of whole grains, make half their plate fruits and vegetables. Discuss the difference between starchy vegetables and leafy greens, and how leafy vegetables provide fiber, helps maintain healthy weight, helps control blood glucose, and lowers cholesterol.  Discuss purchasing fresh or frozen vegetable to reduce sodium and not to add grease, fat or sugar. Consume <18oz of red meat per week. Consume lean cuts of meats and  very little of meats high in sodium and nitrates such as pork and lunch meats. Discussed portion control for all food groups.     Comment  Patient has gained 5 lbs since last 30 day review. His weight does fluctuate. He does increase his Lasix dosage per his MD when he gaines. He continues to say he is eating heart healthy and also following a diabetic diet.   Patient has lost 3 lbs since last 30 day review. His weight continues to fluctuate. He continues to increaase his Lasix dosage per his MD when he gains. He says he continue to try and follow a heart healthy diabetic diet.   Patient has gained 9 lbs since last 30 day review. His weight continues to fluctuate and he continues to adjusts his dosage of Lasix per MD. He does try to follow a diabetic diet.      Expected Outcome  Patient will continue to follow a heart healthy diabetic diet.   Patient will continue to follow a heart healthy diabetic diet.   Patient will continue to follow a heart healthy diabetic diet.        Personal Goal #2 Re-Evaluation   Personal Goal #2  -  Patient says he continues to eat a low fat, low sodium diabetic diet.   Patient says he continues to eat a low fat, low sodium diabetic diet.         Nutrition Goals Discharge (Final Nutrition Goals Re-Evaluation): Nutrition Goals Re-Evaluation - 07/26/18 1434      Goals   Current Weight  294 lb (133.4 kg)    Nutrition Goal  For heart healthy choices add >50% of whole grains, make half their plate fruits and vegetables. Discuss the difference between starchy vegetables and leafy greens, and how leafy vegetables provide fiber, helps maintain healthy weight, helps control blood glucose, and lowers cholesterol.  Discuss purchasing fresh or frozen vegetable to reduce sodium and not to add grease, fat or sugar. Consume <18oz of red meat per week. Consume lean cuts of meats and very little of meats high in sodium and nitrates such as pork and lunch meats. Discussed portion control for  all food groups.    Comment  Patient has gained 9 lbs since last 30 day review. His weight continues to fluctuate and he continues to adjusts his dosage of Lasix per MD. He does try to follow a diabetic diet.     Expected Outcome  Patient will continue to follow a heart healthy diabetic diet.       Personal Goal #2 Re-Evaluation   Personal Goal #2  Patient says he continues to eat a low fat, low sodium diabetic diet.        Psychosocial: Target Goals: Acknowledge presence or absence of significant depression and/or stress, maximize coping skills, provide positive support system. Participant is able to verbalize types and ability to use techniques and skills needed for reducing stress and depression.  Initial Review & Psychosocial Screening: Initial Psych Review & Screening - 04/30/18 1721  Initial Review   Current issues with  None Identified      Family Dynamics   Good Support System?  Yes      Barriers   Psychosocial barriers to participate in program  There are no identifiable barriers or psychosocial needs.      Screening Interventions   Interventions  Encouraged to exercise    Expected Outcomes  Short Term goal: Identification and review with participant of any Quality of Life or Depression concerns found by scoring the questionnaire.;Long Term goal: The participant improves quality of Life and PHQ9 Scores as seen by post scores and/or verbalization of changes       Quality of Life Scores: Quality of Life - 07/30/18 1150      Quality of Life   Select  Quality of Life      Quality of Life Scores   Health/Function Pre  19.29 %    Health/Function Post  18 %    Health/Function % Change  -6.69 %    Socioeconomic Pre  18 %    Socioeconomic Post  17.14 %    Socioeconomic % Change   -4.78 %    Psych/Spiritual Pre  24.86 %    Psych/Spiritual Post  24 %    Psych/Spiritual % Change  -3.46 %    Family Pre  24 %    Family Post  30 %    Family % Change  25 %    GLOBAL Pre   20.91 %    GLOBAL Post  20.55 %    GLOBAL % Change  -1.72 %      Scores of 19 and below usually indicate a poorer quality of life in these areas.  A difference of  2-3 points is a clinically meaningful difference.  A difference of 2-3 points in the total score of the Quality of Life Index has been associated with significant improvement in overall quality of life, self-image, physical symptoms, and general health in studies assessing change in quality of life.  PHQ-9: Recent Review Flowsheet Data    Depression screen [facility] 2/9 08/06/2018   Decreased Interest 0   Down, Depressed, Hopeless 0   PHQ - 2 Score 0   Altered sleeping 1   Tired, decreased energy 1   Change in appetite 0   Feeling bad or failure about yourself  0   Trouble concentrating 0   Moving slowly or fidgety/restless 0   PHQ-9 Score 2     Interpretation of Total Score  Total Score Depression Severity:  1-4 = Minimal depression, 5-9 = Mild depression, 10-14 = Moderate depression, 15-19 = Moderately severe depression, 20-27 = Severe depression   Psychosocial Evaluation and Intervention: Psychosocial Evaluation - 08/06/18 1507      Discharge Psychosocial Assessment & Intervention   Comments  Patient has no psychosocial issues identified at discharge. His exit QOL score decreased by 1.74% and his exit PHQ-9 score was 2 at discharge and 1 initially.        Psychosocial Re-Evaluation: Psychosocial Re-Evaluation    [place] Name 05/17/18 1414 06/07/18 1343 06/28/18 1448 07/26/18 1442       Psychosocial Re-Evaluation   Current issues with  None Identified  None Identified  None Identified  None Identified    Comments  Patient's initial QOL score was 20.91 and his PHQ-9 score was 3 with no psychosocial issues identified at orientation.  Patient's initial QOL score was 20.91 and his PHQ-9 score was 3 and continues to have  no psychosocial issues identified.  Patient's initial QOL score was 20.91 and his PHQ-9 score was 3 and  continues to have no psychosocial issues identified.  Patient's initial QOL score was 20.91 and his PHQ-9 score was 3 and continues to have no psychosocial issues identified.    Expected Outcomes  Patient will not have any psychosocial issues identified at [place].   Patient will not have any psychosocial issues identified at [place].   Patient will not have any psychosocial issues identified at [place].   Patient will not have any psychosocial issues identified at [place].     Interventions  Stress management education;Relaxation education;Encouraged to attend Cardiac Rehabilitation for the exercise  Stress management education;Relaxation education;Encouraged to attend Cardiac Rehabilitation for the exercise  Stress management education;Relaxation education;Encouraged to attend Cardiac Rehabilitation for the exercise  Stress management education;Relaxation education;Encouraged to attend Cardiac Rehabilitation for the exercise    Continue Psychosocial Services   No Follow up required  No Follow up required  No Follow up required  No Follow up required       Psychosocial Discharge (Final Psychosocial Re-Evaluation): Psychosocial Re-Evaluation - 07/26/18 1442      Psychosocial Re-Evaluation   Current issues with  None Identified    Comments  Patient's initial QOL score was 20.91 and his PHQ-9 score was 3 and continues to have no psychosocial issues identified.    Expected Outcomes  Patient will not have any psychosocial issues identified at [place].     Interventions  Stress management education;Relaxation education;Encouraged to attend Cardiac Rehabilitation for the exercise    Continue Psychosocial Services   No Follow up required       Vocational Rehabilitation: Provide vocational rehab assistance to qualifying candidates.   Vocational Rehab Evaluation & Intervention: Vocational Rehab - 04/30/18 1725      Initial Vocational Rehab Evaluation & Intervention   Assessment shows  need for Vocational Rehabilitation  No   Disabled      Education: Education Goals: Education classes will be provided on a weekly basis, covering required topics. Participant will state understanding/return demonstration of topics presented.  Learning Barriers/Preferences: Learning Barriers/Preferences - 04/30/18 1724      Learning Barriers/Preferences   Learning Barriers  None    Learning Preferences  Individual Instruction;Group Instruction       Education Topics: Hypertension, Hypertension Reduction -Define heart disease and high blood pressure. Discus how high blood pressure affects the body and ways to reduce high blood pressure.   CARDIAC REHAB PHASE II EXERCISE from 08/04/2018 in [place]  Date  07/28/18  Educator  D. Coad  Instruction Review Code  2- Demonstrated Understanding      Exercise and Your Heart -Discuss why it is important to exercise, the FITT principles of exercise, normal and abnormal responses to exercise, and how to exercise safely.   CARDIAC REHAB PHASE II EXERCISE from 08/04/2018 in [place]  Date  05/05/18  Educator  D. Coad  Instruction Review Code  2- Demonstrated Understanding      Angina -Discuss definition of angina, causes of angina, treatment of angina, and how to decrease risk of having angina.   Cardiac Medications -Review what the following cardiac medications are used for, how they affect the body, and side effects that may occur when taking the medications.  Medications include Aspirin, Beta blockers, calcium channel blockers, ACE Inhibitors, angiotensin receptor blockers, diuretics, digoxin, and antihyperlipidemics.   CARDIAC REHAB PHASE II EXERCISE from 08/04/2018 in [place]  REHABILITATION  Date  05/19/18  Educator  Etheleen Mayhew  Instruction Review Code  2- Demonstrated Understanding      Congestive Heart Failure -Discuss the definition of CHF, how to live with CHF,  the signs and symptoms of CHF, and how keep track of weight and sodium intake.   CARDIAC REHAB PHASE II EXERCISE from 08/04/2018 in [place]  Date  05/26/18  Educator  D. Coad   Instruction Review Code  2- Demonstrated Understanding      Heart Disease and Intimacy -Discus the effect sexual activity has on the heart, how changes occur during intimacy as we age, and safety during sexual activity.   CARDIAC REHAB PHASE II EXERCISE from 08/04/2018 in [place]  Date  06/02/18  Educator  Etheleen Mayhew  Instruction Review Code  2- Demonstrated Understanding      Smoking Cessation / COPD -Discuss different methods to quit smoking, the health benefits of quitting smoking, and the definition of COPD.   CARDIAC REHAB PHASE II EXERCISE from 08/04/2018 in [place]  Date  06/09/18  Educator  Etheleen Mayhew  Instruction Review Code  2- Demonstrated Understanding      Nutrition I: Fats -Discuss the types of cholesterol, what cholesterol does to the heart, and how cholesterol levels can be controlled.   CARDIAC REHAB PHASE II EXERCISE from 08/04/2018 in [place]  Date  06/16/18  Educator  D. Coad  Instruction Review Code  2- Demonstrated Understanding      Nutrition II: Labels -Discuss the different components of food labels and how to read food label   [place] from 08/04/2018 in [place]  Date  06/23/18  Educator  Etheleen Mayhew  Instruction Review Code  2- Demonstrated Understanding      Heart Parts/Heart Disease and PAD -Discuss the anatomy of the heart, the pathway of blood circulation through the heart, and these are affected by heart disease.   CARDIAC REHAB PHASE II EXERCISE from 08/04/2018 in [place]  Date  06/30/18  Educator  Etheleen Mayhew  Instruction Review Code  2- Demonstrated Understanding      Stress I:  Signs and Symptoms -Discuss the causes of stress, how stress may lead to anxiety and depression, and ways to limit stress.   CARDIAC REHAB PHASE II EXERCISE from 08/04/2018 in [place]  Date  07/07/18  Educator  D. Coad  Instruction Review Code  2- Demonstrated Understanding      Stress II: Relaxation -Discuss different types of relaxation techniques to limit stress.   CARDIAC REHAB PHASE II EXERCISE from 08/04/2018 in [place]  Date  07/14/18  Educator  D. Coad   Instruction Review Code  2- Demonstrated Understanding      Warning Signs of Stroke / TIA -Discuss definition of a stroke, what the signs and symptoms are of a stroke, and how to identify when someone is having stroke.   Knowledge Questionnaire Score: Knowledge Questionnaire Score - 08/06/18 1502      Knowledge Questionnaire Score   Pre Score  21/24    Post Score  24/24       Core Components/Risk Factors/Patient Goals at Admission: Personal Goals and Risk Factors at Admission - 04/30/18 1725      Core Components/Risk Factors/Patient Goals on Admission    Weight Management  Yes    Intervention  Weight Management/Obesity: Establish reasonable short term  and long term weight goals.    Admit Weight  283 lb (128.4 kg)    Goal Weight: Short Term  273 lb (123.8 kg)    Goal Weight: Long Term  263 lb (119.3 kg)    Expected Outcomes  Short Term: Continue to assess and modify interventions until short term weight is achieved;Long Term: Adherence to nutrition and physical activity/exercise program aimed toward attainment of established weight goal    Personal Goal Other  Yes    Personal Goal  Improve activity, Lose 40 lbs overall. Will work at losing 15-20lbs while in program.     Intervention  Attend program 3 x week and supplement exercise at home 2 x week.     Expected Outcomes  Reach personal goals.        Core Components/Risk Factors/Patient Goals Review:  Goals and  Risk Factor Review    Row Name 05/17/18 1408 06/07/18 1325 06/28/18 1439 07/26/18 1436 08/06/18 1502     Core Components/Risk Factors/Patient Goals Review   Personal Goals Review  Weight Management/Obesity;Diabetes;Hypertension Improve activity; lose weigth 40 lbs long term.  Weight Management/Obesity;Diabetes;Hypertension Improve activity; lose weight 40 lbs.   Weight Management/Obesity;Diabetes;Hypertension Improve activity; lose weight 40 lbs.   Weight Management/Obesity;Diabetes;Hypertension Improve activity; lose weight 40 lbs long term.   Weight Management/Obesity;Diabetes;Hypertension Increase activity; lose 40 lbs long term.    Review  Patient has completed 6 sessions gaining 7 lbs since his orientation visit. He says he feels better after only 6 sessions with increased strength. He says he recently had an A1C which was 6.7 down from 7.5 on his previous one. His blood pressure remains hypertensive. Will continue to monitor for progress.   Patient has completed 14 sessions gaining 5 lbs since last 30 day review. His weight does fluctuate due to fluid. He has been told by MD to increase his Lasix dosage for 3 days if he gains over 5 lbs in one week. No recent A1C. His blood pressure does continue to fluctuate based on his fluid status. He continues to do well in the program with progression. He continues to say he is feeling stronger with increased energy. He says he is doing more at home now without difficulty. He is very pleased with his progress in the program and says he feels better overall. Will continue to monitor for progress.    Patient has completed 22 sessions losing 3 lbs since last 30 day review. His weight continues to fluctuate. He continues to increase his Lasix dosage for 3 days if he gains over 5 lbs in one week. His last A1C on file was 07/2017 at 8.6. His reported fasting glucose readings are usually greater than 150. He continues to do well in the program with progression. He  continues to say the program is helping him get stronger with more energy. He says he is able to do more around the house and keep up with his grandkids. He recently complained of feeling some chest tightness and nausea during exericise. He says he feels this at times and it will last a few minutes. He plans to call and report this to his cardiolgoist. Will continue to monitor for progress.   Patient has completed 31 sessions gaining 9 lbs since last 30 day review. His weight continues to fluctuate and he continues to adjust his Lasix dosage accordingly. He continues to do well in the program with progression.  His fasting glucose reading average 150. No recent A1C on file. He continues to  say he does feel stronger and has more energy. He had a URI last week that made him week and caused him to miss 3 sessions. Will continue to monitor for progress.   Patient graduated with 36 sessions gaining 3 lbs overall. His weight did fluctuate throughout the program. He did well in the program. His exit walk test improved by 8.7%. His blood pressure also improved with the walk test both initial and during the walk test by 28 mmhg. He continues to use a cane for ambulation but says his gait and balance have improved in the program. He says he is pleased with his progress in the program and does feel better overall and a lot stronger and has more energy and stamina. He plans [facility] at [facility]. CR will f/u for one year.     Expected Outcomes  Patient will continue to attend sessions and complete the program meeting his personal goals.   Patient will continue to attend sessions and complete the program meeting his personal goals.   Patient will continue to attend sessions and complete the program meeting his personal goals.   Patient will continue to attend sessions and complete the program meeting his personal goals.   Patient will continue exercising at [facility] and continue to meet his personal  goals.       Core Components/Risk Factors/Patient Goals at Discharge (Final Review):  Goals and Risk Factor Review - 08/06/18 1502      Core Components/Risk Factors/Patient Goals Review   Personal Goals Review  Weight Management/Obesity;Diabetes;Hypertension   Increase activity; lose 40 lbs long term.    Review  Patient graduated with 36 sessions gaining 3 lbs overall. His weight did fluctuate throughout the program. He did well in the program. His exit walk test improved by 8.7%. His blood pressure also improved with the walk test both initial and during the walk test by 28 mmhg. He continues to use a cane for ambulation but says his gait and balance have improved in the program. He says he is pleased with his progress in the program and does feel better overall and a lot stronger and has more energy and stamina. He plans on continuing exercising at [facility]. CR will f/u for one year.      Expected Outcomes  Patient will continue exercising at [facility] and continue to meet his personal goals.        ITP Comments: ITP Comments    Row Name 04/30/18 1519 05/03/18 0734 05/27/18 1259 06/18/18 1130 06/28/18 1450   ITP Comments  Patient was sent from [facility]. 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 He is eager to get started.   Patient new to program. Plans to start Wednesday 05/05/18.  Patient attended family life matter class with hospital chaplian to discuss how this recent event has impacted his life.    Patient complained of feeling "sluggish" today while on the treadmill. He says he started feeling this way on his drive over and felt like his blood sugar may be low. We check it at 136 mg/dl. He wanted to finish the session. He checked out without complaints.   Patient complained of feeling nausea with some chest tightness today on his second 17 minute sessions. His glucose was checked at 174 mg/dl. He says he has this feeling frequently and has some NTG SL to  take but did not have any with him. He refused to go to the ED but said he  would call his cardiologist when he gets home to let them know he has had several episodes of chest tightness with nausea. Will continue to monitor.       Comments: Patient graduated from [facility] today on 08/06/18 after completing 36 sessions. He achieved LTG of 30 minutes of aerobic exercise at Max Met level of 2.6. All patients vitals are WNL. Patient has met with dietician. Discharge instruction has been reviewed in detail and patient stated an understanding of material given. Patient plans to continue exercising at the [facility]. Cardiac Rehab staff will make f/u calls at 1 month, 6 months, and 1 year. Patient had no complaints of any abnormal S/S or pain on their exit visit.

## 2018-08-06 NOTE — Progress Notes (Signed)
Daily Session Note  Patient Details  Name: Michael Vaughn MRN: [id number] Date of Birth: 1960/11/05 Referring Provider:     CARDIAC REHAB PHASE II ORIENTATION from 04/29/2018 in [place]  Referring Provider  [name]      Encounter Date: 08/06/2018  Check In: Session Check In - 08/06/18 1100      Check-In   Supervising physician immediately available to respond to emergencies  See telemetry face sheet for immediately available MD    Location  [facility]    Staff Present  Russella Dar, MS, EP, [facility], Exercise Physiologist;Japheth Diekman Wynetta Emery, RN, [facility], Exercise Physiologist    Medication changes reported      No    Fall or balance concerns reported     Yes    Comments  Due to his back surgery his (R) leg gets weak and numb.     Warm-up and Cool-down  Performed as group-led Higher education careers adviser Performed  Yes    VAD Patient?  No    PAD/SET Patient?  No      Pain Assessment   Currently in Pain?  No/denies    Pain Score  0-No pain    Multiple Pain Sites  No       Capillary Blood Glucose: No results found for this or any previous visit (from the past 24 hour(s)).    Social History   Tobacco Use  Smoking Status Former Smoker  . Packs/day: 1.00  . Years: 5.00  . Pack years: 5.00  . Types: Cigarettes  . Last attempt to quit: 09/22/1989  . Years since quitting: 28.8  Smokeless Tobacco Former Systems developer  . Types: Snuff  . Quit date: 09/22/1989  Tobacco Comment   dipped snuff for one year    Goals Met:  Independence with exercise equipment Exercise tolerated well No report of cardiac concerns or symptoms Strength training completed today  Goals Unmet:  Not Applicable  Comments: Pt able to follow exercise prescription today without complaint.  Will continue to monitor for progression. Check out 1200.   Dr. Kate Sable is Medical Director for [facility] Cardiac and Pulmonary Rehab.

## 2018-08-06 NOTE — Progress Notes (Signed)
Discharge Progress Report  Patient Details  Name: Michael Vaughn MRN: [id number] Date of Birth: 1961/04/13 Referring Provider:     CARDIAC REHAB PHASE II ORIENTATION from 04/29/2018 in [facility]  Referring Provider  [name]       Number of Visits: 36  Reason for Discharge:  Patient reached a stable level of exercise. Patient independent in their exercise. Patient has met program and personal goals.  Smoking History:  Social History   Tobacco Use  Smoking Status Former Smoker  . Packs/day: 1.00  . Years: 5.00  . Pack years: 5.00  . Types: Cigarettes  . Last attempt to quit: 09/22/1989  . Years since quitting: 28.8  Smokeless Tobacco Former Systems developer  . Types: Snuff  . Quit date: 09/22/1989  Tobacco Comment   dipped snuff for one year    Diagnosis:  Cardiomyopathy, nonischemic ([place])  ADL UCSD:   Initial Exercise Prescription: Initial Exercise Prescription - 04/30/18 1700      Date of Initial Exercise RX and Referring Provider   Date  04/29/18    Referring Provider  [name]    Expected Discharge Date  08/09/18      Treadmill   MPH  1    Grade  0    Minutes  17    METs  1.7      Arm Ergometer   Level  1    Watts  11    RPM  52    Minutes  17    METs  1.8      T5 Nustep   Level  1    SPM  54    Minutes  17    METs  1.7      Prescription Details   Frequency (times per week)  3    Duration  Progress to 30 minutes of continuous aerobic without signs/symptoms of physical distress      Intensity   THRR 40-80% of Max Heartrate  [phone]    Ratings of Perceived Exertion  11-13    Perceived Dyspnea  0-4      Progression   Progression  Continue progressive overload as per policy without signs/symptoms or physical distress.      Resistance Training   Training Prescription  Yes    Weight  1    Reps  10-15       Discharge Exercise Prescription (Final Exercise Prescription Changes): Exercise Prescription Changes - 07/30/18 1500       Response to Exercise   Blood Pressure (Admit)  138/80    Blood Pressure (Exercise)  146/80    Blood Pressure (Exit)  140/80    Heart Rate (Admit)  90 bpm    Heart Rate (Exercise)  90 bpm    Heart Rate (Exit)  84 bpm    Rating of Perceived Exertion (Exercise)  12    Duration  Progress to 30 minutes of  aerobic without signs/symptoms of physical distress    Intensity  THRR unchanged      Progression   Progression  Continue to progress workloads to maintain intensity without signs/symptoms of physical distress.    Average METs  2.38      Resistance Training   Training Prescription  Yes    Weight  4    Reps  10-15      Treadmill   MPH  1.8    Grade  0    Minutes  17    METs  2.37  T5 Nustep   Level  3    SPM  123    Minutes  22    METs  2.4      Home Exercise Plan   Plans to continue exercise at  Home (comment)    Frequency  Add 2 additional days to program exercise sessions.    Initial Home Exercises Provided  05/05/18       Functional Capacity: 6 Minute Walk    Row Name 04/29/18 1702 07/30/18 1147       6 Minute Walk   Phase  Initial  Discharge    Distance  1150 feet  1250 feet    Distance % Change  -  8.7 %    Distance Feet Change  -  100 ft    Walk Time  6 minutes  6 minutes    # of Rest Breaks  0  0    MPH  2.17  2.36    METS  2.66  2.81    RPE  11  12    Perceived Dyspnea   9  8    VO2 Peak  8.53  9.88    Symptoms  No  No    Resting HR  64 bpm  71 bpm    Resting BP  150/80  138/80    Resting Oxygen Saturation   98 %  98 %    Exercise Oxygen Saturation  during 6 min walk  98 %  98 %    Max Ex. HR  78 bpm  86 bpm    Max Ex. BP  174/92  146/80    2 Minute Post BP  168/88  136/62       Psychological, QOL, Others - Outcomes: PHQ 2/9: Depression screen PHQ 2/9 08/06/2018  Decreased Interest 0  Down, Depressed, Hopeless 0  PHQ - 2 Score 0  Altered sleeping 1  Tired, decreased energy 1  Change in appetite 0  Feeling bad or failure about  yourself  0  Trouble concentrating 0  Moving slowly or fidgety/restless 0  PHQ-9 Score 2    Quality of Life: Quality of Life - 07/30/18 1150      Quality of Life   Select  Quality of Life      Quality of Life Scores   Health/Function Pre  19.29 %    Health/Function Post  18 %    Health/Function % Change  -6.69 %    Socioeconomic Pre  18 %    Socioeconomic Post  17.14 %    Socioeconomic % Change   -4.78 %    Psych/Spiritual Pre  24.86 %    Psych/Spiritual Post  24 %    Psych/Spiritual % Change  -3.46 %    Family Pre  24 %    Family Post  30 %    Family % Change  25 %    GLOBAL Pre  20.91 %    GLOBAL Post  20.55 %    GLOBAL % Change  -1.72 %       Personal Goals: Goals established at orientation with interventions provided to work toward goal. Personal Goals and Risk Factors at Admission - 04/30/18 1725      Core Components/Risk Factors/Patient Goals on Admission    Weight Management  Yes    Intervention  Weight Management/Obesity: Establish reasonable short term and long term weight goals.    Admit Weight  283 lb (128.4 kg)    Goal Weight: Short  Term  273 lb (123.8 kg)    Goal Weight: Long Term  263 lb (119.3 kg)    Expected Outcomes  Short Term: Continue to assess and modify interventions until short term weight is achieved;Long Term: Adherence to nutrition and physical activity/exercise program aimed toward attainment of established weight goal    Personal Goal Other  Yes    Personal Goal  Improve activity, Lose 40 lbs overall. Will work at losing 15-20lbs while in program.     Intervention  Attend program 3 x week and supplement exercise at home 2 x week.     Expected Outcomes  Reach personal goals.         Personal Goals Discharge: Goals and Risk Factor Review    Row Name 05/17/18 1408 06/07/18 1325 06/28/18 1439 07/26/18 1436 08/06/18 1502     Core Components/Risk Factors/Patient Goals Review   Personal Goals Review  Weight  Management/Obesity;Diabetes;Hypertension Improve activity; lose weigth 40 lbs long term.  Weight Management/Obesity;Diabetes;Hypertension Improve activity; lose weight 40 lbs.   Weight Management/Obesity;Diabetes;Hypertension Improve activity; lose weight 40 lbs.   Weight Management/Obesity;Diabetes;Hypertension Improve activity; lose weight 40 lbs long term.   Weight Management/Obesity;Diabetes;Hypertension Increase activity; lose 40 lbs long term.    Review  Patient has completed 6 sessions gaining 7 lbs since his orientation visit. He says he feels better after only 6 sessions with increased strength. He says he recently had an A1C which was 6.7 down from 7.5 on his previous one. His blood pressure remains hypertensive. Will continue to monitor for progress.   Patient has completed 14 sessions gaining 5 lbs since last 30 day review. His weight does fluctuate due to fluid. He has been told by MD to increase his Lasix dosage for 3 days if he gains over 5 lbs in one week. No recent A1C. His blood pressure does continue to fluctuate based on his fluid status. He continues to do well in the program with progression. He continues to say he is feeling stronger with increased energy. He says he is doing more at home now without difficulty. He is very pleased with his progress in the program and says he feels better overall. Will continue to monitor for progress.    Patient has completed 22 sessions losing 3 lbs since last 30 day review. His weight continues to fluctuate. He continues to increase his Lasix dosage for 3 days if he gains over 5 lbs in one week. His last A1C on file was 07/2017 at 8.6. His reported fasting glucose readings are usually greater than 150. He continues to do well in the program with progression. He continues to say the program is helping him get stronger with more energy. He says he is able to do more around the house and keep up with his grandkids. He recently complained of feeling some chest  tightness and nausea during exericise. He says he feels this at times and it will last a few minutes. He plans to call and report this to his cardiolgoist. Will continue to monitor for progress.   Patient has completed 31 sessions gaining 9 lbs since last 30 day review. His weight continues to fluctuate and he continues to adjust his Lasix dosage accordingly. He continues to do well in the program with progression.  His fasting glucose reading average 150. No recent A1C on file. He continues to say he does feel stronger and has more energy. He had a URI last week that made him week and caused him to  miss 3 sessions. Will continue to monitor for progress.   Patient graduated with 36 sessions gaining 3 lbs overall. His weight did fluctuate throughout the program. He did well in the program. His exit walk test improved by 8.7%. His blood pressure also improved with the walk test both initial and during the walk test by 28 mmhg. He continues to use a cane for ambulation but says his gait and balance have improved in the program. He says he is pleased with his progress in the program and does feel better overall and a lot stronger and has more energy and stamina. He plans on continuing exercising at [facility] [employer]. CR will f/u for one year.     Expected Outcomes  Patient will continue to attend sessions and complete the program meeting his personal goals.   Patient will continue to attend sessions and complete the program meeting his personal goals.   Patient will continue to attend sessions and complete the program meeting his personal goals.   Patient will continue to attend sessions and complete the program meeting his personal goals.   Patient will continue exercising at [facility] and continue to meet his personal goals.       Exercise Goals and Review: Exercise Goals    Row Name 04/30/18 1715             Exercise Goals   Increase Physical Activity  Yes       Intervention  Develop an  individualized exercise prescription for aerobic and resistive training based on initial evaluation findings, risk stratification, comorbidities and participant's personal goals.;Provide advice, education, support and counseling about physical activity/exercise needs.       Expected Outcomes  Short Term: Attend rehab on a regular basis to increase amount of physical activity.;Long Term: Exercising regularly at least 3-5 days a week.       Increase Strength and Stamina  Yes       Intervention  Provide advice, education, support and counseling about physical activity/exercise needs.;Develop an individualized exercise prescription for aerobic and resistive training based on initial evaluation findings, risk stratification, comorbidities and participant's personal goals.       Expected Outcomes  Short Term: Perform resistance training exercises routinely during rehab and add in resistance training at home;Short Term: Increase workloads from initial exercise prescription for resistance, speed, and METs.;Long Term: Improve cardiorespiratory fitness, muscular endurance and strength as measured by increased METs and functional capacity (6MWT)       Able to understand and use rate of perceived exertion (RPE) scale  Yes       Intervention  Provide education and explanation on how to use RPE scale       Expected Outcomes  Short Term: Able to use RPE daily in rehab to express subjective intensity level;Long Term:  Able to use RPE to guide intensity level when exercising independently       Knowledge and understanding of Target Heart Rate Range (THRR)  Yes       Intervention  Provide education and explanation of THRR including how the numbers were predicted and where they are located for reference       Expected Outcomes  Short Term: Able to state/look up THRR;Long Term: Able to use THRR to govern intensity when exercising independently       Able to check pulse independently  Yes       Intervention  Provide  education and demonstration on how to check pulse in carotid and radial arteries.;Review  the importance of being able to check your own pulse for safety during independent exercise       Expected Outcomes  Short Term: Able to explain why pulse checking is important during independent exercise;Long Term: Able to check pulse independently and accurately       Understanding of Exercise Prescription  Yes       Intervention  Provide education, explanation, and written materials on patient's individual exercise prescription       Expected Outcomes  Short Term: Able to explain program exercise prescription;Long Term: Able to explain home exercise prescription to exercise independently          Exercise Goals Re-Evaluation: Exercise Goals Re-Evaluation    Row Name 05/17/18 1503 05/27/18 1032 06/07/18 0724 06/28/18 1416 07/23/18 1450     Exercise Goal Re-Evaluation   Exercise Goals Review  Increase Physical Activity;Increase Strength and Stamina;Able to check pulse independently;Knowledge and understanding of Target Heart Rate Range (THRR)  Increase Physical Activity;Increase Strength and Stamina;Able to check pulse independently;Knowledge and understanding of Target Heart Rate Range (THRR)  Increase Physical Activity;Increase Strength and Stamina;Knowledge and understanding of Target Heart Rate Range (THRR);Able to check pulse independently;Understanding of Exercise Prescription;Able to understand and use rate of perceived exertion (RPE) scale  Increase Physical Activity;Increase Strength and Stamina;Knowledge and understanding of Target Heart Rate Range (THRR);Able to check pulse independently;Understanding of Exercise Prescription;Able to understand and use rate of perceived exertion (RPE) scale  Increase Physical Activity;Increase Strength and Stamina;Knowledge and understanding of Target Heart Rate Range (THRR);Able to check pulse independently;Understanding of Exercise Prescription;Able to understand and  use rate of perceived exertion (RPE) scale   Comments  -  Patient is progressing well in the program and working hard to improve activity. He has increased his overall MET level as well as his dumbbell weight. He was put on a diuretic to aid in losing weight and decrease the amount of fluid.   Patient is progressing well in the program and working hard to improve activity. He has increased his overall MET level as well as his dumbbell weight. He was put on a diuretic to aid in losing weight and decrease the amount of fluid. His weight is holding steady at the same place since given the diuretic.   Patient has continued to work hard in th eprogram and increase his workloads and weights as he is getting stronger. He is maintaining his weight around 283 lbs, although he is still trying to lose weight. He stated tha the exercise has helped him to feel better and able to do more with his grandkids.   Patient has continued to work hard and progress throughout the program. He has increased to 1.8 MPH on the treadmill and level 3 on the NuStep. He is still working to lose weight.    Expected Outcomes  Improve activity and lose 40lbs  Improve activity and lose 40lbs  Improve activity and lose 40lbs  Improve overall activity level and lose 40lbs.   Continue to increase strength and stamina. Increase activity level.       Nutrition & Weight - Outcomes: Pre Biometrics - 04/30/18 1718      Pre Biometrics   Height  '5\' 11"'  (1.803 m)    Weight  128.4 kg    Waist Circumference  53 inches    Hip Circumference  43 inches    Waist to Hip Ratio  1.23 %    BMI (Calculated)  39.49    Triceps Skinfold  20 mm    %  Body Fat  38.4 %    Grip Strength  29.8 kg   kilograms   Flexibility  10.2 in    Single Leg Stand  38 seconds      Post Biometrics - 07/30/18 1149       Post  Biometrics   Height  '5\' 11"'  (1.803 m)    Weight  128.3 kg    Waist Circumference  51.5 inches    Hip Circumference  44 inches    Waist to Hip  Ratio  1.17 %    BMI (Calculated)  39.47    Triceps Skinfold  15 mm    % Body Fat  36.6 %    Grip Strength  31 kg    Flexibility  0 in    Single Leg Stand  21 seconds       Nutrition: Nutrition Therapy & Goals - 05/27/18 1259      Personal Nutrition Goals   Nutrition Goal  For heart healthy choices add >50% of whole grains, make half their plate fruits and vegetables. Discuss the difference between starchy vegetables and leafy greens, and how leafy vegetables provide fiber, helps maintain healthy weight, helps control blood glucose, and lowers cholesterol.  Discuss purchasing fresh or frozen vegetable to reduce sodium and not to add grease, fat or sugar. Consume <18oz of red meat per week. Consume lean cuts of meats and very little of meats high in sodium and nitrates such as pork and lunch meats. Discussed portion control for all food groups.    Additional Goals?  No      Intervention Plan   Intervention  Nutrition handout(s) given to patient.    Expected Outcomes  Short Term Goal: Understand basic principles of dietary content, such as calories, fat, sodium, cholesterol and nutrients.       Nutrition Discharge: Nutrition Assessments - 08/06/18 1502      MEDFICTS Scores   Pre Score  24    Post Score  23    Score Difference  -1       Education Questionnaire Score: Knowledge Questionnaire Score - 08/06/18 1502      Knowledge Questionnaire Score   Pre Score  21/24    Post Score  24/24       Goals reviewed with patient; copy given to patient.

## 2018-10-04 IMAGING — DX DG CHEST 2V
2 series · 2 of 2 positions shown · non-contrast
Comparison: CT 03/31/2017. Chest x-ray 03/31/2017. Chest x-ray
01/22/2017 .

CLINICAL DATA: Chest pain.  Shortness of breath .

EXAM:
CHEST  2 VIEW

[chest pa]
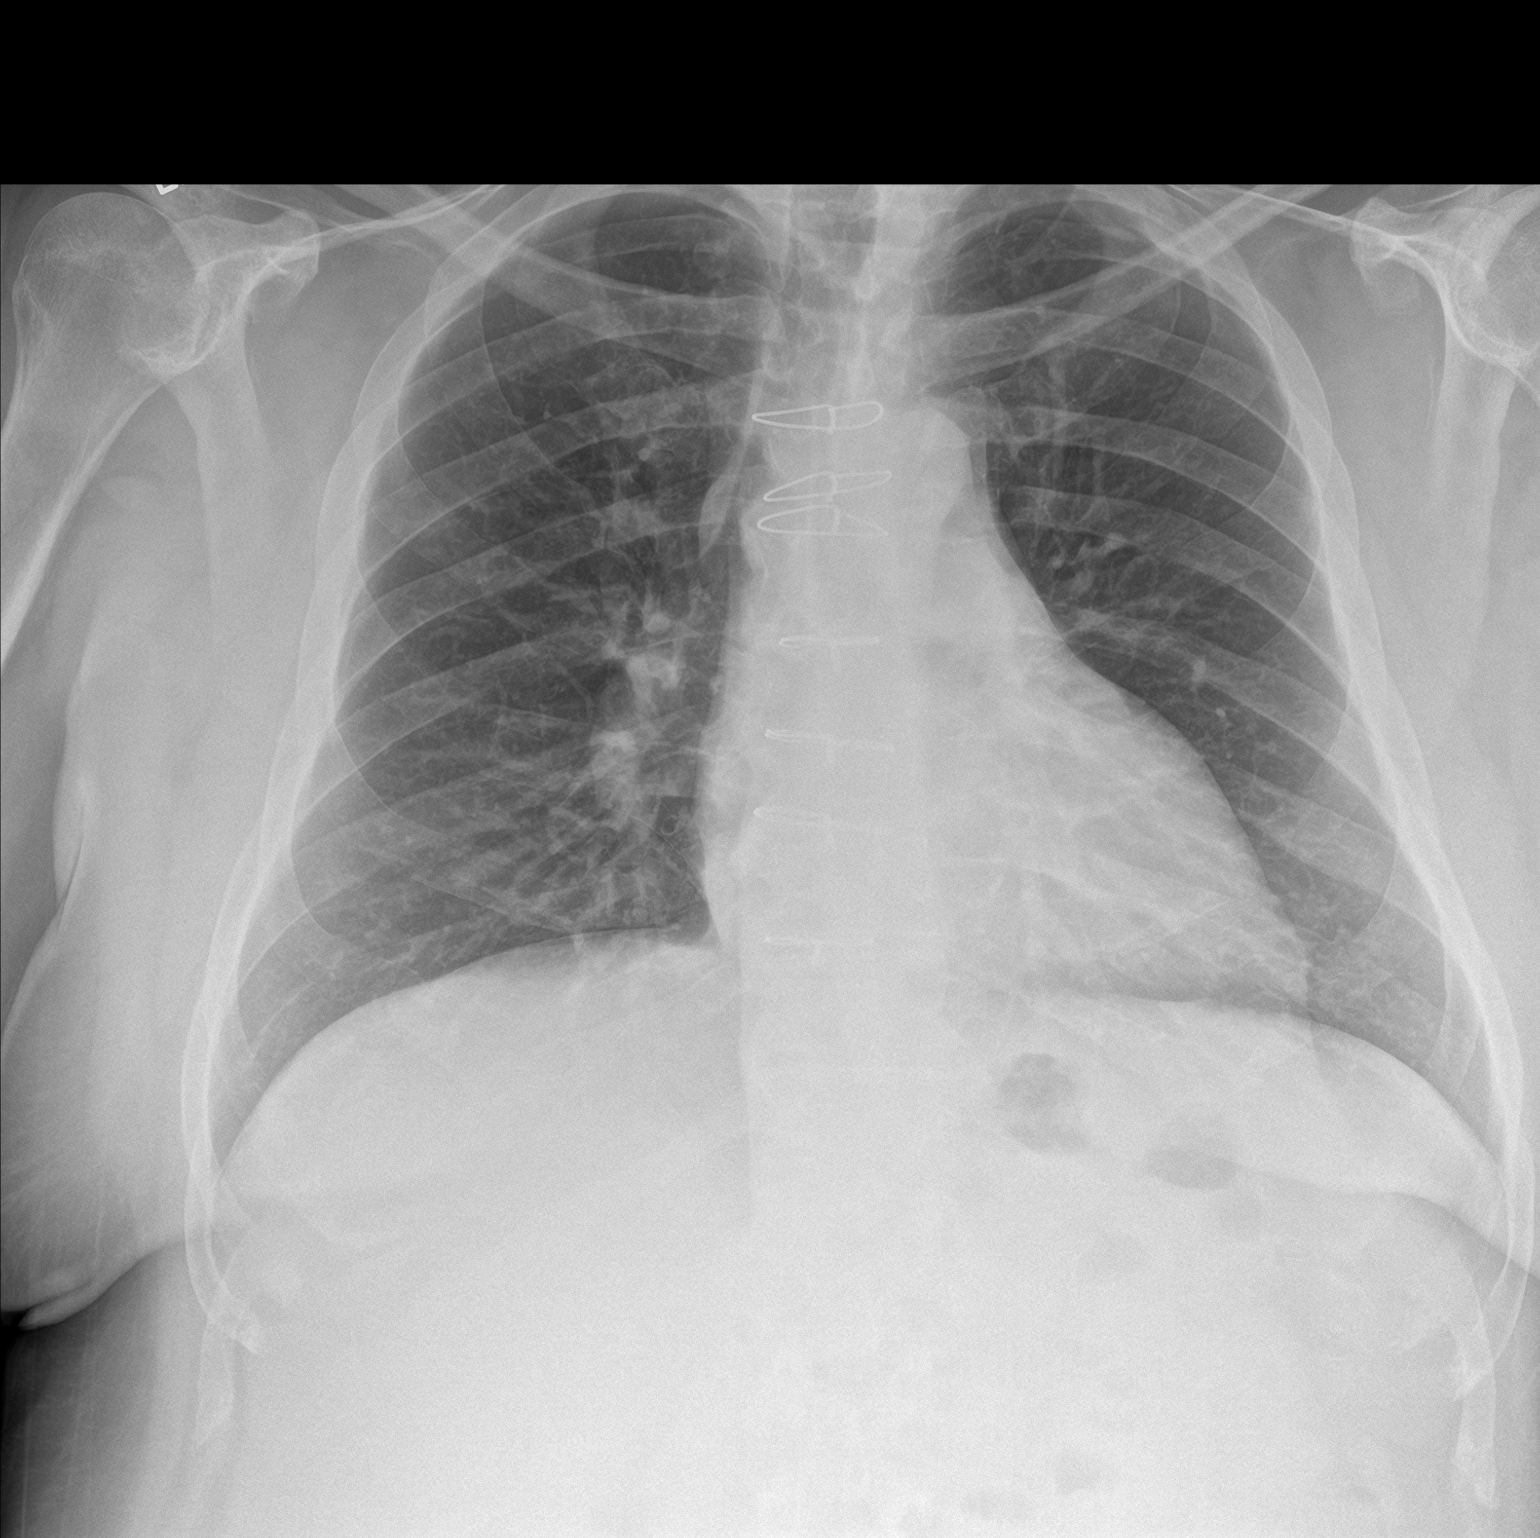

[chest lat]
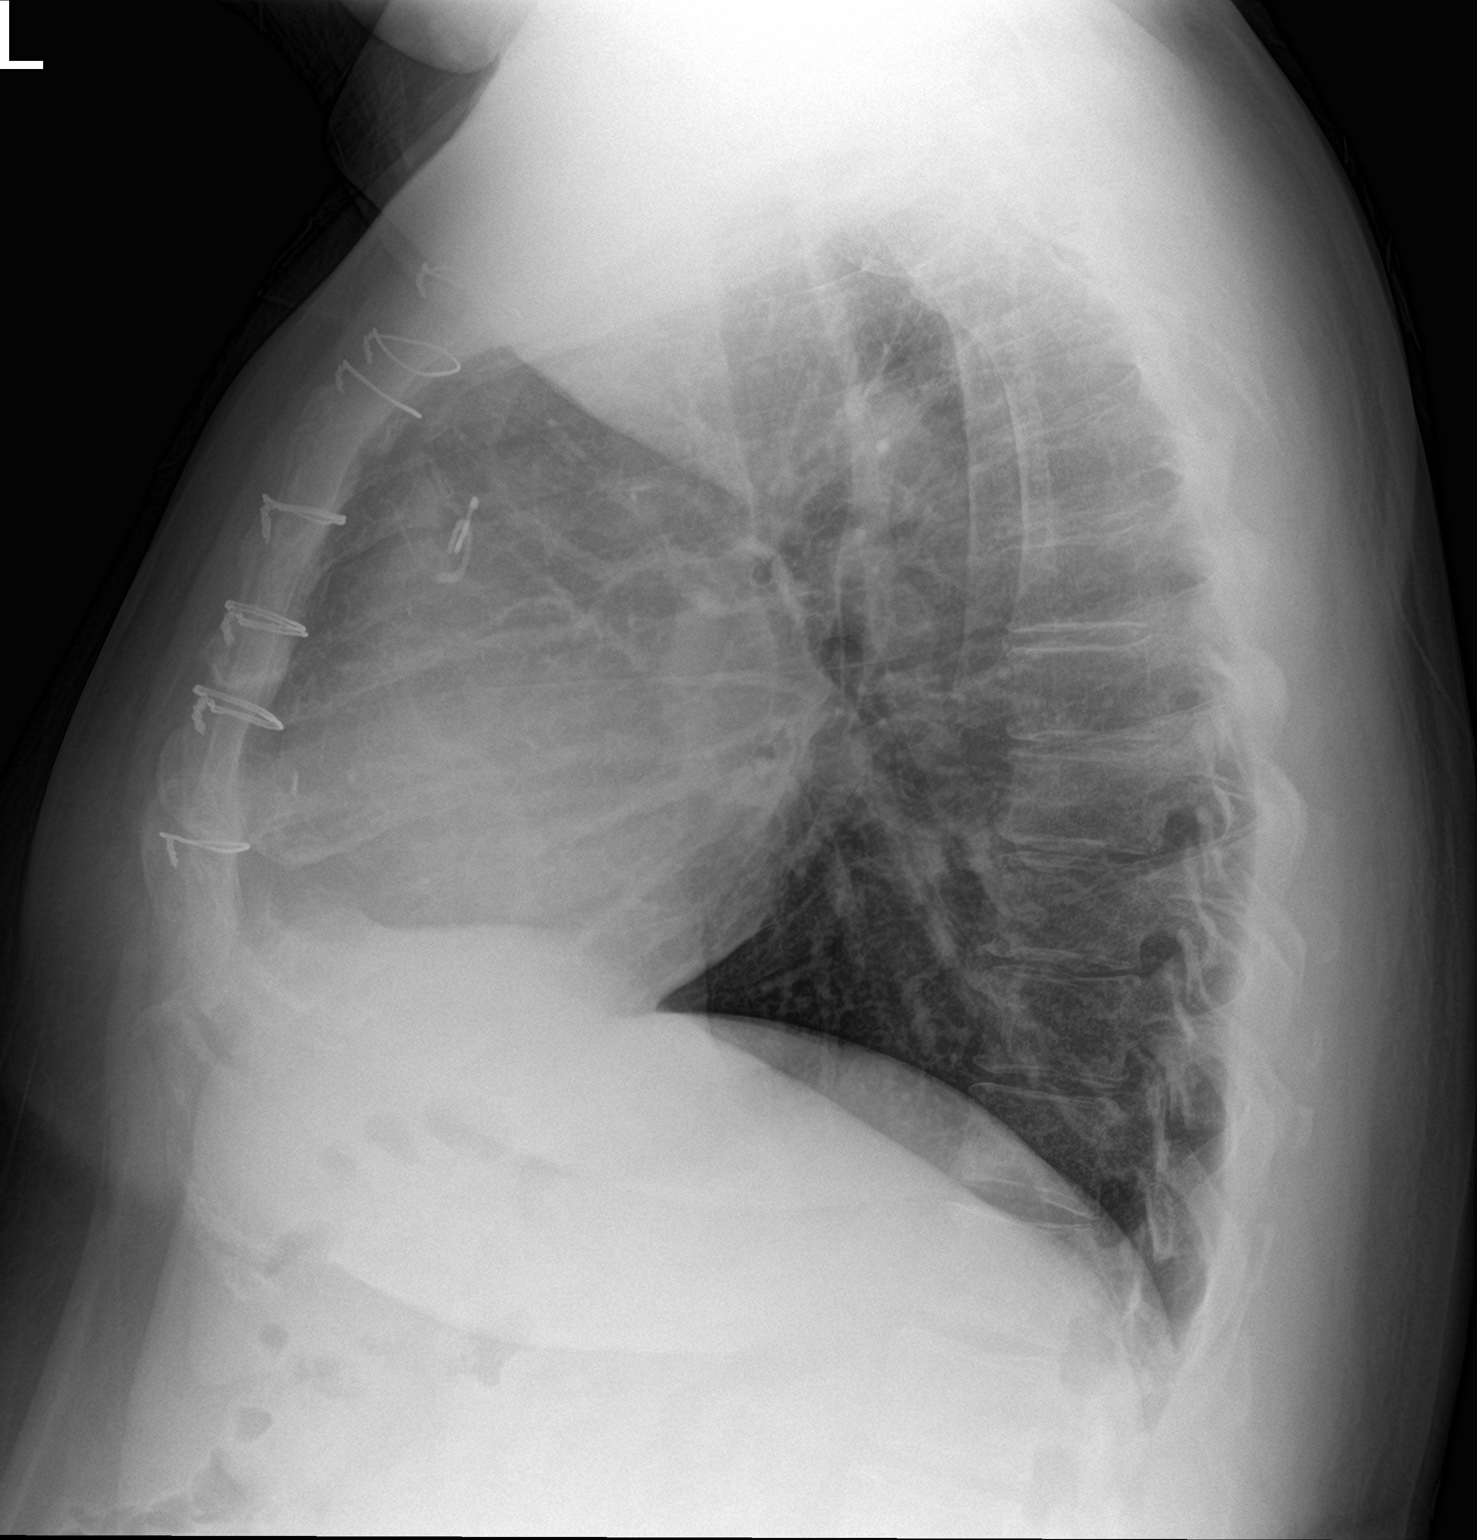

[2 of 2 positions shown; findings below may reference images not displayed]

FINDINGS: Mediastinum and hilar structures normal. Prior median sternotomy and
CAD. Heart size normal. Mild left base subsegmental atelectasis. No
pleural effusion or pneumothorax. Fracture of the left first rib
cannot be excluded. This may be postsurgical. Adjacent pleural
thickening noted. Clinical correlation suggested .
IMPRESSION: 1.  Prior median sternotomy and CABG.

2. Mild left base subsegmental atelectasis .

3. Fractured left first rib cannot be excluded. This may be
postsurgical. Adjacent pleural thickening noted. Clinical
correlation suggested. No pneumothorax.

## 2019-08-29 ENCOUNTER — Other Ambulatory Visit (HOSPITAL_BASED_OUTPATIENT_CLINIC_OR_DEPARTMENT_OTHER): Payer: Self-pay

## 2019-08-29 DIAGNOSIS — G4710 Hypersomnia, unspecified: Secondary | ICD-10-CM

## 2019-08-29 DIAGNOSIS — G4730 Sleep apnea, unspecified: Secondary | ICD-10-CM

## 2019-08-29 DIAGNOSIS — R5383 Other fatigue: Secondary | ICD-10-CM

## 2019-09-07 ENCOUNTER — Other Ambulatory Visit (HOSPITAL_COMMUNITY)
Admission: RE | Admit: 2019-09-07 | Discharge: 2019-09-07 | Disposition: A | Discharge status: Home / Self Care | Payer: Medicaid Other | Source: Ambulatory Visit | Attending: Neurology | Admitting: Neurology

## 2019-09-07 DIAGNOSIS — Z01812 Encounter for preprocedural laboratory examination: Secondary | ICD-10-CM | POA: Diagnosis present

## 2019-09-07 DIAGNOSIS — Z20828 Contact with and (suspected) exposure to other viral communicable diseases: Secondary | ICD-10-CM | POA: Diagnosis not present

## 2019-09-07 LAB — SARS CORONAVIRUS 2 (TAT 6-24 HRS): SARS Coronavirus 2: NEGATIVE

## 2019-09-09 ENCOUNTER — Other Ambulatory Visit: Payer: Self-pay

## 2019-09-09 ENCOUNTER — Ambulatory Visit: Payer: Medicaid Other | Attending: Internal Medicine | Admitting: Neurology

## 2019-09-09 DIAGNOSIS — Z79899 Other long term (current) drug therapy: Secondary | ICD-10-CM | POA: Diagnosis not present

## 2019-09-09 DIAGNOSIS — G4710 Hypersomnia, unspecified: Secondary | ICD-10-CM | POA: Insufficient documentation

## 2019-09-09 DIAGNOSIS — G4730 Sleep apnea, unspecified: Secondary | ICD-10-CM

## 2019-09-09 DIAGNOSIS — G4733 Obstructive sleep apnea (adult) (pediatric): Secondary | ICD-10-CM | POA: Diagnosis not present

## 2019-09-09 DIAGNOSIS — Z7982 Long term (current) use of aspirin: Secondary | ICD-10-CM | POA: Diagnosis not present

## 2019-09-09 DIAGNOSIS — Z794 Long term (current) use of insulin: Secondary | ICD-10-CM | POA: Insufficient documentation

## 2019-09-09 DIAGNOSIS — R5383 Other fatigue: Secondary | ICD-10-CM

## 2019-09-20 NOTE — Procedures (Signed)
[facility] Eusebia Grulke A. Gerilyn Pilgrim, MD     [email or web address]             NOCTURNAL POLYSOMNOGRAPHY   LOCATION: [facility]   Patient Name: Michael Vaughn, Michael Vaughn Date: 09/09/2019 Gender: Male D.O.B: [birth year] Age (years): 55 Referring Provider: Lia Hopping MD Height (inches): 71 Interpreting Physician: Beryle Beams MD, ABSM Weight (lbs): 282 RPSGT: Peak, Robert BMI: 39 MRN: [id number] Neck Size: 18.00 CLINICAL INFORMATION Sleep Study Type: NPSG     Indication for sleep study: Fatigue, Witnesses Apnea / Gasping During Sleep     Epworth Sleepiness Score: 10     SLEEP STUDY TECHNIQUE As per the AASM Manual for the Scoring of Sleep and Associated Events v2.3 (April 2016) with a hypopnea requiring 4% desaturations.  The channels recorded and monitored were frontal, central and occipital EEG, electrooculogram (EOG), submentalis EMG (chin), nasal and oral airflow, thoracic and abdominal wall motion, anterior tibialis EMG, snore microphone, electrocardiogram, and pulse oximetry.  MEDICATIONS Medications self-administered by patient taken the night of the study : N/A  Current Outpatient Medications:  .  allopurinol (ZYLOPRIM) 300 MG tablet, Take 300 mg by mouth daily., Disp: , Rfl:  .  amiodarone (PACERONE) 200 MG tablet, Take 200 mg daily by mouth., Disp: , Rfl:  .  aspirin 81 MG tablet, Take 81 mg by mouth daily. , Disp: , Rfl:  .  furosemide (LASIX) 80 MG tablet, Take 60 mg by mouth daily. , Disp: , Rfl:  .  gabapentin (NEURONTIN) 600 MG tablet, Take 600 mg by mouth 2 (two) times daily. , Disp: , Rfl:  .  glipiZIDE (GLUCOTROL XL) 10 MG 24 hr tablet, Take 10 mg by mouth daily.  , Disp: , Rfl:  .  HYDROcodone-acetaminophen (NORCO/VICODIN) 5-325 MG tablet, Take 1 tablet every 6 (six) hours as needed by mouth for moderate pain., Disp: , Rfl:  .  ibuprofen (ADVIL,MOTRIN) 400 MG tablet, Take 400 mg every 6 (six) hours as needed by mouth., Disp: , Rfl:  .  insulin  glargine (LANTUS) 100 UNIT/ML injection, Inject 14 Units into the skin daily., Disp: , Rfl:  .  insulin glargine (LANTUS) 100 UNIT/ML injection, Inject 10 Units into the skin at bedtime., Disp: , Rfl:  .  lisinopril (PRINIVIL,ZESTRIL) 40 MG tablet, Take 40 mg daily by mouth., Disp: , Rfl:  .  metFORMIN (GLUCOPHAGE) 1000 MG tablet, Take 1,000 mg by mouth 2 (two) times daily with a meal., Disp: , Rfl:  .  metoprolol succinate (TOPROL-XL) 25 MG 24 hr tablet, Take 37.5 mg daily by mouth., Disp: , Rfl:  .  Multiple Vitamin (MULTI-VITAMINS) TABS, Take 1 tablet daily by mouth., Disp: , Rfl:  .  omega-3 acid ethyl esters (LOVAZA) 1 g capsule, Take 1 g daily by mouth., Disp: , Rfl:  .  omeprazole (PRILOSEC) 20 MG capsule, Take 20 mg by mouth daily., Disp: , Rfl:  .  pravastatin (PRAVACHOL) 40 MG tablet, Take 40 mg by mouth daily.  , Disp: , Rfl:  .  ranitidine (ZANTAC) 300 MG tablet, Take 300 mg by mouth 2 (two) times daily., Disp: , Rfl:  .  senna-docusate (SENOKOT-S) 8.6-50 MG tablet, Take 2 tablets at bedtime by mouth., Disp: , Rfl:  .  spironolactone (ALDACTONE) 25 MG tablet, Take 12.5 mg daily by mouth., Disp: , Rfl:  .  tadalafil (CIALIS) 20 MG tablet, Take 1 tablet daily as needed by mouth., Disp: , Rfl: 11 .  tamsulosin (FLOMAX) 0.4 MG CAPS capsule, Take 0.4  mg by mouth daily., Disp: , Rfl:      SLEEP ARCHITECTURE The study was initiated at 10:01:36 PM and ended at 5:09:51 AM.  Sleep onset time was 55.9 minutes and the sleep efficiency was 67.1%%. The total sleep time was 287.5 minutes.  Stage REM latency was 161.0 minutes.  The patient spent 16.2%% of the night in stage N1 sleep, 76.9%% in stage N2 sleep, 0.0%% in stage N3 and 7% in REM.  Alpha intrusion was absent.  Supine sleep was 3.30%.  RESPIRATORY PARAMETERS The overall apnea/hypopnea index (AHI) was 45.7 per hour. There were 43 total apneas, including 1 obstructive, 42 central and 0 mixed apneas. There were 176 hypopneas and 18  RERAs.  The AHI during Stage REM sleep was 45.0 per hour.  AHI while supine was 94.9 per hour.  The mean oxygen saturation was 94.8%. The minimum SpO2 during sleep was 83.0%.  loud snoring was noted during this study.  CARDIAC DATA The 2 lead EKG demonstrated sinus rhythm. The mean heart rate was 67.8 beats per minute. Other EKG findings include: None.  LEG MOVEMENT DATA The total PLMS were 0 with a resulting PLMS index of 0.0. Associated arousal with leg movement index was 0.0.  IMPRESSIONS 1. Severe obstructive sleep apnea syndrome is documented with this recording. AutoPAP 8-15 is recommended. 2. Abnormal sleep architecture with the reduced sleep efficiency and absent slow-wave sleep is also noted.   Delano Metz, MD Diplomate, American Board of Sleep Medicine.  ELECTRONICALLY SIGNED ON:  09/20/2019, 7:31 AM [facility] PH: [phone]   FX: [phone] [place]

## 2022-02-13 ENCOUNTER — Encounter (HOSPITAL_COMMUNITY): Payer: Self-pay

## 2022-02-13 ENCOUNTER — Encounter (HOSPITAL_COMMUNITY)
Admission: RE | Admit: 2022-02-13 | Discharge: 2022-02-13 | Disposition: A | Discharge status: Home / Self Care | Payer: Medicaid Other | Source: Ambulatory Visit | Attending: Cardiology | Admitting: Cardiology

## 2022-02-13 VITALS — BP 112/60 | HR 76 | Ht 71.0 in | Wt 276.2 lb

## 2022-02-13 DIAGNOSIS — I5022 Chronic systolic (congestive) heart failure: Secondary | ICD-10-CM | POA: Diagnosis present

## 2022-02-13 NOTE — Progress Notes (Signed)
Cardiac Individual Treatment Plan  Patient Details  Name: Michael Vaughn MRN: [id number] Date of Birth: 22-Aug-1961 Referring Provider:   [name] from 02/13/2022 in [facility] CARDIAC REHABILITATION  Referring Provider Dr. Clydene Pugh       Initial Encounter Date:  [name] from 02/13/2022 in [place] CARDIAC REHABILITATION  Date 02/13/22       Visit Diagnosis: Chronic systolic heart failure (HCC)  Patient's Home Medications on Admission:  Current Outpatient Medications:    allopurinol (ZYLOPRIM) 300 MG tablet, Take 300 mg by mouth daily., Disp: , Rfl:    aspirin 325 MG tablet, Take 325 mg by mouth daily., Disp: , Rfl:    finasteride (PROSCAR) 5 MG tablet, Take 5 mg by mouth daily., Disp: , Rfl:    fluticasone (FLONASE) 50 MCG/ACT nasal spray, Place 1 spray into both nostrils daily as needed for allergies., Disp: , Rfl:    glipiZIDE (GLUCOTROL XL) 10 MG 24 hr tablet, Take 10 mg by mouth daily.  , Disp: , Rfl:    HYDROcodone-acetaminophen (NORCO/VICODIN) 5-325 MG tablet, Take 1 tablet by mouth every 12 (twelve) hours as needed for moderate pain., Disp: , Rfl:    insulin glargine (LANTUS) 100 UNIT/ML injection, Inject 24 Units into the skin daily., Disp: , Rfl:    magnesium oxide (MAG-OX) 400 MG tablet, Take 400 mg by mouth daily., Disp: , Rfl:    meclizine (ANTIVERT) 25 MG tablet, Take 1 tablet by mouth 3 (three) times daily as needed for dizziness., Disp: , Rfl:    metFORMIN (GLUCOPHAGE) 1000 MG tablet, Take 1,000 mg by mouth 2 (two) times daily with a meal., Disp: , Rfl:    metoprolol succinate (TOPROL-XL) 50 MG 24 hr tablet, Take 50 mg by mouth daily., Disp: , Rfl:    Multiple Vitamin (MULTI-VITAMINS) TABS, Take 1 tablet daily by mouth., Disp: , Rfl:    nitroGLYCERIN (NITROSTAT) 0.4 MG SL tablet, Place 0.4 mg under the tongue., Disp: , Rfl:    omega-3 acid ethyl esters (LOVAZA) 1 g capsule, Take 1 g daily  by mouth., Disp: , Rfl:    Polyethyl Glycol-Propyl Glycol (SYSTANE) 0.4-0.3 % SOLN, Place 1 application. into both eyes daily as needed (Dry eyes)., Disp: , Rfl:    rosuvastatin (CRESTOR) 40 MG tablet, Take 40 mg by mouth daily., Disp: , Rfl:    sacubitril-valsartan (ENTRESTO) 24-26 MG, Take 1 tablet by mouth 2 (two) times daily., Disp: , Rfl:    senna-docusate (SENOKOT-S) 8.6-50 MG tablet, Take 2 tablets by mouth 2 (two) times daily as needed for mild constipation or moderate constipation., Disp: , Rfl:    spironolactone (ALDACTONE) 25 MG tablet, Take 12.5 mg daily by mouth., Disp: , Rfl:    tadalafil (CIALIS) 5 MG tablet, Take 5 mg by mouth daily., Disp: , Rfl:    tamsulosin (FLOMAX) 0.4 MG CAPS capsule, Take 0.4 mg by mouth daily., Disp: , Rfl:    torsemide (DEMADEX) 20 MG tablet, Take 60 mg by mouth daily., Disp: , Rfl:   Past Medical History: Past Medical History:  Diagnosis Date   Anginal pain (HCC)    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   Diabetes mellitus    Dyslipidemia    Dysrhythmia    Ejection fraction    Improved from the past  /  ejection fraction 50%, echo, July, 2012, hypokinesis at the base of the inferior wall.   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        Capillary Blood Glucose: Lab Results  Component Value Date   GLUCAP 174 (H) 06/28/2018   GLUCAP 136 (H) 06/18/2018   GLUCAP 194 (H) 08/13/2017   GLUCAP 203 (H) 08/13/2017   GLUCAP 184 (H) 08/05/2017    POCT Glucose     Row Name 02/13/22 0818             POCT Blood Glucose   Pre-Exercise 144 mg/dL                Exercise Target Goals: Exercise Program Goal: Individual exercise prescription set using results from initial 6 min walk test and THRR while considering  patient's activity barriers and safety.   Exercise Prescription Goal: Starting with aerobic activity 30 plus minutes a day, 3 days per week for initial exercise prescription. Provide home exercise prescription and guidelines that participant acknowledges understanding prior to discharge.  Activity Barriers & Risk Stratification:  Activity Barriers & Cardiac Risk Stratification - 02/13/22 0857       Activity Barriers & Cardiac Risk Stratification   Activity Barriers Back Problems;Joint Problems;Deconditioning;Decreased Ventricular Function    Cardiac Risk Stratification High             6 Minute Walk:  6 Minute Walk     Row Name 02/13/22 0922         6 Minute Walk   Phase Initial     Distance 1400 feet     Walk Time 6 minutes     # of Rest Breaks 0     MPH 2.65     METS 2.84     RPE 11     VO2 Peak 9.94     Symptoms No     Resting HR 76 bpm      Resting BP 110/62     Resting Oxygen Saturation  98 %     Exercise Oxygen Saturation  during 6 min  walk 98 %     Max Ex. HR 95 bpm     Max Ex. BP 120/60     2 Minute Post BP 116/60              Oxygen Initial Assessment:   Oxygen Re-Evaluation:   Oxygen Discharge (Final Oxygen Re-Evaluation):   Initial Exercise Prescription:  Initial Exercise Prescription - 02/13/22 0900       Date of Initial Exercise RX and Referring Provider   Date 02/13/22    Referring Provider Dr. Clydene Pugh    Expected Discharge Date 05/12/22      Treadmill   MPH 1.6    Grade 0    Minutes 17      NuStep   Level 1    SPM 70    Minutes 22      Prescription Details   Frequency (times per week) 3    Duration Progress to 30 minutes of continuous aerobic without signs/symptoms of physical distress      Intensity   THRR 40-80% of Max Heartrate 64-128    Ratings of Perceived Exertion 11-13      Resistance Training   Training Prescription Yes    Weight 4    Reps 10-15             Perform Capillary Blood Glucose checks as needed.  Exercise Prescription Changes:   Exercise Comments:   Exercise Goals and Review:   Exercise Goals     Row Name 02/13/22 0925             Exercise Goals   Increase Physical Activity Yes       Intervention Develop an individualized exercise prescription for aerobic and resistive training based on initial evaluation findings, risk stratification, comorbidities and participant's personal goals.;Provide advice, education, support and counseling about physical activity/exercise needs.       Expected Outcomes Short Term: Attend rehab on a regular basis to increase amount of physical activity.;Long Term: Exercising regularly at least 3-5 days a week.;Long Term: Add in home exercise to make exercise part of routine and to increase amount of physical activity.       Increase Strength and Stamina Yes       Intervention Provide advice, education, support and  counseling about physical activity/exercise needs.;Develop an individualized exercise prescription for aerobic and resistive training based on initial evaluation findings, risk stratification, comorbidities and participant's personal goals.       Expected Outcomes Short Term: Perform resistance training exercises routinely during rehab and add in resistance training at home;Short Term: Increase workloads from initial exercise prescription for resistance, speed, and METs.;Long Term: Improve cardiorespiratory fitness, muscular endurance and strength as measured by increased METs and functional capacity ( )       Able to understand and use rate of perceived exertion (RPE) scale Yes       Intervention Provide education and explanation on how to use RPE scale       Expected Outcomes Short Term: Able to use RPE daily in rehab to express subjective intensity level;Long Term:  Able to use RPE to guide intensity level when exercising independently       Knowledge and understanding of Target Heart Rate Range (THRR) Yes       Intervention Provide education and explanation of THRR including how the numbers were predicted and where they are located for reference       Expected Outcomes Short Term: Able to state/look up THRR;Long Term: Able to use  THRR to govern intensity when exercising independently;Short Term: Able to use daily as guideline for intensity in rehab       Able to check pulse independently Yes       Intervention Provide education and demonstration on how to check pulse in carotid and radial arteries.;Review the importance of being able to check your own pulse for safety during independent exercise       Expected Outcomes Short Term: Able to explain why pulse checking is important during independent exercise;Long Term: Able to check pulse independently and accurately       Understanding of Exercise Prescription Yes       Intervention Provide education, explanation, and written materials on patient's  individual exercise prescription       Expected Outcomes Short Term: Able to explain program exercise prescription;Long Term: Able to explain home exercise prescription to exercise independently                Exercise Goals Re-Evaluation :    Discharge Exercise Prescription (Final Exercise Prescription Changes):   Nutrition:  Target Goals: Understanding of nutrition guidelines, daily intake of sodium 1500mg , cholesterol 200mg , calories 30% from fat and 7% or less from saturated fats, daily to have 5 or more servings of fruits and vegetables.  Biometrics:  Pre Biometrics - 02/13/22 0927       Pre Biometrics   Height 5\' 11"  (1.803 m)    Weight 276 lb 3.8 oz (125.3 kg)    Waist Circumference 52 inches    Hip Circumference 46 inches    Waist to Hip Ratio 1.13 %    BMI (Calculated) 38.54    Triceps Skinfold 15 mm    % Body Fat 36.6 %    Grip Strength 36.9 kg    Flexibility 0 in    Single Leg Stand 23 seconds              Nutrition Therapy Plan and Nutrition Goals:  Nutrition Therapy & Goals - 02/13/22 0830       Intervention Plan   Intervention Nutrition handout(s) given to patient.    Expected Outcomes Short Term Goal: Understand basic principles of dietary content, such as calories, fat, sodium, cholesterol and nutrients.             Nutrition Assessments:  Nutrition Assessments - 02/13/22 0831       MEDFICTS Scores   Pre Score 24            MEDIFICTS Score Key: ?70 Need to make dietary changes  40-70 Heart Healthy Diet ? 40 Therapeutic Level Cholesterol Diet   Picture Your Plate Scores: 02/15/22 Unhealthy dietary pattern with much room for improvement. 41-50 Dietary pattern unlikely to meet recommendations for good health and room for improvement. 51-60 More healthful dietary pattern, with some room for improvement.  >60 Healthy dietary pattern, although there may be some specific behaviors that could be improved.    Nutrition Goals  Re-Evaluation:   Nutrition Goals Discharge (Final Nutrition Goals Re-Evaluation):   Psychosocial: Target Goals: Acknowledge presence or absence of significant depression and/or stress, maximize coping skills, provide positive support system. Participant is able to verbalize types and ability to use techniques and skills needed for reducing stress and depression.  Initial Review & Psychosocial Screening:  Initial Psych Review & Screening - 02/13/22 0851       Initial Review   Current issues with None Identified      Family Dynamics   Good Support System? Yes  Comments His support system is his sister.      Barriers   Psychosocial barriers to participate in program There are no identifiable barriers or psychosocial needs.      Screening Interventions   Interventions Encouraged to exercise    Expected Outcomes Long Term goal: The participant improves quality of Life and PHQ9 Scores as seen by post scores and/or verbalization of changes;Short Term goal: Identification and review with participant of any Quality of Life or Depression concerns found by scoring the questionnaire.             Quality of Life Scores:  Quality of Life - 02/13/22 0927       Quality of Life   Select Quality of Life      Quality of Life Scores   Health/Function Pre 18.4 %    Socioeconomic Pre 20.57 %    Psych/Spiritual Pre 24.86 %    Family Pre 22.8 %    GLOBAL Pre 20.82 %            Scores of 19 and below usually indicate a poorer quality of life in these areas.  A difference of  2-3 points is a clinically meaningful difference.  A difference of 2-3 points in the total score of the Quality of Life Index has been associated with significant improvement in overall quality of life, self-image, physical symptoms, and general health in studies assessing change in quality of life.  PHQ-9: Review Flowsheet        02/13/2022 08/06/2018  Depression screen PHQ 2/9  Decreased Interest 0 0  Down,  Depressed, Hopeless 0 0  PHQ - 2 Score 0 0  Altered sleeping 1 1  Tired, decreased energy 3 1  Change in appetite 0 0  Feeling bad or failure about yourself  0 0  Trouble concentrating 0 0  Moving slowly or fidgety/restless 0 0  Suicidal thoughts 0   PHQ-9 Score 4 2  Difficult doing work/chores Somewhat difficult          Interpretation of Total Score  Total Score Depression Severity:  1-4 = Minimal depression, 5-9 = Mild depression, 10-14 = Moderate depression, 15-19 = Moderately severe depression, 20-27 = Severe depression   Psychosocial Evaluation and Intervention:  Psychosocial Evaluation - 02/13/22 0918       Psychosocial Evaluation & Interventions   Interventions Encouraged to exercise with the program and follow exercise prescription    Comments Pt has no barriers to paricipate in CR. He has no identifiable psychosocial issues. He scored a 4 on his PHQ-9 and he relates this to his heart condition. He feels like he never has any energy. This has worsened over the past few months. He was previously exercising multiple days per week at the [facility], but then he began having problems with fluid retention. This has since been taken care of, but [facility]. He reports that his sister is his main support system. He is seperated from his wife, but this was not something that he brought up or spoke about at all. His goals while in the program are to lose weight and to regain his strength and stamina. He has a positive outlook and is eager to start the program.    Expected Outcomes Pt will continue to have no identifiable psychosocial issues.    Continue Psychosocial Services  No Follow up required             Psychosocial Re-Evaluation:  Psychosocial Discharge (Final Psychosocial Re-Evaluation):   Vocational Rehabilitation: Provide vocational rehab assistance to qualifying candidates.   Vocational Rehab Evaluation & Intervention:  Vocational Rehab -  02/13/22 0829       Initial Vocational Rehab Evaluation & Intervention   Assessment shows need for Vocational Rehabilitation No      Vocational Rehab Re-Evaulation   Comments He is disabled and not going back to work.             Education: Education Goals: Education classes will be provided on a weekly basis, covering required topics. Participant will state understanding/return demonstration of topics presented.  Learning Barriers/Preferences:  Learning Barriers/Preferences - 02/13/22 9604       Learning Barriers/Preferences   Learning Barriers None    Learning Preferences Group Instruction;Individual Instruction;Skilled Demonstration;Written Material             Education Topics: Hypertension, Hypertension Reduction -Define heart disease and high blood pressure. Discus how high blood pressure affects the body and ways to reduce high blood pressure. Flowsheet Row CARDIAC REHAB PHASE II EXERCISE from 08/04/2018 in [place] CARDIAC REHABILITATION  Date 07/28/18  Educator D. Coad  Instruction Review Code 2- Demonstrated Understanding       Exercise and Your Heart -Discuss why it is important to exercise, the FITT principles of exercise, normal and abnormal responses to exercise, and how to exercise safely. Flowsheet Row CARDIAC REHAB PHASE II EXERCISE from 08/04/2018 in [place] CARDIAC REHABILITATION  Date 05/05/18  Educator D. Coad  Instruction Review Code 2- Demonstrated Understanding       Angina -Discuss definition of angina, causes of angina, treatment of angina, and how to decrease risk of having angina.   Cardiac Medications -Review what the following cardiac medications are used for, how they affect the body, and side effects that may occur when taking the medications.  Medications include Aspirin, Beta blockers, calcium channel blockers, ACE Inhibitors, angiotensin receptor blockers, diuretics, digoxin, and antihyperlipidemics. Flowsheet Row  CARDIAC REHAB PHASE II EXERCISE from 08/04/2018 in [place] CARDIAC REHABILITATION  Date 05/19/18  Educator Timoteo Expose  Instruction Review Code 2- Demonstrated Understanding       Congestive Heart Failure -Discuss the definition of CHF, how to live with CHF, the signs and symptoms of CHF, and how keep track of weight and sodium intake. Flowsheet Row CARDIAC REHAB PHASE II EXERCISE from 08/04/2018 in [place] CARDIAC REHABILITATION  Date 05/26/18  Educator D. Coad   Instruction Review Code 2- Demonstrated Understanding       Heart Disease and Intimacy -Discus the effect sexual activity has on the heart, how changes occur during intimacy as we age, and safety during sexual activity. Flowsheet Row CARDIAC REHAB PHASE II EXERCISE from 08/04/2018 in [place] CARDIAC REHABILITATION  Date 06/02/18  Educator Timoteo Expose  Instruction Review Code 2- Demonstrated Understanding       Smoking Cessation / COPD -Discuss different methods to quit smoking, the health benefits of quitting smoking, and the definition of COPD. Flowsheet Row CARDIAC REHAB PHASE II EXERCISE from 08/04/2018 in [place] CARDIAC REHABILITATION  Date 06/09/18  Educator Timoteo Expose  Instruction Review Code 2- Demonstrated Understanding       Nutrition I: Fats -Discuss the types of cholesterol, what cholesterol does to the heart, and how cholesterol levels can be controlled. Flowsheet Row CARDIAC REHAB PHASE II EXERCISE from 08/04/2018 in [place] CARDIAC REHABILITATION  Date 06/16/18  Educator D. Coad  Instruction Review Code 2- Demonstrated Understanding  Nutrition II: Labels -Discuss the different components of food labels and how to read food label Flowsheet Row CARDIAC REHAB PHASE II EXERCISE from 08/04/2018 in [place] CARDIAC REHABILITATION  Date 06/23/18  Educator Timoteo Expose  Instruction Review Code 2- Demonstrated Understanding       Heart Parts/Heart Disease and PAD -Discuss  the anatomy of the heart, the pathway of blood circulation through the heart, and these are affected by heart disease. Flowsheet Row CARDIAC REHAB PHASE II EXERCISE from 08/04/2018 in [place] CARDIAC REHABILITATION  Date 06/30/18  Educator Timoteo Expose  Instruction Review Code 2- Demonstrated Understanding       Stress I: Signs and Symptoms -Discuss the causes of stress, how stress may lead to anxiety and depression, and ways to limit stress. Flowsheet Row CARDIAC REHAB PHASE II EXERCISE from 08/04/2018 in [place] CARDIAC REHABILITATION  Date 07/07/18  Educator D. Coad  Instruction Review Code 2- Demonstrated Understanding       Stress II: Relaxation -Discuss different types of relaxation techniques to limit stress. Flowsheet Row CARDIAC REHAB PHASE II EXERCISE from 08/04/2018 in [place] CARDIAC REHABILITATION  Date 07/14/18  Educator D. Coad   Instruction Review Code 2- Demonstrated Understanding       Warning Signs of Stroke / TIA -Discuss definition of a stroke, what the signs and symptoms are of a stroke, and how to identify when someone is having stroke.   Knowledge Questionnaire Score:  Knowledge Questionnaire Score - 02/13/22 0853       Knowledge Questionnaire Score   Pre Score 20/24             Core Components/Risk Factors/Patient Goals at Admission:  Personal Goals and Risk Factors at Admission - 02/13/22 0855       Core Components/Risk Factors/Patient Goals on Admission    Weight Management Yes;Obesity;Weight Loss    Intervention Weight Management: Develop a combined nutrition and exercise program designed to reach desired caloric intake, while maintaining appropriate intake of nutrient and fiber, sodium and fats, and appropriate energy expenditure required for the weight goal.;Weight Management: Provide education and appropriate resources to help participant work on and attain dietary goals.;Weight Management/Obesity: Establish reasonable short  term and long term weight goals.;Obesity: Provide education and appropriate resources to help participant work on and attain dietary goals.    Expected Outcomes Short Term: Continue to assess and modify interventions until short term weight is achieved;Long Term: Adherence to nutrition and physical activity/exercise program aimed toward attainment of established weight goal;Weight Maintenance: Understanding of the daily nutrition guidelines, which includes 25-35% calories from fat, 7% or less cal from saturated fats, less than 200mg  cholesterol, less than 1.5gm of sodium, & 5 or more servings of fruits and vegetables daily;Weight Loss: Understanding of general recommendations for a balanced deficit meal plan, which promotes 1-2 lb weight loss per week and includes a negative energy balance of [phone] kcal/d;Understanding recommendations for meals to include 15-35% energy as protein, 25-35% energy from fat, 35-60% energy from carbohydrates, less than 200mg  of dietary cholesterol, 20-35 gm of total fiber daily;Understanding of distribution of calorie intake throughout the day with the consumption of 4-5 meals/snacks    Diabetes Yes    Intervention Provide education about signs/symptoms and action to take for hypo/hyperglycemia.;Provide education about proper nutrition, including hydration, and aerobic/resistive exercise prescription along with prescribed medications to achieve blood glucose in normal ranges: Fasting glucose 65-99 mg/dL    Expected Outcomes Short Term: Participant verbalizes understanding of the signs/symptoms and immediate  care of hyper/hypoglycemia, proper foot care and importance of medication, aerobic/resistive exercise and nutrition plan for blood glucose control.;Long Term: Attainment of HbA1C < 7%.    Heart Failure Yes    Intervention Provide a combined exercise and nutrition program that is supplemented with education, support and counseling about heart failure. Directed toward relieving  symptoms such as shortness of breath, decreased exercise tolerance, and extremity edema.    Expected Outcomes Improve functional capacity of life;Short term: Attendance in program 2-3 days a week with increased exercise capacity. Reported lower sodium intake. Reported increased fruit and vegetable intake. Reports medication compliance.;Short term: Daily weights obtained and reported for increase. Utilizing diuretic protocols set by physician.;Long term: Adoption of self-care skills and reduction of barriers for early signs and symptoms recognition and intervention leading to self-care maintenance.    Hypertension Yes    Intervention Provide education on lifestyle modifcations including regular physical activity/exercise, weight management, moderate sodium restriction and increased consumption of fresh fruit, vegetables, and low fat dairy, alcohol moderation, and smoking cessation.;Monitor prescription use compliance.    Expected Outcomes Short Term: Continued assessment and intervention until BP is < 140/77mm HG in hypertensive participants. < 130/87mm HG in hypertensive participants with diabetes, heart failure or chronic kidney disease.;Long Term: Maintenance of blood pressure at goal levels.    Personal Goal Other Yes    Personal Goal Increase strength and stamina    Intervention Attend CR 3 days per week and exercise at home 2-4 days per week    Expected Outcomes Pt will report improvement in his strength and stamina and his six minute walk test distance will increase.             Core Components/Risk Factors/Patient Goals Review:    Core Components/Risk Factors/Patient Goals at Discharge (Final Review):    ITP Comments:   Comments: Patient arrived for 1st visit/orientation/education at 0800. Patient was referred to CR by Dr. Malissa Hippo due to chronic systolic heart failure (I50.22). During orientation advised patient on arrival and appointment times what to wear, what to do before, during  and after exercise. Reviewed attendance and class policy.  Pt is scheduled to return Cardiac Rehab on 02/19/2022 at 0930. Pt was advised to come to class 15 minutes before class starts.  Discussed RPE/Dpysnea scales. Patient participated in warm up stretches. Patient was able to complete 6 minute walk test.  Telemetry: NSR with first degree AV block and frequent PVC's. Patient was measured for the equipment. Discussed equipment safety with patient. Took patient pre-anthropometric measurements. Patient finished visit at 0900.

## 2022-02-19 ENCOUNTER — Encounter (HOSPITAL_COMMUNITY)
Admission: RE | Admit: 2022-02-19 | Discharge: 2022-02-19 | Disposition: A | Discharge status: Home / Self Care | Payer: Medicaid Other | Source: Ambulatory Visit | Attending: Cardiology | Admitting: Cardiology

## 2022-02-19 DIAGNOSIS — I5022 Chronic systolic (congestive) heart failure: Secondary | ICD-10-CM

## 2022-02-19 NOTE — Progress Notes (Signed)
Daily Session Note  Patient Details  Name: Michael Vaughn MRN: [id number] Date of Birth: 12/19/1960 Referring Provider:   [name] from 02/13/2022 in [facility]  Referring Provider Dr. Kalkar       Encounter Date: 02/19/2022  Check In:  Session Check In - 02/19/22 0930       Check-In   Supervising physician immediately available to respond to emergencies [facility] MD immediately available    Physician(s) Dr Branch    Location [facility]    Staff Present Daphyne Martin, RN, BSN;Dalton Fletcher, MS, ACSM-CEP, Exercise Physiologist    Virtual Visit No    Medication changes reported     No    Fall or balance concerns reported    No    Tobacco Cessation No Change    Warm-up and Cool-down Performed as group-led instruction    Resistance Training Performed Yes    VAD Patient? No    PAD/SET Patient? No      Pain Assessment   Currently in Pain? No/denies             Capillary Blood Glucose: No results found for this or any previous visit (from the past 24 hour(s)).    Social History   Tobacco Use  Smoking Status Former   Packs/day: 1.00   Years: 5.00   Pack years: 5.00   Types: Cigarettes   Quit date: 09/22/1989   Years since quitting: 32.4  Smokeless Tobacco Former   Types: Snuff   Quit date: 09/22/1989  Tobacco Comments   dipped snuff for one year    Goals Met:  Independence with exercise equipment Exercise tolerated well No report of concerns or symptoms today Strength training completed today  Goals Unmet:  Not Applicable  Comments: Checkout at 1030.   Dr. Jonathan Branch is Medical Director for  Cardiac Rehab 

## 2022-02-21 ENCOUNTER — Encounter (HOSPITAL_COMMUNITY)
Admission: RE | Admit: 2022-02-21 | Discharge: 2022-02-21 | Disposition: A | Discharge status: Home / Self Care | Payer: Medicaid Other | Source: Ambulatory Visit | Attending: Cardiology | Admitting: Cardiology

## 2022-02-21 DIAGNOSIS — I5022 Chronic systolic (congestive) heart failure: Secondary | ICD-10-CM | POA: Insufficient documentation

## 2022-02-21 NOTE — Progress Notes (Signed)
Daily Session Note  Patient Details  Name: Michael Vaughn MRN: [id number] Date of Birth: 1961-02-17 Referring Provider:   [name] from 02/13/2022 in [place]  Referring Provider Dr. Ula Lingo       Encounter Date: 02/21/2022  Check In:  Session Check In - 02/21/22 0930       Check-In   Supervising physician immediately available to respond to emergencies [facility] immediately available    Physician(s) Dr Gardiner Rhyme    Location [facility]    Staff Present Leana Springston Hassell Done, RN, Bjorn Loser, MS, ACSM-CEP, Exercise Physiologist;Heather Zigmund Daniel, Exercise Physiologist    Virtual Visit No    Medication changes reported     No    Fall or balance concerns reported    No    Tobacco Cessation No Change    Warm-up and Cool-down Performed as group-led instruction    Resistance Training Performed Yes    VAD Patient? No    PAD/SET Patient? No      Pain Assessment   Currently in Pain? No/denies    Multiple Pain Sites No             Capillary Blood Glucose: No results found for this or any previous visit (from the past 24 hour(s)).    Social History   Tobacco Use  Smoking Status Former   Packs/day: 1.00   Years: 5.00   Pack years: 5.00   Types: Cigarettes   Quit date: 09/22/1989   Years since quitting: 32.4  Smokeless Tobacco Former   Types: Snuff   Quit date: 09/22/1989  Tobacco Comments   dipped snuff for one year    Goals Met:  Independence with exercise equipment Exercise tolerated well No report of concerns or symptoms today Strength training completed today  Goals Unmet:  Not Applicable  Comments: Checkout at 1030.   Dr. Carlyle Dolly is Medical Director for [facility] Card[facility] Re[facility]

## 2022-02-24 ENCOUNTER — Encounter (HOSPITAL_COMMUNITY)
Admission: RE | Admit: 2022-02-24 | Discharge: 2022-02-24 | Disposition: A | Discharge status: Home / Self Care | Payer: Medicaid Other | Source: Ambulatory Visit | Attending: Cardiology | Admitting: Cardiology

## 2022-02-24 DIAGNOSIS — I5022 Chronic systolic (congestive) heart failure: Secondary | ICD-10-CM

## 2022-02-24 NOTE — Progress Notes (Signed)
Daily Session Note  Patient Details  Name: Michael Vaughn MRN: [id number] Date of Birth: 1961/07/23 Referring Provider:   [name] from 02/13/2022 in [place]  Referring Provider Dr. Ula Lingo       Encounter Date: 02/24/2022  Check In:  Session Check In - 02/24/22 0930       Check-In   Supervising physician immediately available to respond to emergencies [facility] immediately available    Physician(s) Dr. Marlou Porch    Location [facility]    Staff Present Redge Gainer, BS, Exercise Physiologist;Debra Wynetta Emery, RN, Bjorn Loser, MS, ACSM-CEP, Exercise Physiologist    Virtual Visit No    Medication changes reported     No    Fall or balance concerns reported    No    Tobacco Cessation No Change    Warm-up and Cool-down Performed as group-led instruction    Resistance Training Performed Yes    VAD Patient? No    PAD/SET Patient? No      Pain Assessment   Currently in Pain? No/denies    Multiple Pain Sites No             Capillary Blood Glucose: No results found for this or any previous visit (from the past 24 hour(s)).    Social History   Tobacco Use  Smoking Status Former   Packs/day: 1.00   Years: 5.00   Pack years: 5.00   Types: Cigarettes   Quit date: 09/22/1989   Years since quitting: 32.4  Smokeless Tobacco Former   Types: Snuff   Quit date: 09/22/1989  Tobacco Comments   dipped snuff for one year    Goals Met:  Independence with exercise equipment Exercise tolerated well No report of concerns or symptoms today Strength training completed today  Goals Unmet:  Not Applicable  Comments: checkout time is 1030   Dr. Carlyle Dolly is Medical Director for [facility]

## 2022-02-26 ENCOUNTER — Encounter (HOSPITAL_COMMUNITY): Payer: Medicaid Other

## 2022-02-28 ENCOUNTER — Encounter (HOSPITAL_COMMUNITY)
Admission: RE | Admit: 2022-02-28 | Discharge: 2022-02-28 | Disposition: A | Discharge status: Home / Self Care | Payer: Medicaid Other | Source: Ambulatory Visit | Attending: Cardiology | Admitting: Cardiology

## 2022-02-28 DIAGNOSIS — I5022 Chronic systolic (congestive) heart failure: Secondary | ICD-10-CM | POA: Diagnosis not present

## 2022-02-28 NOTE — Progress Notes (Signed)
Daily Session Note  Patient Details  Name: Michael Vaughn MRN: [id number] Date of Birth: January 15, 1961 Referring Provider:   [name] from 02/13/2022 in [place]  Referring Provider Dr. Ula Lingo       Encounter Date: 02/28/2022  Check In:  Session Check In - 02/28/22 0930       Check-In   Supervising physician immediately available to respond to emergencies [facility] immediately available    Physician(s) Dr. Domenic Polite    [facility]    Staff Present Redge Gainer, BS, Exercise Physiologist;Shadiyah Wernli Wynetta Emery, RN, Bjorn Loser, MS, ACSM-CEP, Exercise Physiologist    Virtual Visit No    Medication changes reported     No    Fall or balance concerns reported    No    Tobacco Cessation No Change    Warm-up and Cool-down Performed as group-led instruction    Resistance Training Performed Yes    VAD Patient? No    PAD/SET Patient? No      Pain Assessment   Currently in Pain? No/denies    Multiple Pain Sites No             Capillary Blood Glucose: No results found for this or any previous visit (from the past 24 hour(s)).    Social History   Tobacco Use  Smoking Status Former   Packs/day: 1.00   Years: 5.00   Total pack years: 5.00   Types: Cigarettes   Quit date: 09/22/1989   Years since quitting: 32.4  Smokeless Tobacco Former   Types: Snuff   Quit date: 09/22/1989  Tobacco Comments   dipped snuff for one year    Goals Met:  Independence with exercise equipment Exercise tolerated well No report of concerns or symptoms today Strength training completed today  Goals Unmet:  Not Applicable  Comments: Check out 1030.   Dr. Carlyle Dolly is Medical Director for [facility] Card[facility]

## 2022-03-03 ENCOUNTER — Encounter (HOSPITAL_COMMUNITY)
Admission: RE | Admit: 2022-03-03 | Discharge: 2022-03-03 | Disposition: A | Discharge status: Home / Self Care | Payer: Medicaid Other | Source: Ambulatory Visit | Attending: Cardiology | Admitting: Cardiology

## 2022-03-03 DIAGNOSIS — I5022 Chronic systolic (congestive) heart failure: Secondary | ICD-10-CM | POA: Diagnosis not present

## 2022-03-03 NOTE — Progress Notes (Signed)
Daily Session Note  Patient Details  Name: Michael Vaughn MRN: [id number] Date of Birth: Mar 12, 1961 Referring Provider:   [name] from 02/13/2022 in [place]  Referring Provider Dr. Ula Lingo       Encounter Date: 03/03/2022  Check In:  Session Check In - 03/03/22 0930       Check-In   Supervising physician immediately available to respond to emergencies [facility] immediately available    Physician(s) Dr. Debara Pickett    [facility]    Staff Present Hoy Register, MS, ACSM-CEP, Exercise Physiologist;Heather Zigmund Daniel, Exercise Physiologist    Virtual Visit No    Medication changes reported     No    Fall or balance concerns reported    No    Tobacco Cessation No Change    Warm-up and Cool-down Performed as group-led instruction    Resistance Training Performed Yes    VAD Patient? No    PAD/SET Patient? No      Pain Assessment   Currently in Pain? No/denies    Multiple Pain Sites No             Capillary Blood Glucose: No results found for this or any previous visit (from the past 24 hour(s)).    Social History   Tobacco Use  Smoking Status Former   Packs/day: 1.00   Years: 5.00   Total pack years: 5.00   Types: Cigarettes   Quit date: 09/22/1989   Years since quitting: 32.4  Smokeless Tobacco Former   Types: Snuff   Quit date: 09/22/1989  Tobacco Comments   dipped snuff for one year    Goals Met:  Independence with exercise equipment Exercise tolerated well No report of concerns or symptoms today Strength training completed today  Goals Unmet:  Not Applicable  Comments: checkout time is 1030   Dr. Carlyle Dolly is Medical Director for [place]

## 2022-03-05 ENCOUNTER — Encounter (HOSPITAL_COMMUNITY)
Admission: RE | Admit: 2022-03-05 | Discharge: 2022-03-05 | Disposition: A | Discharge status: Home / Self Care | Payer: Medicaid Other | Source: Ambulatory Visit | Attending: Cardiology | Admitting: Cardiology

## 2022-03-05 DIAGNOSIS — I5022 Chronic systolic (congestive) heart failure: Secondary | ICD-10-CM

## 2022-03-05 NOTE — Progress Notes (Signed)
Daily Session Note  Patient Details  Name: Michael Vaughn MRN: [id number] Date of Birth: Jan 28, 1961 Referring Provider:   [name] from 02/13/2022 in [place]  Referring Provider Dr. Ula Lingo       Encounter Date: 03/05/2022  Check In:  Session Check In - 03/05/22 0930       Check-In   Supervising physician immediately available to respond to emergencies [facility] immediately available    Physician(s) Dr. Radford Pax    Location [facility]    Staff Present Redge Gainer, BS, Exercise Physiologist;Daphyne Hassell Done, RN, Bjorn Loser, MS, ACSM-CEP, Exercise Physiologist    Virtual Visit No    Medication changes reported     No    Fall or balance concerns reported    No    Tobacco Cessation No Change    Warm-up and Cool-down Performed as group-led instruction    Resistance Training Performed Yes    VAD Patient? No    PAD/SET Patient? No      Pain Assessment   Currently in Pain? No/denies    Multiple Pain Sites No             Capillary Blood Glucose: No results found for this or any previous visit (from the past 24 hour(s)).    Social History   Tobacco Use  Smoking Status Former   Packs/day: 1.00   Years: 5.00   Total pack years: 5.00   Types: Cigarettes   Quit date: 09/22/1989   Years since quitting: 32.4  Smokeless Tobacco Former   Types: Snuff   Quit date: 09/22/1989  Tobacco Comments   dipped snuff for one year    Goals Met:  Independence with exercise equipment Exercise tolerated well No report of concerns or symptoms today Strength training completed today  Goals Unmet:  Not Applicable  Comments: check out 1030   Dr. Carlyle Dolly is Medical Director for [place]

## 2022-03-07 ENCOUNTER — Encounter (HOSPITAL_COMMUNITY)
Admission: RE | Admit: 2022-03-07 | Discharge: 2022-03-07 | Disposition: A | Discharge status: Home / Self Care | Payer: Medicaid Other | Source: Ambulatory Visit | Attending: Cardiology | Admitting: Cardiology

## 2022-03-07 DIAGNOSIS — I5022 Chronic systolic (congestive) heart failure: Secondary | ICD-10-CM | POA: Diagnosis not present

## 2022-03-07 NOTE — Progress Notes (Signed)
Daily Session Note  Patient Details  Name: Michael Vaughn MRN: [id number] Date of Birth: 1960-11-06 Referring Provider:   [name] from 02/13/2022 in [place]  Referring Provider Dr. Ula Lingo       Encounter Date: 03/07/2022  Check In:  Session Check In - 03/07/22 0930       Check-In   Supervising physician immediately available to respond to emergencies [facility] immediately available    Physician(s) Dr. Harrington Challenger    Location [facility]    Staff Present Hoy Register, MS, ACSM-CEP, Exercise Physiologist;Heather Zigmund Daniel, Exercise Physiologist    Virtual Visit No    Medication changes reported     No    Fall or balance concerns reported    No    Tobacco Cessation No Change    Warm-up and Cool-down Performed as group-led instruction    Resistance Training Performed Yes    VAD Patient? No    PAD/SET Patient? No      Pain Assessment   Currently in Pain? No/denies    Multiple Pain Sites No             Capillary Blood Glucose: No results found for this or any previous visit (from the past 24 hour(s)).    Social History   Tobacco Use  Smoking Status Former   Packs/day: 1.00   Years: 5.00   Total pack years: 5.00   Types: Cigarettes   Quit date: 09/22/1989   Years since quitting: 32.4  Smokeless Tobacco Former   Types: Snuff   Quit date: 09/22/1989  Tobacco Comments   dipped snuff for one year    Goals Met:  Independence with exercise equipment Exercise tolerated well No report of concerns or symptoms today Strength training completed today  Goals Unmet:  Not Applicable  Comments: checkout time Is 1030   Dr. Carlyle Dolly is Medical Director for [place]

## 2022-03-10 ENCOUNTER — Encounter (HOSPITAL_COMMUNITY)
Admission: RE | Admit: 2022-03-10 | Discharge: 2022-03-10 | Disposition: A | Discharge status: Home / Self Care | Payer: Medicaid Other | Source: Ambulatory Visit | Attending: Cardiology | Admitting: Cardiology

## 2022-03-10 VITALS — Wt 277.6 lb

## 2022-03-10 DIAGNOSIS — I5022 Chronic systolic (congestive) heart failure: Secondary | ICD-10-CM | POA: Diagnosis not present

## 2022-03-10 NOTE — Progress Notes (Signed)
Daily Session Note  Patient Details  Name: Michael Vaughn MRN: [id number] Date of Birth: [birth year] Referring Provider:   [name] from 02/13/2022 in [place]  Referring Provider Dr. Ula Lingo       Encounter Date: 03/10/2022  Check In:  Session Check In - 03/10/22 0930       Check-In   Supervising physician immediately available to respond to emergencies [facility] MD immediately available    Physician(s) Dr. Johney Frame    Location [facility]    Staff Present Hoy Register, MS, ACSM-CEP, Exercise Physiologist;Samreen Seltzer Zigmund Daniel, Exercise Physiologist;Daphyne Hassell Done, RN, BSN    Virtual Visit No    Medication changes reported     No    Fall or balance concerns reported    No    Tobacco Cessation No Change    Warm-up and Cool-down Performed as group-led instruction    Resistance Training Performed Yes    VAD Patient? No    PAD/SET Patient? No      Pain Assessment   Currently in Pain? No/denies    Multiple Pain Sites No             Capillary Blood Glucose: No results found for this or any previous visit (from the past 24 hour(s)).    Social History   Tobacco Use  Smoking Status Former   Packs/day: 1.00   Years: 5.00   Total pack years: 5.00   Types: Cigarettes   Quit date: 09/22/1989   Years since quitting: 32.4  Smokeless Tobacco Former   Types: Snuff   Quit date: 09/22/1989  Tobacco Comments   dipped snuff for one year    Goals Met:  Independence with exercise equipment Exercise tolerated well No report of concerns or symptoms today Strength training completed today  Goals Unmet:  Not Applicable  Comments: check out 1030   Dr. Carlyle Dolly is Medical Director for [place]

## 2022-03-12 ENCOUNTER — Encounter (HOSPITAL_COMMUNITY)
Admission: RE | Admit: 2022-03-12 | Discharge: 2022-03-12 | Disposition: A | Discharge status: Home / Self Care | Payer: Medicaid Other | Source: Ambulatory Visit | Attending: Cardiology | Admitting: Cardiology

## 2022-03-12 DIAGNOSIS — I5022 Chronic systolic (congestive) heart failure: Secondary | ICD-10-CM

## 2022-03-12 NOTE — Progress Notes (Signed)
Cardiac Individual Treatment Plan  Patient Details  Name: Michael Vaughn MRN: [id number] Date of Birth: 22-Aug-1961 Referring Provider:   [name] from 02/13/2022 in [facility] CARDIAC REHABILITATION  Referring Provider Dr. Clydene Pugh       Initial Encounter Date:  [name] from 02/13/2022 in [place] CARDIAC REHABILITATION  Date 02/13/22       Visit Diagnosis: Chronic systolic heart failure (HCC)  Patient's Home Medications on Admission:  Current Outpatient Medications:    allopurinol (ZYLOPRIM) 300 MG tablet, Take 300 mg by mouth daily., Disp: , Rfl:    aspirin 325 MG tablet, Take 325 mg by mouth daily., Disp: , Rfl:    finasteride (PROSCAR) 5 MG tablet, Take 5 mg by mouth daily., Disp: , Rfl:    fluticasone (FLONASE) 50 MCG/ACT nasal spray, Place 1 spray into both nostrils daily as needed for allergies., Disp: , Rfl:    glipiZIDE (GLUCOTROL XL) 10 MG 24 hr tablet, Take 10 mg by mouth daily.  , Disp: , Rfl:    HYDROcodone-acetaminophen (NORCO/VICODIN) 5-325 MG tablet, Take 1 tablet by mouth every 12 (twelve) hours as needed for moderate pain., Disp: , Rfl:    insulin glargine (LANTUS) 100 UNIT/ML injection, Inject 24 Units into the skin daily., Disp: , Rfl:    magnesium oxide (MAG-OX) 400 MG tablet, Take 400 mg by mouth daily., Disp: , Rfl:    meclizine (ANTIVERT) 25 MG tablet, Take 1 tablet by mouth 3 (three) times daily as needed for dizziness., Disp: , Rfl:    metFORMIN (GLUCOPHAGE) 1000 MG tablet, Take 1,000 mg by mouth 2 (two) times daily with a meal., Disp: , Rfl:    metoprolol succinate (TOPROL-XL) 50 MG 24 hr tablet, Take 50 mg by mouth daily., Disp: , Rfl:    Multiple Vitamin (MULTI-VITAMINS) TABS, Take 1 tablet daily by mouth., Disp: , Rfl:    nitroGLYCERIN (NITROSTAT) 0.4 MG SL tablet, Place 0.4 mg under the tongue., Disp: , Rfl:    omega-3 acid ethyl esters (LOVAZA) 1 g capsule, Take 1 g daily  by mouth., Disp: , Rfl:    Polyethyl Glycol-Propyl Glycol (SYSTANE) 0.4-0.3 % SOLN, Place 1 application. into both eyes daily as needed (Dry eyes)., Disp: , Rfl:    rosuvastatin (CRESTOR) 40 MG tablet, Take 40 mg by mouth daily., Disp: , Rfl:    sacubitril-valsartan (ENTRESTO) 24-26 MG, Take 1 tablet by mouth 2 (two) times daily., Disp: , Rfl:    senna-docusate (SENOKOT-S) 8.6-50 MG tablet, Take 2 tablets by mouth 2 (two) times daily as needed for mild constipation or moderate constipation., Disp: , Rfl:    spironolactone (ALDACTONE) 25 MG tablet, Take 12.5 mg daily by mouth., Disp: , Rfl:    tadalafil (CIALIS) 5 MG tablet, Take 5 mg by mouth daily., Disp: , Rfl:    tamsulosin (FLOMAX) 0.4 MG CAPS capsule, Take 0.4 mg by mouth daily., Disp: , Rfl:    torsemide (DEMADEX) 20 MG tablet, Take 60 mg by mouth daily., Disp: , Rfl:   Past Medical History: Past Medical History:  Diagnosis Date   Anginal pain (HCC)    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   Diabetes mellitus    Dyslipidemia    Dysrhythmia    Ejection fraction    Improved from the past  /  ejection fraction 50%, echo, July, 2012, hypokinesis at the base of the inferior wall.   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         Capillary Blood Glucose: Lab Results  Component Value Date   GLUCAP 174 (H) 06/28/2018   GLUCAP 136 (H) 06/18/2018   GLUCAP 194 (H) 08/13/2017   GLUCAP 203 (H) 08/13/2017   GLUCAP 184 (H) 08/05/2017    POCT Glucose     Row Name 02/13/22 0818             POCT Blood Glucose   Pre-Exercise 144 mg/dL                Exercise Target Goals: Exercise Program Goal: Individual exercise prescription set using results from initial 6 min walk test and THRR while considering  patient's activity barriers and safety.   Exercise Prescription Goal: Starting with aerobic activity 30 plus minutes a day, 3 days per week for initial exercise prescription. Provide home exercise prescription and guidelines that participant acknowledges understanding prior to discharge.  Activity Barriers & Risk Stratification:  Activity Barriers & Cardiac Risk Stratification - 02/13/22 0857       Activity Barriers & Cardiac Risk Stratification   Activity Barriers Back Problems;Joint Problems;Deconditioning;Decreased Ventricular Function    Cardiac Risk Stratification High             6 Minute Walk:  6 Minute Walk     Row Name 02/13/22 0922         6 Minute Walk   Phase Initial     Distance 1400 feet     Walk Time 6 minutes     # of Rest Breaks 0     MPH 2.65     METS 2.84     RPE 11     VO2 Peak 9.94     Symptoms No      Resting HR 76 bpm     Resting BP 110/62     Resting Oxygen Saturation  98 %     Exercise Oxygen Saturation  during  6 min walk 98 %     Max Ex. HR 95 bpm     Max Ex. BP 120/60     2 Minute Post BP 116/60              Oxygen Initial Assessment:   Oxygen Re-Evaluation:   Oxygen Discharge (Final Oxygen Re-Evaluation):   Initial Exercise Prescription:  Initial Exercise Prescription - 02/13/22 0900       Date of Initial Exercise RX and Referring Provider   Date 02/13/22    Referring Provider Dr. Clydene Pugh    Expected Discharge Date 05/12/22      Treadmill   MPH 1.6    Grade 0    Minutes 17      NuStep   Level 1    SPM 70    Minutes 22      Prescription Details   Frequency (times per week) 3    Duration Progress to 30 minutes of continuous aerobic without signs/symptoms of physical distress      Intensity   THRR 40-80% of Max Heartrate 64-128    Ratings of Perceived Exertion 11-13      Resistance Training   Training Prescription Yes    Weight 4    Reps 10-15             Perform Capillary Blood Glucose checks as needed.  Exercise Prescription Changes:   Exercise Prescription Changes     Row Name 02/24/22 1500 03/10/22 1100           Response to Exercise   Blood Pressure (Admit) 118/70 112/70      Blood Pressure (Exercise) 128/62 126/62      Blood Pressure (Exit) 112/60 100/72      Heart Rate (Admit) 93 bpm 82 bpm      Heart Rate (Exercise) 107 bpm 124 bpm      Heart Rate (Exit) 102 bpm 104 bpm      Rating of Perceived Exertion (Exercise) 12 12      Duration Continue with 30 min of aerobic exercise without signs/symptoms of physical distress. Continue with 30 min of aerobic exercise without signs/symptoms of physical distress.      Intensity THRR unchanged THRR unchanged        Progression   Progression Continue to progress workloads to maintain intensity without signs/symptoms of physical distress. Continue to progress workloads to maintain  intensity without signs/symptoms of physical distress.        Resistance Training   Training Prescription Yes Yes      Weight 4 5      Reps 10-15 10-15      Time 10 Minutes 10 Minutes        Treadmill   MPH 1.8 2.2      Grade 0 0      Minutes 17 22      METs 2.38 2.69        NuStep   Level 1 2      SPM 117 126      Minutes 22 17      METs 1.88 2.54               Exercise Comments:   Exercise Goals and Review:   Exercise Goals     Row Name 02/13/22 0925 03/10/22 1102           Exercise Goals   Increase Physical Activity Yes Yes      Intervention Develop an individualized exercise prescription for  aerobic and resistive training based on initial evaluation findings, risk stratification, comorbidities and participant's personal goals.;Provide advice, education, support and counseling about physical activity/exercise needs. Develop an individualized exercise prescription for aerobic and resistive training based on initial evaluation findings, risk stratification, comorbidities and participant's personal goals.;Provide advice, education, support and counseling about physical activity/exercise needs.      Expected Outcomes Short Term: Attend rehab on a regular basis to increase amount of physical activity.;Long Term: Exercising regularly at least 3-5 days a week.;Long Term: Add in home exercise to make exercise part of routine and to increase amount of physical activity. Short Term: Attend rehab on a regular basis to increase amount of physical activity.;Long Term: Exercising regularly at least 3-5 days a week.;Long Term: Add in home exercise to make exercise part of routine and to increase amount of physical activity.      Increase Strength and Stamina Yes Yes      Intervention Provide advice, education, support and counseling about physical activity/exercise needs.;Develop an individualized exercise prescription for aerobic and resistive training based on initial evaluation  findings, risk stratification, comorbidities and participant's personal goals. Provide advice, education, support and counseling about physical activity/exercise needs.;Develop an individualized exercise prescription for aerobic and resistive training based on initial evaluation findings, risk stratification, comorbidities and participant's personal goals.      Expected Outcomes Short Term: Perform resistance training exercises routinely during rehab and add in resistance training at home;Short Term: Increase workloads from initial exercise prescription for resistance, speed, and METs.;Long Term: Improve cardiorespiratory fitness, muscular endurance and strength as measured by increased METs and functional capacity ( ) Short Term: Perform resistance training exercises routinely during rehab and add in resistance training at home;Short Term: Increase workloads from initial exercise prescription for resistance, speed, and METs.;Long Term: Improve cardiorespiratory fitness, muscular endurance and strength as measured by increased METs and functional capacity ( )      Able to understand and use rate of perceived exertion (RPE) scale Yes Yes      Intervention Provide education and explanation on how to use RPE scale Provide education and explanation on how to use RPE scale      Expected Outcomes Short Term: Able to use RPE daily in rehab to express subjective intensity level;Long Term:  Able to use RPE to guide intensity level when exercising independently Short Term: Able to use RPE daily in rehab to express subjective intensity level;Long Term:  Able to use RPE to guide intensity level when exercising independently      Knowledge and understanding of Target Heart Rate Range (THRR) Yes Yes      Intervention Provide education and explanation of THRR including how the numbers were predicted and where they are located for reference Provide education and explanation of THRR including how the numbers were  predicted and where they are located for reference      Expected Outcomes Short Term: Able to state/look up THRR;Long Term: Able to use THRR to govern intensity when exercising independently;Short Term: Able to use daily as guideline for intensity in rehab Short Term: Able to state/look up THRR;Long Term: Able to use THRR to govern intensity when exercising independently;Short Term: Able to use daily as guideline for intensity in rehab      Able to check pulse independently Yes Yes      Intervention Provide education and demonstration on how to check pulse in carotid and radial arteries.;Review the importance of being able to check your own pulse for safety during independent exercise  Provide education and demonstration on how to check pulse in carotid and radial arteries.;Review the importance of being able to check your own pulse for safety during independent exercise      Expected Outcomes Short Term: Able to explain why pulse checking is important during independent exercise;Long Term: Able to check pulse independently and accurately Short Term: Able to explain why pulse checking is important during independent exercise;Long Term: Able to check pulse independently and accurately      Understanding of Exercise Prescription Yes Yes      Intervention Provide education, explanation, and written materials on patient's individual exercise prescription Provide education, explanation, and written materials on patient's individual exercise prescription      Expected Outcomes Short Term: Able to explain program exercise prescription;Long Term: Able to explain home exercise prescription to exercise independently Short Term: Able to explain program exercise prescription;Long Term: Able to explain home exercise prescription to exercise independently               Exercise Goals Re-Evaluation :  Exercise Goals Re-Evaluation     Row Name 03/10/22 1103             Exercise Goal Re-Evaluation   Exercise  Goals Review Increase Physical Activity;Increase Strength and Stamina;Able to understand and use rate of perceived exertion (RPE) scale;Knowledge and understanding of Target Heart Rate Range (THRR);Able to check pulse independently;Understanding of Exercise Prescription       Comments Pt has completed 9 sessions of CR. He is motivated to improve himself and he has been able to progress his workloads. He is currently exercising at 2.69 METs on the treadmill. Will continue to monitor and progress as able.       Expected Outcomes Through exercise at rehab and at home, pt will meet their stated goals.                 Discharge Exercise Prescription (Final Exercise Prescription Changes):  Exercise Prescription Changes - 03/10/22 1100       Response to Exercise   Blood Pressure (Admit) 112/70    Blood Pressure (Exercise) 126/62    Blood Pressure (Exit) 100/72    Heart Rate (Admit) 82 bpm    Heart Rate (Exercise) 124 bpm    Heart Rate (Exit) 104 bpm    Rating of Perceived Exertion (Exercise) 12    Duration Continue with 30 min of aerobic exercise without signs/symptoms of physical distress.    Intensity THRR unchanged      Progression   Progression Continue to progress workloads to maintain intensity without signs/symptoms of physical distress.      Resistance Training   Training Prescription Yes    Weight 5    Reps 10-15    Time 10 Minutes      Treadmill   MPH 2.2    Grade 0    Minutes 22    METs 2.69      NuStep   Level 2    SPM 126    Minutes 17    METs 2.54             Nutrition:  Target Goals: Understanding of nutrition guidelines, daily intake of sodium 1500mg , cholesterol 200mg , calories 30% from fat and 7% or less from saturated fats, daily to have 5 or more servings of fruits and vegetables.  Biometrics:  Pre Biometrics - 02/13/22 0927       Pre Biometrics   Height 5\' 11"  (1.803 m)    Weight 125.3  kg    Waist Circumference 52 inches    Hip  Circumference 46 inches    Waist to Hip Ratio 1.13 %    BMI (Calculated) 38.54    Triceps Skinfold 15 mm    % Body Fat 36.6 %    Grip Strength 36.9 kg    Flexibility 0 in    Single Leg Stand 23 seconds              Nutrition Therapy Plan and Nutrition Goals:  Nutrition Therapy & Goals - 03/03/22 0913       Personal Nutrition Goals   Comments Patient scored 24 on his diet assessment. We offer 2 educational sessions on heart healthy nutrition with handouts and assistance with RD referral if patient is interested.      Intervention Plan   Intervention Nutrition handout(s) given to patient.    Expected Outcomes Short Term Goal: Understand basic principles of dietary content, such as calories, fat, sodium, cholesterol and nutrients.             Nutrition Assessments:  Nutrition Assessments - 02/13/22 0831       MEDFICTS Scores   Pre Score 24            MEDIFICTS Score Key: ?70 Need to make dietary changes  40-70 Heart Healthy Diet ? 40 Therapeutic Level Cholesterol Diet   Picture Your Plate Scores: <16 Unhealthy dietary pattern with much room for improvement. 41-50 Dietary pattern unlikely to meet recommendations for good health and room for improvement. 51-60 More healthful dietary pattern, with some room for improvement.  >60 Healthy dietary pattern, although there may be some specific behaviors that could be improved.    Nutrition Goals Re-Evaluation:   Nutrition Goals Discharge (Final Nutrition Goals Re-Evaluation):   Psychosocial: Target Goals: Acknowledge presence or absence of significant depression and/or stress, maximize coping skills, provide positive support system. Participant is able to verbalize types and ability to use techniques and skills needed for reducing stress and depression.  Initial Review & Psychosocial Screening:  Initial Psych Review & Screening - 02/13/22 0851       Initial Review   Current issues with None Identified       Family Dynamics   Good Support System? Yes    Comments His support system is his sister.      Barriers   Psychosocial barriers to participate in program There are no identifiable barriers or psychosocial needs.      Screening Interventions   Interventions Encouraged to exercise    Expected Outcomes Long Term goal: The participant improves quality of Life and PHQ9 Scores as seen by post scores and/or verbalization of changes;Short Term goal: Identification and review with participant of any Quality of Life or Depression concerns found by scoring the questionnaire.             Quality of Life Scores:  Quality of Life - 02/13/22 0927       Quality of Life   Select Quality of Life      Quality of Life Scores   Health/Function Pre 18.4 %    Socioeconomic Pre 20.57 %    Psych/Spiritual Pre 24.86 %    Family Pre 22.8 %    GLOBAL Pre 20.82 %            Scores of 19 and below usually indicate a poorer quality of life in these areas.  A difference of  2-3 points is a clinically meaningful difference.  A  difference of 2-3 points in the total score of the Quality of Life Index has been associated with significant improvement in overall quality of life, self-image, physical symptoms, and general health in studies assessing change in quality of life.  PHQ-9: Review Flowsheet       02/13/2022 08/06/2018  Depression screen PHQ 2/9  Decreased Interest 0 0  Down, Depressed, Hopeless 0 0  PHQ - 2 Score 0 0  Altered sleeping 1 1  Tired, decreased energy 3 1  Change in appetite 0 0  Feeling bad or failure about yourself  0 0  Trouble concentrating 0 0  Moving slowly or fidgety/restless 0 0  Suicidal thoughts 0 -  PHQ-9 Score 4 2  Difficult doing work/chores Somewhat difficult -   Interpretation of Total Score  Total Score Depression Severity:  1-4 = Minimal depression, 5-9 = Mild depression, 10-14 = Moderate depression, 15-19 = Moderately severe depression, 20-27 = Severe  depression   Psychosocial Evaluation and Intervention:  Psychosocial Evaluation - 02/13/22 0918       Psychosocial Evaluation & Interventions   Interventions Encouraged to exercise with the program and follow exercise prescription    Comments Pt has no barriers to paricipate in CR. He has no identifiable psychosocial issues. He scored a 4 on his PHQ-9 and he relates this to his heart condition. He feels like he never has any energy. This has worsened over the past few months. He was previously exercising multiple days per week at the [facility], but then he began having problems with fluid retention. This has since been taken care of, but [facility] He reports that his sister is his main support system. He is seperated from his wife, but this was not something that he brought up or spoke about at all. His goals while in the program are to lose weight and to regain his strength and stamina. He has a positive outlook and is eager to start the program.    Expected Outcomes Pt will continue to have no identifiable psychosocial issues.    Continue Psychosocial Services  No Follow up required             Psychosocial Re-Evaluation:  Psychosocial Re-Evaluation     Row Name 03/03/22 0915             Psychosocial Re-Evaluation   Current issues with None Identified       Comments Patient is new to the program completing 5 sessions. He continues to have no psychosocial barriers identified. He enjoys coming to the sessions and demonstrates an interest in improving his health. We will continue to monitor.       Expected Outcomes Patient will not have any psychosocial issues identified at dishcharge.        Interventions Stress management education;Relaxation education;Encouraged to attend Cardiac Rehabilitation for the exercise       Continue Psychosocial Services  No Follow up required                Psychosocial Discharge (Final Psychosocial Re-Evaluation):   Psychosocial Re-Evaluation - 03/03/22 0915       Psychosocial Re-Evaluation   Current issues with None Identified    Comments Patient is new to the program completing 5 sessions. He continues to have no psychosocial barriers identified. He enjoys coming to the sessions and demonstrates an interest in improving his health. We will continue to monitor.    Expected Outcomes Patient will not have any  psychosocial issues identified at dishcharge.     Interventions Stress management education;Relaxation education;Encouraged to attend Cardiac Rehabilitation for the exercise    Continue Psychosocial Services  No Follow up required             Vocational Rehabilitation: Provide vocational rehab assistance to qualifying candidates.   Vocational Rehab Evaluation & Intervention:  Vocational Rehab - 02/13/22 0829       Initial Vocational Rehab Evaluation & Intervention   Assessment shows need for Vocational Rehabilitation No      Vocational Rehab Re-Evaulation   Comments He is disabled and not going back to work.             Education: Education Goals: Education classes will be provided on a weekly basis, covering required topics. Participant will state understanding/return demonstration of topics presented.  Learning Barriers/Preferences:  Learning Barriers/Preferences - 02/13/22 8527       Learning Barriers/Preferences   Learning Barriers None    Learning Preferences Group Instruction;Individual Instruction;Skilled Demonstration;Written Material             Education Topics: Hypertension, Hypertension Reduction -Define heart disease and high blood pressure. Discus how high blood pressure affects the body and ways to reduce high blood pressure. Flowsheet Row CARDIAC REHAB PHASE II EXERCISE from 08/04/2018 in [place] CARDIAC REHABILITATION  Date 07/28/18  Educator D. Coad  Instruction Review Code 2- Demonstrated Understanding       Exercise and Your  Heart -Discuss why it is important to exercise, the FITT principles of exercise, normal and abnormal responses to exercise, and how to exercise safely. Flowsheet Row CARDIAC REHAB PHASE II EXERCISE from 08/04/2018 in [place] CARDIAC REHABILITATION  Date 05/05/18  Educator D. Coad  Instruction Review Code 2- Demonstrated Understanding       Angina -Discuss definition of angina, causes of angina, treatment of angina, and how to decrease risk of having angina.   Cardiac Medications -Review what the following cardiac medications are used for, how they affect the body, and side effects that may occur when taking the medications.  Medications include Aspirin, Beta blockers, calcium channel blockers, ACE Inhibitors, angiotensin receptor blockers, diuretics, digoxin, and antihyperlipidemics. Flowsheet Row CARDIAC REHAB PHASE II EXERCISE from 08/04/2018 in [place] CARDIAC REHABILITATION  Date 05/19/18  Educator Timoteo Expose  Instruction Review Code 2- Demonstrated Understanding       Congestive Heart Failure -Discuss the definition of CHF, how to live with CHF, the signs and symptoms of CHF, and how keep track of weight and sodium intake. Flowsheet Row CARDIAC REHAB PHASE II EXERCISE from 08/04/2018 in [place] C[facility]IAC REHAB[facility]IT[facility]  Date 05/26/18  Educator D. Coad   Instruction Review Code 2- Demonstrated Understanding       Heart Disease and Intimacy -Discus the effect sexual activity has on the heart, how changes occur during intimacy as we age, and safety during sexual activity. Flowsheet Row CARDIAC REHAB PHASE II EXERCISE from 08/04/2018 in [place] C[facility]IAC REHAB[facility]  Date 06/02/18  Educator Timoteo Expose  Instruction Review Code 2- Demonstrated Understanding       Smoking Cessation / COPD -Discuss different methods to quit smoking, the health benefits of quitting smoking, and the definition of COPD. Flowsheet Row CARDIAC REHAB PHASE II EXERCISE from 08/04/2018  in [place] CARDIAC REHABILITATION  Date 06/09/18  Educator Timoteo Expose  Instruction Review Code 2- Demonstrated Understanding       Nutrition I: Fats -Discuss the types of cholesterol, what cholesterol does to the  heart, and how cholesterol levels can be controlled. Flowsheet Row CARDIAC REHAB PHASE II EXERCISE from 08/04/2018 in [place] CARDIAC REHAB[facility]ITATION  Date 06/16/18  Educator D. Coad  Instruction Review Code 2- Demonstrated Understanding       Nutrition II: Labels -Discuss the different components of food labels and how to read food label Flowsheet Row CARDIAC REHAB PHASE II EXERCISE from 08/04/2018 in [place] CARDIAC REHABILITATION  Date 06/23/18  Educator Timoteo Expose  Instruction Review Code 2- Demonstrated Understanding       Heart Parts/Heart Disease and PAD -Discuss the anatomy of the heart, the pathway of blood circulation through the heart, and these are affected by heart disease. Flowsheet Row CARDIAC REHAB PHASE II EXERCISE from 03/05/2022 in [place] CARDIAC REHABILITATION  Date 03/05/22  Educator HJ  Instruction Review Code 2- Demonstrated Understanding       Stress I: Signs and Symptoms -Discuss the causes of stress, how stress may lead to anxiety and depression, and ways to limit stress. Flowsheet Row CARDIAC REHAB PHASE II EXERCISE from 08/04/2018 in [place] CARDIAC REHABILITATION  Date 07/07/18  Educator D. Coad  Instruction Review Code 2- Demonstrated Understanding       Stress II: Relaxation -Discuss different types of relaxation techniques to limit stress. Flowsheet Row CARDIAC REHAB PHASE II EXERCISE from 08/04/2018 in [place] CARDIAC REHABILITATION  Date 07/14/18  Educator D. Coad   Instruction Review Code 2- Demonstrated Understanding       Warning Signs of Stroke / TIA -Discuss definition of a stroke, what the signs and symptoms are of a stroke, and how to identify when someone is having stroke.   Knowledge  Questionnaire Score:  Knowledge Questionnaire Score - 02/13/22 0853       Knowledge Questionnaire Score   Pre Score 20/24             Core Components/Risk Factors/Patient Goals at Admission:  Personal Goals and Risk Factors at Admission - 02/13/22 0855       Core Components/Risk Factors/Patient Goals on Admission    Weight Management Yes;Obesity;Weight Loss    Intervention Weight Management: Develop a combined nutrition and exercise program designed to reach desired caloric intake, while maintaining appropriate intake of nutrient and fiber, sodium and fats, and appropriate energy expenditure required for the weight goal.;Weight Management: Provide education and appropriate resources to help participant work on and attain dietary goals.;Weight Management/Obesity: Establish reasonable short term and long term weight goals.;Obesity: Provide education and appropriate resources to help participant work on and attain dietary goals.    Expected Outcomes Short Term: Continue to assess and modify interventions until short term weight is achieved;Long Term: Adherence to nutrition and physical activity/exercise program aimed toward attainment of established weight goal;Weight Maintenance: Understanding of the daily nutrition guidelines, which includes 25-35% calories from fat, 7% or less cal from saturated fats, less than 200mg  cholesterol, less than 1.5gm of sodium, & 5 or more servings of fruits and vegetables daily;Weight Loss: Understanding of general recommendations for a balanced deficit meal plan, which promotes 1-2 lb weight loss per week and includes a negative energy balance of [phone] kcal/d;Understanding recommendations for meals to include 15-35% energy as protein, 25-35% energy from fat, 35-60% energy from carbohydrates, less than 200mg  of dietary cholesterol, 20-35 gm of total fiber daily;Understanding of distribution of calorie intake throughout the day with the consumption of 4-5  meals/snacks    Diabetes Yes    Intervention Provide education about signs/symptoms and action to take for  hypo/hyperglycemia.;Provide education about proper nutrition, including hydration, and aerobic/resistive exercise prescription along with prescribed medications to achieve blood glucose in normal ranges: Fasting glucose 65-99 mg/dL    Expected Outcomes Short Term: Participant verbalizes understanding of the signs/symptoms and immediate care of hyper/hypoglycemia, proper foot care and importance of medication, aerobic/resistive exercise and nutrition plan for blood glucose control.;Long Term: Attainment of HbA1C < 7%.    Heart Failure Yes    Intervention Provide a combined exercise and nutrition program that is supplemented with education, support and counseling about heart failure. Directed toward relieving symptoms such as shortness of breath, decreased exercise tolerance, and extremity edema.    Expected Outcomes Improve functional capacity of life;Short term: Attendance in program 2-3 days a week with increased exercise capacity. Reported lower sodium intake. Reported increased fruit and vegetable intake. Reports medication compliance.;Short term: Daily weights obtained and reported for increase. Utilizing diuretic protocols set by physician.;Long term: Adoption of self-care skills and reduction of barriers for early signs and symptoms recognition and intervention leading to self-care maintenance.    Hypertension Yes    Intervention Provide education on lifestyle modifcations including regular physical activity/exercise, weight management, moderate sodium restriction and increased consumption of fresh fruit, vegetables, and low fat dairy, alcohol moderation, and smoking cessation.;Monitor prescription use compliance.    Expected Outcomes Short Term: Continued assessment and intervention until BP is < 140/25mm HG in hypertensive participants. < 130/49mm HG in hypertensive participants with diabetes,  heart failure or chronic kidney disease.;Long Term: Maintenance of blood pressure at goal levels.    Personal Goal Other Yes    Personal Goal Increase strength and stamina    Intervention Attend CR 3 days per week and exercise at home 2-4 days per week    Expected Outcomes Pt will report improvement in his strength and stamina and his six minute walk test distance will increase.             Core Components/Risk Factors/Patient Goals Review:   Goals and Risk Factor Review     Row Name 03/03/22 0916             Core Components/Risk Factors/Patient Goals Review   Personal Goals Review Weight Management/Obesity;Heart Failure;Hypertension       Review Patient was referred to CR with chronic systolic HF. He has multiple risk factors for CAD and is participating in the program for risk modification.  He has complete 5 sessions. His current weight is 278 up 11 lbs from his initial visit. He saw his cardiologist at [facility] 6/6 for HF management. No changes were made. His last A1C from those notes was 8.8%. He is on Metformin for DM management. His home glucose readings average 170 g/dl. His blood pressue is at goal. He reported to his cardiologist he was having less SOB and is feeling stronger. His personal goals for the program are to lose weight and improve his strength and stamina. We will continue to monitor his progress as he works towards meeting these goals.       Expected Outcomes Patient will complet the program meeting both personal and program goals.                Core Components/Risk Factors/Patient Goals at Discharge (Final Review):   Goals and Risk Factor Review - 03/03/22 0916       Core Components/Risk Factors/Patient Goals Review   Personal Goals Review Weight Management/Obesity;Heart Failure;Hypertension    Review Patient was referred to CR with chronic systolic HF. He has  multiple risk factors for CAD and is participating in the program for risk modification.  He has  complete 5 sessions. His current weight is 278 up 11 lbs from his initial visit. He saw his cardiologist at [facility] 6/6 for HF management. No changes were made. His last A1C from those notes was 8.8%. He is on Metformin for DM management. His home glucose readings average 170 g/dl. His blood pressue is at goal. He reported to his cardiologist he was having less SOB and is feeling stronger. His personal goals for the program are to lose weight and improve his strength and stamina. We will continue to monitor his progress as he works towards meeting these goals.    Expected Outcomes Patient will complet the program meeting both personal and program goals.             ITP Comments:   Comments: ITP REVIEW Pt is making expected progress toward Cardiac Rehab goals after completing 9 sessions. Recommend continued exercise, life style modification, education, and increased stamina and strength.+

## 2022-03-12 NOTE — Progress Notes (Signed)
Daily Session Note  Patient Details  Name: Michael Vaughn MRN: [id number] Date of Birth: 1961/08/24 Referring Provider:   [name] from 02/13/2022 in [place]  Referring Provider Dr. Ula Lingo       Encounter Date: 03/12/2022  Check In:  Session Check In - 03/12/22 0930       Check-In   Supervising physician immediately available to respond to emergencies [facility] MD immediately available    Physician(s) Dr. Gasper Sells    Location [facility]    Staff Present Redge Gainer, BS, Exercise Physiologist;Dalton Kris Mouton, MS, ACSM-CEP, Exercise Physiologist    Virtual Visit No    Medication changes reported     No    Fall or balance concerns reported    No    Tobacco Cessation No Change    Warm-up and Cool-down Performed as group-led instruction    Resistance Training Performed Yes    VAD Patient? No    PAD/SET Patient? No      Pain Assessment   Currently in Pain? No/denies    Multiple Pain Sites No             Capillary Blood Glucose: No results found for this or any previous visit (from the past 24 hour(s)).    Social History   Tobacco Use  Smoking Status Former   Packs/day: 1.00   Years: 5.00   Total pack years: 5.00   Types: Cigarettes   Quit date: 09/22/1989   Years since quitting: 32.4  Smokeless Tobacco Former   Types: Snuff   Quit date: 09/22/1989  Tobacco Comments   dipped snuff for one year    Goals Met:  Independence with exercise equipment Exercise tolerated well No report of concerns or symptoms today Strength training completed today  Goals Unmet:  Not Applicable  Comments: check out 1030   Dr. Carlyle Dolly is Medical Director for [facility]

## 2022-03-14 ENCOUNTER — Encounter (HOSPITAL_COMMUNITY)
Admission: RE | Admit: 2022-03-14 | Discharge: 2022-03-14 | Disposition: A | Discharge status: Home / Self Care | Payer: Medicaid Other | Source: Ambulatory Visit | Attending: Cardiology | Admitting: Cardiology

## 2022-03-14 DIAGNOSIS — I5022 Chronic systolic (congestive) heart failure: Secondary | ICD-10-CM

## 2022-03-17 ENCOUNTER — Encounter (HOSPITAL_COMMUNITY)
Admission: RE | Admit: 2022-03-17 | Discharge: 2022-03-17 | Disposition: A | Discharge status: Home / Self Care | Payer: Medicaid Other | Source: Ambulatory Visit | Attending: Cardiology | Admitting: Cardiology

## 2022-03-17 DIAGNOSIS — I5022 Chronic systolic (congestive) heart failure: Secondary | ICD-10-CM | POA: Diagnosis not present

## 2022-03-19 ENCOUNTER — Encounter (HOSPITAL_COMMUNITY)
Admission: RE | Admit: 2022-03-19 | Discharge: 2022-03-19 | Disposition: A | Discharge status: Home / Self Care | Payer: Medicaid Other | Source: Ambulatory Visit | Attending: Cardiology | Admitting: Cardiology

## 2022-03-19 DIAGNOSIS — I5022 Chronic systolic (congestive) heart failure: Secondary | ICD-10-CM | POA: Diagnosis not present

## 2022-03-19 NOTE — Progress Notes (Signed)
Daily Session Note  Patient Details  Name: Michael Vaughn MRN: [id number] Date of Birth: Jun 24, 1961 Referring Provider:   [name] from 02/13/2022 in [place]  Referring Provider Dr. Ula Lingo       Encounter Date: 03/19/2022  Check In:  Session Check In - 03/19/22 0930       Check-In   Supervising physician immediately available to respond to emergencies [facility] MD immediately available    Physician(s) Dr Harl Bowie    Location [facility]    Staff Present Redge Gainer, BS, Exercise Physiologist;Dalton Kris Mouton, MS, ACSM-CEP, Exercise Physiologist;Sonita Michiels Hassell Done, RN, BSN    Virtual Visit No    Medication changes reported     No    Fall or balance concerns reported    No    Tobacco Cessation No Change    Warm-up and Cool-down Performed as group-led instruction    Resistance Training Performed Yes    VAD Patient? No    PAD/SET Patient? No      Pain Assessment   Currently in Pain? No/denies    Multiple Pain Sites No             Capillary Blood Glucose: No results found for this or any previous visit (from the past 24 hour(s)).    Social History   Tobacco Use  Smoking Status Former   Packs/day: 1.00   Years: 5.00   Total pack years: 5.00   Types: Cigarettes   Quit date: 09/22/1989   Years since quitting: 32.5  Smokeless Tobacco Former   Types: Snuff   Quit date: 09/22/1989  Tobacco Comments   dipped snuff for one year    Goals Met:  Independence with exercise equipment Exercise tolerated well No report of concerns or symptoms today Strength training completed today  Goals Unmet:  Not Applicable  Comments: checkout at 1030.   Dr. Carlyle Dolly is Medical Director for [facility]

## 2022-03-19 NOTE — Progress Notes (Signed)
I have reviewed a Home Exercise Prescription with Michael Vaughn is  currently exercising at home.  The patient was advised to walk 5 days a week for 30-45 minutes.  Michael Vaughn and I discussed how to progress their exercise prescription.  The patient stated that their goals were build up his energy and endurance.  The patient stated that they understand the exercise prescription.  We reviewed exercise guidelines, target heart rate during exercise, RPE Scale, weather conditions, NTG use, endpoints for exercise, warmup and cool down.  Patient is encouraged to come to me with any questions. I will continue to follow up with the patient to assist them with progression and safety.

## 2022-03-21 ENCOUNTER — Encounter (HOSPITAL_COMMUNITY)
Admission: RE | Admit: 2022-03-21 | Discharge: 2022-03-21 | Disposition: A | Discharge status: Home / Self Care | Payer: Medicaid Other | Source: Ambulatory Visit | Attending: Cardiology | Admitting: Cardiology

## 2022-03-21 DIAGNOSIS — I5022 Chronic systolic (congestive) heart failure: Secondary | ICD-10-CM

## 2022-03-21 NOTE — Progress Notes (Signed)
Daily Session Note  Patient Details  Name: Michael Vaughn MRN: [id number] Date of Birth: July 02, 1961 Referring Provider:   [name] from 02/13/2022 in [place]  Referring Provider Dr. Ula Lingo       Encounter Date: 03/21/2022  Check In:  Session Check In - 03/21/22 0930       Check-In   Supervising physician immediately available to respond to emergencies [facility] MD immediately available    Physician(s) Dr. Gasper Sells    Location [facility]    Staff Present Redge Gainer, BS, Exercise Physiologist;Dalton Kris Mouton, MS, ACSM-CEP, Exercise Physiologist;Daphyne Hassell Done, RN, BSN    Virtual Visit No    Medication changes reported     No    Fall or balance concerns reported    No    Tobacco Cessation No Change    Warm-up and Cool-down Performed as group-led instruction    Resistance Training Performed Yes    VAD Patient? No    PAD/SET Patient? No      Pain Assessment   Currently in Pain? No/denies    Multiple Pain Sites No             Capillary Blood Glucose: No results found for this or any previous visit (from the past 24 hour(s)).    Social History   Tobacco Use  Smoking Status Former   Packs/day: 1.00   Years: 5.00   Total pack years: 5.00   Types: Cigarettes   Quit date: 09/22/1989   Years since quitting: 32.5  Smokeless Tobacco Former   Types: Snuff   Quit date: 09/22/1989  Tobacco Comments   dipped snuff for one year    Goals Met:  Independence with exercise equipment Exercise tolerated well No report of concerns or symptoms today Strength training completed today  Goals Unmet:  Not Applicable  Comments: check out 1030   Dr. Carlyle Dolly is Medical Director for [facility]

## 2022-03-24 ENCOUNTER — Emergency Department (HOSPITAL_COMMUNITY): Payer: Medicaid Other

## 2022-03-24 ENCOUNTER — Other Ambulatory Visit: Payer: Self-pay

## 2022-03-24 ENCOUNTER — Encounter (HOSPITAL_COMMUNITY): Payer: Self-pay

## 2022-03-24 ENCOUNTER — Encounter (HOSPITAL_COMMUNITY): Payer: Medicaid Other

## 2022-03-24 ENCOUNTER — Telehealth (HOSPITAL_COMMUNITY): Payer: Medicaid Other

## 2022-03-24 ENCOUNTER — Emergency Department (HOSPITAL_COMMUNITY)
Admission: EM | Admit: 2022-03-24 | Discharge: 2022-03-24 | Disposition: A | Discharge status: Home / Self Care | Payer: Medicaid Other | Attending: Emergency Medicine | Admitting: Emergency Medicine

## 2022-03-24 DIAGNOSIS — R072 Precordial pain: Secondary | ICD-10-CM | POA: Diagnosis present

## 2022-03-24 DIAGNOSIS — D649 Anemia, unspecified: Secondary | ICD-10-CM | POA: Diagnosis not present

## 2022-03-24 DIAGNOSIS — E119 Type 2 diabetes mellitus without complications: Secondary | ICD-10-CM | POA: Diagnosis not present

## 2022-03-24 DIAGNOSIS — I2510 Atherosclerotic heart disease of native coronary artery without angina pectoris: Secondary | ICD-10-CM | POA: Diagnosis not present

## 2022-03-24 DIAGNOSIS — Z794 Long term (current) use of insulin: Secondary | ICD-10-CM | POA: Diagnosis not present

## 2022-03-24 DIAGNOSIS — D72829 Elevated white blood cell count, unspecified: Secondary | ICD-10-CM | POA: Diagnosis not present

## 2022-03-24 DIAGNOSIS — R079 Chest pain, unspecified: Secondary | ICD-10-CM

## 2022-03-24 DIAGNOSIS — R110 Nausea: Secondary | ICD-10-CM | POA: Diagnosis not present

## 2022-03-24 DIAGNOSIS — I10 Essential (primary) hypertension: Secondary | ICD-10-CM | POA: Diagnosis not present

## 2022-03-24 DIAGNOSIS — Z7984 Long term (current) use of oral hypoglycemic drugs: Secondary | ICD-10-CM | POA: Diagnosis not present

## 2022-03-24 DIAGNOSIS — R0600 Dyspnea, unspecified: Secondary | ICD-10-CM | POA: Diagnosis not present

## 2022-03-24 DIAGNOSIS — Z79899 Other long term (current) drug therapy: Secondary | ICD-10-CM | POA: Diagnosis not present

## 2022-03-24 LAB — TROPONIN I (HIGH SENSITIVITY)
Troponin I (High Sensitivity): 37 ng/L — ABNORMAL HIGH (ref <18)
Troponin I (High Sensitivity): 34 ng/L — ABNORMAL HIGH (ref <18)

## 2022-03-24 LAB — CBC
HCT: 32.7 % — ABNORMAL LOW (ref 39.0–52.0)
Hemoglobin: 11 g/dL — ABNORMAL LOW (ref 13.0–17.0)
MCH: 31.3 pg (ref 26.0–34.0)
MCHC: 33.6 g/dL (ref 30.0–36.0)
MCV: 93.2 fL (ref 80.0–100.0)
Platelets: 219 10*3/uL (ref 150–400)
RBC: 3.51 MIL/uL — ABNORMAL LOW (ref 4.22–5.81)
RDW: 14 % (ref 11.5–15.5)
WBC: 11.2 10*3/uL — ABNORMAL HIGH (ref 4.0–10.5)
nRBC: 0 % (ref 0.0–0.2)

## 2022-03-24 LAB — BASIC METABOLIC PANEL
Anion gap: 7 (ref 5–15)
BUN: 30 mg/dL — ABNORMAL HIGH (ref 6–20)
CO2: 28 mmol/L (ref 22–32)
Calcium: 9 mg/dL (ref 8.9–10.3)
Chloride: 103 mmol/L (ref 98–111)
Creatinine, Ser: 1.82 mg/dL — ABNORMAL HIGH (ref 0.61–1.24)
GFR, Estimated: 42 mL/min — ABNORMAL LOW (ref >60)
Glucose, Bld: 184 mg/dL — ABNORMAL HIGH (ref 70–99)
Potassium: 4.1 mmol/L (ref 3.5–5.1)
Sodium: 138 mmol/L (ref 135–145)

## 2022-03-24 LAB — BRAIN NATRIURETIC PEPTIDE: B Natriuretic Peptide: 438 pg/mL — ABNORMAL HIGH (ref 0.0–100.0)

## 2022-03-24 NOTE — Discharge Instructions (Signed)
Please follow-up with your cardiologist for further evaluation.  Please return to the emergency department for any worsening symptoms you might have.

## 2022-03-24 NOTE — ED Notes (Signed)
X-ray at bedside

## 2022-03-24 NOTE — ED Triage Notes (Signed)
Patient with complanints of shortness of breath after cardiac rehab on Friday. Developed weakness this past weekend and chest pressure this morning.

## 2022-03-24 NOTE — ED Provider Notes (Signed)
[facility] EMERGENCY DEPARTMENT Provider Note   CSN: [id number] Arrival date & time: 03/24/22  7169     History Chief Complaint  Patient presents with   Chest Pain    Michael Vaughn is a 61 y.o. male patient with history of diabetes mellitus, hyperlipidemia, hypertension, CAD who presents to the emergency department with substernal chest pressure which has been constant since doing cardiac rehab on Friday.  Patient states he had to stay late on Friday as his heart rate would not come down after doing his exercises.  He also been having some exertional dyspnea.  Patient also mentions that he is had to increase his torsemide to 60 mg as he has been holding fluid.  Weights have been steady over the last week.  No cough, congestion, fever, chills, vomiting, diarrhea.  Patient endorses associated nausea as well.  Per chart review, patient had a TTE done back in March which showed an ejection fraction of 15 to 20%.  Cardiac catheterization on 01/09/2022 showed multivessel disease including LAD lesion that is 95% stenosis, left circumflex which is 70% stenosis, and right coronary which is 100% stenosed.     Chest Pain      Home Medications Prior to Admission medications   Medication Sig Start Date End Date Taking? Authorizing Provider  allopurinol (ZYLOPRIM) 300 MG tablet Take 300 mg by mouth daily.    [provider]  aspirin 325 MG tablet Take 325 mg by mouth daily.    [provider]  finasteride (PROSCAR) 5 MG tablet Take 5 mg by mouth daily. 02/16/19   [provider]  fluticasone (FLONASE) 50 MCG/ACT nasal spray Place 1 spray into both nostrils daily as needed for allergies.    [provider]  glipiZIDE (GLUCOTROL XL) 10 MG 24 hr tablet Take 10 mg by mouth daily.      [provider]  HYDROcodone-acetaminophen (NORCO/VICODIN) 5-325 MG tablet Take 1 tablet by mouth every 12 (twelve) hours as needed for moderate pain.    [provider]  insulin glargine (LANTUS) 100 UNIT/ML injection Inject 24 Units into the skin daily. 04/22/17   [provider]  magnesium oxide (MAG-OX) 400 MG tablet Take 400 mg by mouth daily.    [provider]  meclizine (ANTIVERT) 25 MG tablet Take 1 tablet by mouth 3 (three) times daily as needed for dizziness. 11/07/21   [provider]  metFORMIN (GLUCOPHAGE) 1000 MG tablet Take 1,000 mg by mouth 2 (two) times daily with a meal.    [provider]  metoprolol succinate (TOPROL-XL) 50 MG 24 hr tablet Take 50 mg by mouth daily. 06/25/17   [provider]  Multiple Vitamin (MULTI-VITAMINS) TABS Take 1 tablet daily by mouth.    [provider]  nitroGLYCERIN (NITROSTAT) 0.4 MG SL tablet Place 0.4 mg under the tongue. 10/13/18   [provider]  omega-3 acid ethyl esters (LOVAZA) 1 g capsule Take 1 g daily by mouth.    [provider]  Polyethyl Glycol-Propyl Glycol (SYSTANE) 0.4-0.3 % SOLN Place 1 application. into both eyes daily as needed (Dry eyes).    [provider]  rosuvastatin (CRESTOR) 40 MG tablet Take 40 mg by mouth daily. 10/22/21 10/22/22  [provider]  sacubitril-valsartan (ENTRESTO) 24-26 MG Take 1 tablet by mouth 2 (two) times daily. 03/18/21   [provider]  senna-docusate (SENOKOT-S) 8.6-50 MG tablet Take 2 tablets by mouth 2 (two) times daily as needed for mild constipation  or moderate constipation. 05/19/17   [provider]  spironolactone (ALDACTONE) 25 MG tablet Take 12.5 mg daily by mouth. 06/25/17   [provider]  tadalafil (CIALIS) 5 MG tablet Take 5 mg by mouth daily. 05/01/19   [provider]  tamsulosin (FLOMAX) 0.4 MG CAPS capsule Take 0.4 mg by mouth daily.    [provider]  torsemide (DEMADEX) 20 MG tablet Take 60 mg by mouth daily. 12/23/21   [provider]      Allergies    Exenatide and Jardiance [empagliflozin]    Review of  Systems   Review of Systems  Cardiovascular:  Positive for chest pain.  All other systems reviewed and are negative.   Physical Exam Updated Vital Signs BP 104/69   Pulse 76   Temp 97.9 F (36.6 C) (Oral)   Resp 13   Ht 5\' 11"  (1.803 m)   Wt 122.9 kg   SpO2 100%   BMI 37.80 kg/m  Physical Exam Vitals and nursing note reviewed.  Constitutional:      General: He is not in acute distress.    Appearance: Normal appearance.  HENT:     Head: Normocephalic and atraumatic.  Eyes:     General:        Right eye: No discharge.        Left eye: No discharge.  Cardiovascular:     Comments: Regular rate and rhythm.  S1/S2 are distinct without any evidence of murmur, rubs, or gallops.  Radial pulses are 2+ bilaterally.  Dorsalis pedis pulses are 2+ bilaterally.  No evidence of pedal edema. Pulmonary:     Comments: Clear to auscultation bilaterally.  Normal effort.  No respiratory distress.  No evidence of wheezes, rales, or rhonchi heard throughout. Abdominal:     General: Abdomen is flat. Bowel sounds are normal. There is no distension.     Tenderness: There is no abdominal tenderness. There is no guarding or rebound.  Musculoskeletal:        General: Normal range of motion.     Cervical back: Neck supple.  Skin:    General: Skin is warm and dry.     Findings: No rash.  Neurological:     General: No focal deficit present.     Mental Status: He is alert.  Psychiatric:        Mood and Affect: Mood normal.        Behavior: Behavior normal.     ED Results / Procedures / Treatments   Labs (all labs ordered are listed, but only abnormal results are displayed) Labs Reviewed  BASIC METABOLIC PANEL - Abnormal; Notable for the following components:      Result Value   Glucose, Bld 184 (*)    BUN 30 (*)    Creatinine, Ser 1.82 (*)    GFR, Estimated 42 (*)    All other components within normal limits  CBC - Abnormal; Notable for the following components:   WBC 11.2 (*)    RBC  3.51 (*)    Hemoglobin 11.0 (*)    HCT 32.7 (*)    All other components within normal limits  BRAIN NATRIURETIC PEPTIDE - Abnormal; Notable for the following components:   B Natriuretic Peptide 438.0 (*)    All other components within normal limits  TROPONIN I (HIGH SENSITIVITY) - Abnormal; Notable for the following components:   Troponin I (High Sensitivity) 37 (*)    All other components within normal limits  TROPONIN  I (HIGH SENSITIVITY) - Abnormal; Notable for the following components:   Troponin I (High Sensitivity) 34 (*)    All other components within normal limits    EKG EKG Interpretation  Date/Time:  Monday March 24 2022 09:23:28 EDT Ventricular Rate:  82 PR Interval:  210 QRS Duration: 110 QT Interval:  372 QTC Calculation: 435 R Axis:   37 Text Interpretation: Sinus rhythm Paired ventricular premature complexes Prolonged PR interval Consider anterolateral infarct Borderline T abnormalities, inferior leads Confirmed by Gloris Manchester [id number]) on 03/24/2022 9:50:26 AM  Radiology DG Chest Port 1 View  Result Date: 03/24/2022 CLINICAL DATA:  Shortness of breath EXAM: PORTABLE CHEST 1 VIEW COMPARISON:  06/11/2021 FINDINGS: Similar cardiomegaly. Left chest wall ICD. No consolidation or edema. No pleural effusion. No pneumothorax. IMPRESSION: No acute process in the chest. Electronically Signed   By: Guadlupe Spanish M.D.   On: 03/24/2022 09:47    Procedures Procedures    Medications Ordered in ED Medications - No data to display  ED Course/ Medical Decision Making/ A&P Clinical Course as of 03/24/22 1539  Mon Mar 24, 2022  1350 I spoke Dr. Eden Emms with cardiology who recommends calling his cardiologist.  He reviewed EKG and troponins and feels that the patient can follow-up with his cardiologist in the outpatient setting. [CF]  1525 I discussed this case with my attending physician who cosigned this note including patient's presenting symptoms, physical exam, and planned  diagnostics and interventions. Attending physician stated agreement with plan or made changes to plan which were implemented.   Attending physician assessed patient at bedside.   [CF]  1538 CBC(!) There is evidence of leukocytosis and anemia.  Anemia improved from baseline. [CF]  1538 Brain natriuretic peptide(!) BNP 438.  I have a low suspicion for heart failure at this time. [CF]  1538 Basic metabolic panel(!) Creatinine elevated today which seems to be at the patient's baseline.  Slightly elevated from previous result. [CF]  1538 Troponin I (High Sensitivity)(!) Initial and delta troponin are slightly elevated. [CF]  1538 DG Chest Port 1 View I personally ordered and interpreted chest x-ray which was negative for any pneumonia.  I do agree with the radiologist interpretation. [CF]    Clinical Course User Index [CF] Teressa Lower, PA-C                           Medical Decision Making Michael Vaughn is a 61 y.o. male patient who presents to the emergency room today for further evaluation of substernal chest pressure that is been ongoing for the last 3 days since he went to cardiac rehab.  Also endorsing some exertional dyspnea.  Given his significant cardiac history we will get cardiac labs, EKG, and chest x-ray to further evaluate.  Patient is in no acute distress at this time and vital signs are normal.   Amount and/or Complexity of Data Reviewed External Data Reviewed: notes.    Details: Highlighted in HPI. Labs: ordered. Decision-making details documented in ED Course. Radiology: ordered and independent interpretation performed. Decision-making details documented in ED Course. ECG/medicine tests: ordered and independent interpretation performed. Decision-making details documented in ED Course.  Risk Risk Details: After consulting cardiology, sure decision-making done with the patient at the bedside.  He is comfortable going home and following up with his cardiologist.  We  discussed strict return precautions at the bedside.  He is safe for discharge at this time.    Final Clinical  Impression(s) / ED Diagnoses Final diagnoses:  Chest pain, unspecified type    Rx / DC Orders ED Discharge Orders     None         Jolyn Lent 03/24/22 1539    Gloris Manchester, MD 03/26/22 [phone]

## 2022-03-24 NOTE — Progress Notes (Signed)
Called by ER regarding patient His troponin is essentially negative ECG non acute SR first degree PVC BNP 438 which is down from earlier in June when it was 600-700 He is followed closely by EP/Nephrology/CHF clinic at [facility] CXR NAD  Does not appear to need admission based on above.  Encouraged ER to call his cardiologist at [facility] and discuss There is no cardiology coverage at [facility] over holiday and he Should be seen closely in f/u by them  Charlton Haws MD [facility]

## 2022-03-24 NOTE — ED Notes (Signed)
Lab at bedside

## 2022-03-26 ENCOUNTER — Encounter (HOSPITAL_COMMUNITY): Payer: Medicaid Other

## 2022-03-28 ENCOUNTER — Encounter (HOSPITAL_COMMUNITY): Payer: Medicaid Other

## 2022-03-31 ENCOUNTER — Encounter (HOSPITAL_COMMUNITY)
Admission: RE | Admit: 2022-03-31 | Discharge: 2022-03-31 | Disposition: A | Discharge status: Home / Self Care | Payer: Medicaid Other | Source: Ambulatory Visit | Attending: Cardiology | Admitting: Cardiology

## 2022-03-31 DIAGNOSIS — I5022 Chronic systolic (congestive) heart failure: Secondary | ICD-10-CM | POA: Diagnosis not present

## 2022-03-31 NOTE — Progress Notes (Signed)
Daily Session Note  Patient Details  Name: Michael Vaughn MRN: [id number] Date of Birth: March 22, 1961 Referring Provider:   [name] from 02/13/2022 in [place]  Referring Provider Dr. Ula Lingo       Encounter Date: 03/31/2022  Check In:  Session Check In - 03/31/22 0930       Check-In   Supervising physician immediately available to respond to emergencies [facility] MD immediately available    Physician(s) Dr. Harrington Challenger    Location [facility]    Staff Present Redge Gainer, BS, Exercise Physiologist;Athleen Feltner Kris Mouton, MS, ACSM-CEP, Exercise Physiologist    Virtual Visit No    Medication changes reported     No    Fall or balance concerns reported    No    Tobacco Cessation No Change    Warm-up and Cool-down Performed as group-led instruction    Resistance Training Performed Yes    VAD Patient? No    PAD/SET Patient? No      Pain Assessment   Currently in Pain? No/denies    Pain Score 0-No pain    Multiple Pain Sites No             Capillary Blood Glucose: No results found for this or any previous visit (from the past 24 hour(s)).    Social History   Tobacco Use  Smoking Status Former   Packs/day: 1.00   Years: 5.00   Total pack years: 5.00   Types: Cigarettes   Quit date: 09/22/1989   Years since quitting: 32.5  Smokeless Tobacco Former   Types: Snuff   Quit date: 09/22/1989  Tobacco Comments   dipped snuff for one year    Goals Met:  Independence with exercise equipment Exercise tolerated well No report of concerns or symptoms today Strength training completed today  Goals Unmet:  Not Applicable  Comments: checkout time is 1030   Dr. Carlyle Dolly is Medical Director for [facility]

## 2022-04-02 ENCOUNTER — Encounter (HOSPITAL_COMMUNITY)
Admission: RE | Admit: 2022-04-02 | Discharge: 2022-04-02 | Disposition: A | Discharge status: Home / Self Care | Payer: Medicaid Other | Source: Ambulatory Visit | Attending: Cardiology | Admitting: Cardiology

## 2022-04-02 DIAGNOSIS — I5022 Chronic systolic (congestive) heart failure: Secondary | ICD-10-CM

## 2022-04-02 NOTE — Progress Notes (Signed)
Daily Session Note  Patient Details  Name: Michael Vaughn MRN: [id number] Date of Birth: [birth year] Referring Provider:   [name] from 02/13/2022 in [place]  Referring Provider Dr. Ula Lingo        Encounter Date: 04/02/2022  Check In:  Session Check In - 04/02/22 0930       Check-In   Supervising physician immediately available to respond to emergencies [facility] MD immediately available    Physician(s) Dr Audie Box    Location [facility]    Staff Present Redge Gainer, BS, Exercise Physiologist;Dalton Kris Mouton, MS, ACSM-CEP, Exercise Physiologist;Debra Wynetta Emery, RN, BSN    Virtual Visit No    Medication changes reported     No    Fall or balance concerns reported    No    Tobacco Cessation No Change    Warm-up and Cool-down Performed as group-led instruction    Resistance Training Performed Yes    VAD Patient? No    PAD/SET Patient? No      Pain Assessment   Currently in Pain? No/denies    Pain Score 0-No pain    Multiple Pain Sites No             Capillary Blood Glucose: No results found for this or any previous visit (from the past 24 hour(s)).    Social History   Tobacco Use  Smoking Status Former   Packs/day: 1.00   Years: 5.00   Total pack years: 5.00   Types: Cigarettes   Quit date: 09/22/1989   Years since quitting: 32.5  Smokeless Tobacco Former   Types: Snuff   Quit date: 09/22/1989  Tobacco Comments   dipped snuff for one year    Goals Met:  Independence with exercise equipment Exercise tolerated well No report of concerns or symptoms today Strength training completed today  Goals Unmet:  Not Applicable  Comments: checkout at 1030.   Dr. Carlyle Dolly is Medical Director for [facility]

## 2022-04-04 ENCOUNTER — Encounter (HOSPITAL_COMMUNITY)
Admission: RE | Admit: 2022-04-04 | Discharge: 2022-04-04 | Disposition: A | Discharge status: Home / Self Care | Payer: Medicaid Other | Source: Ambulatory Visit | Attending: Cardiology | Admitting: Cardiology

## 2022-04-04 DIAGNOSIS — I5022 Chronic systolic (congestive) heart failure: Secondary | ICD-10-CM | POA: Diagnosis not present

## 2022-04-04 NOTE — Progress Notes (Signed)
Daily Session Note  Patient Details  Name: Michael Vaughn MRN: [id number] Date of Birth: [birth year] Referring Provider:   [name] from 02/13/2022 in [place]  Referring Provider Dr. Ula Lingo       Encounter Date: 04/04/2022  Check In:  Session Check In - 04/04/22 0930       Check-In   Supervising physician immediately available to respond to emergencies [facility] MD immediately available    Physician(s) Dr. Radford Pax    Staff Present Redge Gainer, BS, Exercise Physiologist;Viyan Rosamond Wynetta Emery, RN, Bjorn Loser, MS, ACSM-CEP, Exercise Physiologist    Virtual Visit No    Medication changes reported     No    Fall or balance concerns reported    No    Tobacco Cessation No Change    Warm-up and Cool-down Performed as group-led instruction    Resistance Training Performed Yes    VAD Patient? No    PAD/SET Patient? No      Pain Assessment   Currently in Pain? No/denies    Pain Score 0-No pain    Multiple Pain Sites No             Capillary Blood Glucose: No results found for this or any previous visit (from the past 24 hour(s)).    Social History   Tobacco Use  Smoking Status Former   Packs/day: 1.00   Years: 5.00   Total pack years: 5.00   Types: Cigarettes   Quit date: 09/22/1989   Years since quitting: 32.5  Smokeless Tobacco Former   Types: Snuff   Quit date: 09/22/1989  Tobacco Comments   dipped snuff for one year    Goals Met:  Independence with exercise equipment Exercise tolerated well No report of concerns or symptoms today Strength training completed today  Goals Unmet:  Not Applicable  Comments: Check out 1030.   Dr. Carlyle Dolly is Medical Director for [facility]

## 2022-04-07 ENCOUNTER — Encounter (HOSPITAL_COMMUNITY)
Admission: RE | Admit: 2022-04-07 | Discharge: 2022-04-07 | Disposition: A | Discharge status: Home / Self Care | Payer: Medicaid Other | Source: Ambulatory Visit | Attending: Cardiology | Admitting: Cardiology

## 2022-04-07 VITALS — Wt 282.2 lb

## 2022-04-07 DIAGNOSIS — I5022 Chronic systolic (congestive) heart failure: Secondary | ICD-10-CM

## 2022-04-07 NOTE — Progress Notes (Signed)
Daily Session Note  Patient Details  Name: Michael Vaughn MRN: [id number] Date of Birth: 1961-01-25 Referring Provider:   [name] from 02/13/2022 in [place]  Referring Provider Dr. Ula Lingo       Encounter Date: 04/07/2022  Check In:  Session Check In - 04/07/22 0930       Check-In   Supervising physician immediately available to respond to emergencies [facility] MD immediately available    Physician(s) Dr. Phineas Inches    Location [facility]    Staff Present Redge Gainer, BS, Exercise Physiologist;Dalton Kris Mouton, MS, ACSM-CEP, Exercise Physiologist;Rhoderick Farrel Wynetta Emery, RN, BSN    Virtual Visit No    Medication changes reported     No    Fall or balance concerns reported    No    Tobacco Cessation No Change    Warm-up and Cool-down Performed as group-led instruction    Resistance Training Performed Yes    VAD Patient? No    PAD/SET Patient? No      Pain Assessment   Currently in Pain? No/denies    Pain Score 0-No pain    Multiple Pain Sites No             Capillary Blood Glucose: No results found for this or any previous visit (from the past 24 hour(s)).    Social History   Tobacco Use  Smoking Status Former   Packs/day: 1.00   Years: 5.00   Total pack years: 5.00   Types: Cigarettes   Quit date: 09/22/1989   Years since quitting: 32.5  Smokeless Tobacco Former   Types: Snuff   Quit date: 09/22/1989  Tobacco Comments   dipped snuff for one year    Goals Met:  Independence with exercise equipment Exercise tolerated well No report of concerns or symptoms today Strength training completed today  Goals Unmet:  Not Applicable  Comments: Check out 1030.   Dr. Carlyle Dolly is Medical Director for [facility]

## 2022-04-09 ENCOUNTER — Encounter (HOSPITAL_COMMUNITY)
Admission: RE | Admit: 2022-04-09 | Discharge: 2022-04-09 | Disposition: A | Discharge status: Home / Self Care | Payer: Medicaid Other | Source: Ambulatory Visit | Attending: Cardiology | Admitting: Cardiology

## 2022-04-09 DIAGNOSIS — I5022 Chronic systolic (congestive) heart failure: Secondary | ICD-10-CM | POA: Diagnosis not present

## 2022-04-09 NOTE — Progress Notes (Signed)
Cardiac Individual Treatment Plan  Patient Details  Name: Michael Vaughn MRN: [id number] Date of Birth: 22-Aug-1961 Referring Provider:   [name] from 02/13/2022 in [facility] CARDIAC REHABILITATION  Referring Provider Dr. Clydene Pugh       Initial Encounter Date:  [name] from 02/13/2022 in [place] CARDIAC REHABILITATION  Date 02/13/22       Visit Diagnosis: Chronic systolic heart failure (HCC)  Patient's Home Medications on Admission:  Current Outpatient Medications:    allopurinol (ZYLOPRIM) 300 MG tablet, Take 300 mg by mouth daily., Disp: , Rfl:    aspirin 325 MG tablet, Take 325 mg by mouth daily., Disp: , Rfl:    finasteride (PROSCAR) 5 MG tablet, Take 5 mg by mouth daily., Disp: , Rfl:    fluticasone (FLONASE) 50 MCG/ACT nasal spray, Place 1 spray into both nostrils daily as needed for allergies., Disp: , Rfl:    glipiZIDE (GLUCOTROL XL) 10 MG 24 hr tablet, Take 10 mg by mouth daily.  , Disp: , Rfl:    HYDROcodone-acetaminophen (NORCO/VICODIN) 5-325 MG tablet, Take 1 tablet by mouth every 12 (twelve) hours as needed for moderate pain., Disp: , Rfl:    insulin glargine (LANTUS) 100 UNIT/ML injection, Inject 24 Units into the skin daily., Disp: , Rfl:    magnesium oxide (MAG-OX) 400 MG tablet, Take 400 mg by mouth daily., Disp: , Rfl:    meclizine (ANTIVERT) 25 MG tablet, Take 1 tablet by mouth 3 (three) times daily as needed for dizziness., Disp: , Rfl:    metFORMIN (GLUCOPHAGE) 1000 MG tablet, Take 1,000 mg by mouth 2 (two) times daily with a meal., Disp: , Rfl:    metoprolol succinate (TOPROL-XL) 50 MG 24 hr tablet, Take 50 mg by mouth daily., Disp: , Rfl:    Multiple Vitamin (MULTI-VITAMINS) TABS, Take 1 tablet daily by mouth., Disp: , Rfl:    nitroGLYCERIN (NITROSTAT) 0.4 MG SL tablet, Place 0.4 mg under the tongue., Disp: , Rfl:    omega-3 acid ethyl esters (LOVAZA) 1 g capsule, Take 1 g daily  by mouth., Disp: , Rfl:    Polyethyl Glycol-Propyl Glycol (SYSTANE) 0.4-0.3 % SOLN, Place 1 application. into both eyes daily as needed (Dry eyes)., Disp: , Rfl:    rosuvastatin (CRESTOR) 40 MG tablet, Take 40 mg by mouth daily., Disp: , Rfl:    sacubitril-valsartan (ENTRESTO) 24-26 MG, Take 1 tablet by mouth 2 (two) times daily., Disp: , Rfl:    senna-docusate (SENOKOT-S) 8.6-50 MG tablet, Take 2 tablets by mouth 2 (two) times daily as needed for mild constipation or moderate constipation., Disp: , Rfl:    spironolactone (ALDACTONE) 25 MG tablet, Take 12.5 mg daily by mouth., Disp: , Rfl:    tadalafil (CIALIS) 5 MG tablet, Take 5 mg by mouth daily., Disp: , Rfl:    tamsulosin (FLOMAX) 0.4 MG CAPS capsule, Take 0.4 mg by mouth daily., Disp: , Rfl:    torsemide (DEMADEX) 20 MG tablet, Take 60 mg by mouth daily., Disp: , Rfl:   Past Medical History: Past Medical History:  Diagnosis Date   Anginal pain (HCC)    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   Diabetes mellitus    Dyslipidemia    Dysrhythmia    Ejection fraction    Improved from the past  /  ejection fraction 50%, echo, July, 2012, hypokinesis at the base of the inferior wall.   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         Capillary Blood Glucose: Lab Results  Component Value Date   GLUCAP 174 (H) 06/28/2018   GLUCAP 136 (H) 06/18/2018   GLUCAP 194 (H) 08/13/2017   GLUCAP 203 (H) 08/13/2017   GLUCAP 184 (H) 08/05/2017    POCT Glucose     Row Name 02/13/22 0818             POCT Blood Glucose   Pre-Exercise 144 mg/dL                Exercise Target Goals: Exercise Program Goal: Individual exercise prescription set using results from initial 6 min walk test and THRR while considering  patient's activity barriers and safety.   Exercise Prescription Goal: Starting with aerobic activity 30 plus minutes a day, 3 days per week for initial exercise prescription. Provide home exercise prescription and guidelines that participant acknowledges understanding prior to discharge.  Activity Barriers & Risk Stratification:  Activity Barriers & Cardiac Risk Stratification - 02/13/22 0857       Activity Barriers & Cardiac Risk Stratification   Activity Barriers Back Problems;Joint Problems;Deconditioning;Decreased Ventricular Function    Cardiac Risk Stratification High             6 Minute Walk:  6 Minute Walk     Row Name 02/13/22 0922         6 Minute Walk   Phase Initial     Distance 1400 feet     Walk Time 6 minutes     # of Rest Breaks 0     MPH 2.65     METS 2.84     RPE 11     VO2 Peak 9.94     Symptoms No      Resting HR 76 bpm     Resting BP 110/62     Resting Oxygen Saturation  98 %     Exercise Oxygen Saturation  during  6 min walk 98 %     Max Ex. HR 95 bpm     Max Ex. BP 120/60     2 Minute Post BP 116/60              Oxygen Initial Assessment:   Oxygen Re-Evaluation:   Oxygen Discharge (Final Oxygen Re-Evaluation):   Initial Exercise Prescription:  Initial Exercise Prescription - 02/13/22 0900       Date of Initial Exercise RX and Referring Provider   Date 02/13/22    Referring Provider Dr. Ula Lingo    Expected Discharge Date 05/12/22      Treadmill   MPH 1.6    Grade 0    Minutes 17      NuStep   Level 1    SPM 70    Minutes 22      Prescription Details   Frequency (times per week) 3    Duration Progress to 30 minutes of continuous aerobic without signs/symptoms of physical distress      Intensity   THRR 40-80% of Max Heartrate 64-128    Ratings of Perceived Exertion 11-13      Resistance Training   Training Prescription Yes    Weight 4    Reps 10-15             Perform Capillary Blood Glucose checks as needed.  Exercise Prescription Changes:   Exercise Prescription Changes     Row Name 02/24/22 1500 03/10/22 1100 03/19/22 1000 03/21/22 1045 04/07/22 1000     Response to Exercise   Blood Pressure (Admit) 118/70 112/70 -- 120/60 102/62   Blood Pressure (Exercise) 128/62 126/62 -- 132/70 120/58   Blood Pressure (Exit) 112/60 100/72 -- 102/64 100/64   Heart Rate (Admit) 93 bpm 82 bpm -- 80 bpm 81 bpm   Heart Rate (Exercise) 107 bpm 124 bpm -- 121 bpm 118 bpm   Heart Rate (Exit) 102 bpm 104 bpm -- 88 bpm 90 bpm   Rating of Perceived Exertion (Exercise) 12 12 -- 12 12   Duration Continue with 30 min of aerobic exercise without signs/symptoms of physical distress. Continue with 30 min of aerobic exercise without signs/symptoms of physical distress. -- Continue with 30 min of aerobic exercise without signs/symptoms of physical distress. Continue  with 30 min of aerobic exercise without signs/symptoms of physical distress.   Intensity THRR unchanged THRR unchanged -- THRR unchanged THRR unchanged     Progression   Progression Continue to progress workloads to maintain intensity without signs/symptoms of physical distress. Continue to progress workloads to maintain intensity without signs/symptoms of physical distress. -- Continue to progress workloads to maintain intensity without signs/symptoms of physical distress. Continue to progress workloads to maintain intensity without signs/symptoms of physical distress.     Resistance Training   Training Prescription Yes Yes -- Yes Yes   Weight 4 5 -- 5 5   Reps 10-15 10-15 -- 10-15 10-15   Time 10 Minutes 10 Minutes -- 10 Minutes 10 Minutes     Treadmill   MPH 1.8 2.2 -- 2 2   Grade 0 0 -- 0 0   Minutes 17 22 -- 22 22   METs 2.38 2.69 -- 2.53 2.53     NuStep   Level 1 2 -- 3 3   SPM 117 126 -- 120 119   Minutes 22 17 -- 17 17   METs 1.88 2.54 -- 2.62 2.56     Home Exercise Plan   Plans  to continue exercise at -- -- Home (comment) -- --   Frequency -- -- Add 2 additional days to program exercise sessions. -- --   Initial Home Exercises Provided -- -- 03/19/22 -- --            Exercise Comments:   Exercise Comments     Row Name 03/19/22 1001           Exercise Comments home exercised reviewed                Exercise Goals and Review:   Exercise Goals     Row Name 02/13/22 1610 03/10/22 1102 04/07/22 1045         Exercise Goals   Increase Physical Activity Yes Yes Yes     Intervention Develop an individualized exercise prescription for aerobic and resistive training based on initial evaluation findings, risk stratification, comorbidities and participant's personal goals.;Provide advice, education, support and counseling about physical activity/exercise needs. Develop an individualized exercise prescription for aerobic and resistive training based on initial  evaluation findings, risk stratification, comorbidities and participant's personal goals.;Provide advice, education, support and counseling about physical activity/exercise needs. Develop an individualized exercise prescription for aerobic and resistive training based on initial evaluation findings, risk stratification, comorbidities and participant's personal goals.;Provide advice, education, support and counseling about physical activity/exercise needs.     Expected Outcomes Short Term: Attend rehab on a regular basis to increase amount of physical activity.;Long Term: Exercising regularly at least 3-5 days a week.;Long Term: Add in home exercise to make exercise part of routine and to increase amount of physical activity. Short Term: Attend rehab on a regular basis to increase amount of physical activity.;Long Term: Exercising regularly at least 3-5 days a week.;Long Term: Add in home exercise to make exercise part of routine and to increase amount of physical activity. Short Term: Attend rehab on a regular basis to increase amount of physical activity.;Long Term: Exercising regularly at least 3-5 days a week.;Long Term: Add in home exercise to make exercise part of routine and to increase amount of physical activity.     Increase Strength and Stamina Yes Yes Yes     Intervention Provide advice, education, support and counseling about physical activity/exercise needs.;Develop an individualized exercise prescription for aerobic and resistive training based on initial evaluation findings, risk stratification, comorbidities and participant's personal goals. Provide advice, education, support and counseling about physical activity/exercise needs.;Develop an individualized exercise prescription for aerobic and resistive training based on initial evaluation findings, risk stratification, comorbidities and participant's personal goals. Provide advice, education, support and counseling about physical activity/exercise  needs.;Develop an individualized exercise prescription for aerobic and resistive training based on initial evaluation findings, risk stratification, comorbidities and participant's personal goals.     Expected Outcomes Short Term: Perform resistance training exercises routinely during rehab and add in resistance training at home;Short Term: Increase workloads from initial exercise prescription for resistance, speed, and METs.;Long Term: Improve cardiorespiratory fitness, muscular endurance and strength as measured by increased METs and functional capacity ( ) Short Term: Perform resistance training exercises routinely during rehab and add in resistance training at home;Short Term: Increase workloads from initial exercise prescription for resistance, speed, and METs.;Long Term: Improve cardiorespiratory fitness, muscular endurance and strength as measured by increased METs and functional capacity ( ) Short Term: Perform resistance training exercises routinely during rehab and add in resistance training at home;Short Term: Increase workloads from initial exercise prescription for resistance, speed, and METs.;Long Term: Improve cardiorespiratory fitness, muscular endurance and strength as  measured by increased METs and functional capacity ( )     Able to understand and use rate of perceived exertion (RPE) scale Yes Yes Yes     Intervention Provide education and explanation on how to use RPE scale Provide education and explanation on how to use RPE scale Provide education and explanation on how to use RPE scale     Expected Outcomes Short Term: Able to use RPE daily in rehab to express subjective intensity level;Long Term:  Able to use RPE to guide intensity level when exercising independently Short Term: Able to use RPE daily in rehab to express subjective intensity level;Long Term:  Able to use RPE to guide intensity level when exercising independently Short Term: Able to use RPE daily in rehab to express  subjective intensity level;Long Term:  Able to use RPE to guide intensity level when exercising independently     Knowledge and understanding of Target Heart Rate Range (THRR) Yes Yes Yes     Intervention Provide education and explanation of THRR including how the numbers were predicted and where they are located for reference Provide education and explanation of THRR including how the numbers were predicted and where they are located for reference Provide education and explanation of THRR including how the numbers were predicted and where they are located for reference     Expected Outcomes Short Term: Able to state/look up THRR;Long Term: Able to use THRR to govern intensity when exercising independently;Short Term: Able to use daily as guideline for intensity in rehab Short Term: Able to state/look up THRR;Long Term: Able to use THRR to govern intensity when exercising independently;Short Term: Able to use daily as guideline for intensity in rehab Short Term: Able to state/look up THRR;Long Term: Able to use THRR to govern intensity when exercising independently;Short Term: Able to use daily as guideline for intensity in rehab     Able to check pulse independently Yes Yes Yes     Intervention Provide education and demonstration on how to check pulse in carotid and radial arteries.;Review the importance of being able to check your own pulse for safety during independent exercise Provide education and demonstration on how to check pulse in carotid and radial arteries.;Review the importance of being able to check your own pulse for safety during independent exercise Provide education and demonstration on how to check pulse in carotid and radial arteries.;Review the importance of being able to check your own pulse for safety during independent exercise     Expected Outcomes Short Term: Able to explain why pulse checking is important during independent exercise;Long Term: Able to check pulse independently and  accurately Short Term: Able to explain why pulse checking is important during independent exercise;Long Term: Able to check pulse independently and accurately Short Term: Able to explain why pulse checking is important during independent exercise;Long Term: Able to check pulse independently and accurately     Understanding of Exercise Prescription Yes Yes Yes     Intervention Provide education, explanation, and written materials on patient's individual exercise prescription Provide education, explanation, and written materials on patient's individual exercise prescription Provide education, explanation, and written materials on patient's individual exercise prescription     Expected Outcomes Short Term: Able to explain program exercise prescription;Long Term: Able to explain home exercise prescription to exercise independently Short Term: Able to explain program exercise prescription;Long Term: Able to explain home exercise prescription to exercise independently Short Term: Able to explain program exercise prescription;Long Term: Able to explain home exercise prescription  to exercise independently              Exercise Goals Re-Evaluation :  Exercise Goals Re-Evaluation     Row Name 03/10/22 1103 04/07/22 1045           Exercise Goal Re-Evaluation   Exercise Goals Review Increase Physical Activity;Increase Strength and Stamina;Able to understand and use rate of perceived exertion (RPE) scale;Knowledge and understanding of Target Heart Rate Range (THRR);Able to check pulse independently;Understanding of Exercise Prescription Increase Physical Activity;Increase Strength and Stamina;Able to understand and use rate of perceived exertion (RPE) scale;Knowledge and understanding of Target Heart Rate Range (THRR);Able to check pulse independently;Understanding of Exercise Prescription      Comments Pt has completed 9 sessions of CR. He is motivated to improve himself and he has been able to progress his  workloads. He is currently exercising at 2.69 METs on the treadmill. Will continue to monitor and progress as able. Pt has completed 18 sessions of cardiac rehab. He continues to be motivated during class and progressing his workloads. He is currently exercising at 2.56 METs on the stepper. Will continue to monitor and progress as able.      Expected Outcomes Through exercise at rehab and at home, pt will meet their stated goals. Through exercise at rehab and at home, pt will meet their stated goals.                Discharge Exercise Prescription (Final Exercise Prescription Changes):  Exercise Prescription Changes - 04/07/22 1000       Response to Exercise   Blood Pressure (Admit) 102/62    Blood Pressure (Exercise) 120/58    Blood Pressure (Exit) 100/64    Heart Rate (Admit) 81 bpm    Heart Rate (Exercise) 118 bpm    Heart Rate (Exit) 90 bpm    Rating of Perceived Exertion (Exercise) 12    Duration Continue with 30 min of aerobic exercise without signs/symptoms of physical distress.    Intensity THRR unchanged      Progression   Progression Continue to progress workloads to maintain intensity without signs/symptoms of physical distress.      Resistance Training   Training Prescription Yes    Weight 5    Reps 10-15    Time 10 Minutes      Treadmill   MPH 2    Grade 0    Minutes 22    METs 2.53      NuStep   Level 3    SPM 119    Minutes 17    METs 2.56             Nutrition:  Target Goals: Understanding of nutrition guidelines, daily intake of sodium 1500mg , cholesterol 200mg , calories 30% from fat and 7% or less from saturated fats, daily to have 5 or more servings of fruits and vegetables.  Biometrics:  Pre Biometrics - 02/13/22 0927       Pre Biometrics   Height 5\' 11"  (1.803 m)    Weight 125.3 kg    Waist Circumference 52 inches    Hip Circumference 46 inches    Waist to Hip Ratio 1.13 %    BMI (Calculated) 38.54    Triceps Skinfold 15 mm    %  Body Fat 36.6 %    Grip Strength 36.9 kg    Flexibility 0 in    Single Leg Stand 23 seconds  Nutrition Therapy Plan and Nutrition Goals:  Nutrition Therapy & Goals - 03/03/22 0913       Personal Nutrition Goals   Comments Patient scored 24 on his diet assessment. We offer 2 educational sessions on heart healthy nutrition with handouts and assistance with RD referral if patient is interested.      Intervention Plan   Intervention Nutrition handout(s) given to patient.    Expected Outcomes Short Term Goal: Understand basic principles of dietary content, such as calories, fat, sodium, cholesterol and nutrients.             Nutrition Assessments:  Nutrition Assessments - 02/13/22 0831       MEDFICTS Scores   Pre Score 24            MEDIFICTS Score Key: ?70 Need to make dietary changes  40-70 Heart Healthy Diet ? 40 Therapeutic Level Cholesterol Diet   Picture Your Plate Scores: <26 Unhealthy dietary pattern with much room for improvement. 41-50 Dietary pattern unlikely to meet recommendations for good health and room for improvement. 51-60 More healthful dietary pattern, with some room for improvement.  >60 Healthy dietary pattern, although there may be some specific behaviors that could be improved.    Nutrition Goals Re-Evaluation:   Nutrition Goals Discharge (Final Nutrition Goals Re-Evaluation):   Psychosocial: Target Goals: Acknowledge presence or absence of significant depression and/or stress, maximize coping skills, provide positive support system. Participant is able to verbalize types and ability to use techniques and skills needed for reducing stress and depression.  Initial Review & Psychosocial Screening:  Initial Psych Review & Screening - 02/13/22 0851       Initial Review   Current issues with None Identified      Family Dynamics   Good Support System? Yes    Comments His support system is his sister.      Barriers    Psychosocial barriers to participate in program There are no identifiable barriers or psychosocial needs.      Screening Interventions   Interventions Encouraged to exercise    Expected Outcomes Long Term goal: The participant improves quality of Life and PHQ9 Scores as seen by post scores and/or verbalization of changes;Short Term goal: Identification and review with participant of any Quality of Life or Depression concerns found by scoring the questionnaire.             Quality of Life Scores:  Quality of Life - 02/13/22 0927       Quality of Life   Select Quality of Life      Quality of Life Scores   Health/Function Pre 18.4 %    Socioeconomic Pre 20.57 %    Psych/Spiritual Pre 24.86 %    Family Pre 22.8 %    GLOBAL Pre 20.82 %            Scores of 19 and below usually indicate a poorer quality of life in these areas.  A difference of  2-3 points is a clinically meaningful difference.  A difference of 2-3 points in the total score of the Quality of Life Index has been associated with significant improvement in overall quality of life, self-image, physical symptoms, and general health in studies assessing change in quality of life.  PHQ-9: Review Flowsheet       02/13/2022 08/06/2018  Depression screen PHQ 2/9  Decreased Interest 0 0  Down, Depressed, Hopeless 0 0  PHQ - 2 Score 0 0  Altered sleeping 1 1  Tired, decreased energy 3 1  Change in appetite 0 0  Feeling bad or failure about yourself  0 0  Trouble concentrating 0 0  Moving slowly or fidgety/restless 0 0  Suicidal thoughts 0 -  PHQ-9 Score 4 2  Difficult doing work/chores Somewhat difficult -   Interpretation of Total Score  Total Score Depression Severity:  1-4 = Minimal depression, 5-9 = Mild depression, 10-14 = Moderate depression, 15-19 = Moderately severe depression, 20-27 = Severe depression   Psychosocial Evaluation and Intervention:  Psychosocial Evaluation - 02/13/22 0918        Psychosocial Evaluation & Interventions   Interventions Encouraged to exercise with the program and follow exercise prescription    Comments Pt has no barriers to paricipate in CR. He has no identifiable psychosocial issues. He scored a 4 on his PHQ-9 and he relates this to his heart condition. He feels like he never has any energy. This has worsened over the past few months. He was previously exercising multiple days per week at the [facility], but then he began having problems with fluid retention. This has since been taken care of, but [facility] He reports that his sister is his main support system. He is seperated from his wife, but this was not something that he brought up or spoke about at all. His goals while in the program are to lose weight and to regain his strength and stamina. He has a positive outlook and is eager to start the program.    Expected Outcomes Pt will continue to have no identifiable psychosocial issues.    Continue Psychosocial Services  No Follow up required             Psychosocial Re-Evaluation:  Psychosocial Re-Evaluation     Row Name 03/03/22 0915 03/31/22 0755           Psychosocial Re-Evaluation   Current issues with None Identified None Identified      Comments Patient is new to the program completing 5 sessions. He continues to have no psychosocial barriers identified. He enjoys coming to the sessions and demonstrates an interest in improving his health. We will continue to monitor. Patient has completed 14 sessions. He continues to have no psychosocial barriers identified. He continues to enjoy coming to the sessions and demonstrates an interest in improving his health. We will continue to monitor.      Expected Outcomes Patient will not have any psychosocial issues identified at dishcharge.  Patient will not have any psychosocial issues identified at dishcharge.       Interventions Stress management education;Relaxation  education;Encouraged to attend Cardiac Rehabilitation for the exercise Stress management education;Relaxation education;Encouraged to attend Cardiac Rehabilitation for the exercise      Continue Psychosocial Services  No Follow up required No Follow up required               Psychosocial Discharge (Final Psychosocial Re-Evaluation):  Psychosocial Re-Evaluation - 03/31/22 0755       Psychosocial Re-Evaluation   Current issues with None Identified    Comments Patient has completed 14 sessions. He continues to have no psychosocial barriers identified. He continues to enjoy coming to the sessions and demonstrates an interest in improving his health. We will continue to monitor.    Expected Outcomes Patient will not have any psychosocial issues identified at dishcharge.     Interventions Stress management education;Relaxation education;Encouraged to attend Cardiac Rehabilitation for the exercise    Continue  Psychosocial Services  No Follow up required             Vocational Rehabilitation: Provide vocational rehab assistance to qualifying candidates.   Vocational Rehab Evaluation & Intervention:  Vocational Rehab - 02/13/22 0829       Initial Vocational Rehab Evaluation & Intervention   Assessment shows need for Vocational Rehabilitation No      Vocational Rehab Re-Evaulation   Comments He is disabled and not going back to work.             Education: Education Goals: Education classes will be provided on a weekly basis, covering required topics. Participant will state understanding/return demonstration of topics presented.  Learning Barriers/Preferences:  Learning Barriers/Preferences - 02/13/22 1610       Learning Barriers/Preferences   Learning Barriers None    Learning Preferences Group Instruction;Individual Instruction;Skilled Demonstration;Written Material             Education Topics: Hypertension, Hypertension Reduction -Define heart disease and  high blood pressure. Discus how high blood pressure affects the body and ways to reduce high blood pressure. Flowsheet Row CARDIAC REHAB PHASE II EXERCISE from 08/04/2018 in [place] CARDIAC REHABILITATION  Date 07/28/18  Educator D. Coad  Instruction Review Code 2- Demonstrated Understanding       Exercise and Your Heart -Discuss why it is important to exercise, the FITT principles of exercise, normal and abnormal responses to exercise, and how to exercise safely. Flowsheet Row CARDIAC REHAB PHASE II EXERCISE from 04/02/2022 in [place] CARDIAC REHABILITATION  Date 04/02/22  Educator HJ  Instruction Review Code 1- Verbalizes Understanding       Angina -Discuss definition of angina, causes of angina, treatment of angina, and how to decrease risk of having angina.   Cardiac Medications -Review what the following cardiac medications are used for, how they affect the body, and side effects that may occur when taking the medications.  Medications include Aspirin, Beta blockers, calcium channel blockers, ACE Inhibitors, angiotensin receptor blockers, diuretics, digoxin, and antihyperlipidemics. Flowsheet Row CARDIAC REHAB PHASE II EXERCISE from 08/04/2018 in [place] CARDIAC REHABILITATION  Date 05/19/18  Educator Timoteo Expose  Instruction Review Code 2- Demonstrated Understanding       Congestive Heart Failure -Discuss the definition of CHF, how to live with CHF, the signs and symptoms of CHF, and how keep track of weight and sodium intake. Flowsheet Row CARDIAC REHAB PHASE II EXERCISE from 08/04/2018 in [place] CARDIAC REHABILITATION  Date 05/26/18  Educator D. Coad   Instruction Review Code 2- Demonstrated Understanding       Heart Disease and Intimacy -Discus the effect sexual activity has on the heart, how changes occur during intimacy as we age, and safety during sexual activity. Flowsheet Row CARDIAC REHAB PHASE II EXERCISE from 08/04/2018 in [place] CARDIAC  REHABILITATION  Date 06/02/18  Educator Timoteo Expose  Instruction Review Code 2- Demonstrated Understanding       Smoking Cessation / COPD -Discuss different methods to quit smoking, the health benefits of quitting smoking, and the definition of COPD. Flowsheet Row CARDIAC REHAB PHASE II EXERCISE from 08/04/2018 in [place] CARDIAC REHABILITATION  Date 06/09/18  Educator Timoteo Expose  Instruction Review Code 2- Demonstrated Understanding       Nutrition I: Fats -Discuss the types of cholesterol, what cholesterol does to the heart, and how cholesterol levels can be controlled. Flowsheet Row CARDIAC REHAB PHASE II EXERCISE from 08/04/2018 in [place] CARDIAC REHABILITATION  Date 06/16/18  Educator D. Coad  Instruction Review Code 2- Demonstrated Understanding       Nutrition II: Labels -Discuss the different components of food labels and how to read food label Flowsheet Row CARDIAC REHAB PHASE II EXERCISE from 08/04/2018 in [place] CARDIAC REHABILITATION  Date 06/23/18  Educator Timoteo Expose  Instruction Review Code 2- Demonstrated Understanding       Heart Parts/Heart Disease and PAD -Discuss the anatomy of the heart, the pathway of blood circulation through the heart, and these are affected by heart disease. Flowsheet Row CARDIAC REHAB PHASE II EXERCISE from 04/02/2022 in [place] CARDIAC REHABILITATION  Date 03/05/22  Educator HJ  Instruction Review Code 2- Demonstrated Understanding       Stress I: Signs and Symptoms -Discuss the causes of stress, how stress may lead to anxiety and depression, and ways to limit stress. Flowsheet Row CARDIAC REHAB PHASE II EXERCISE from 04/02/2022 in [place] CARDIAC REHABILITATION  Date 03/12/22  Educator hj  Instruction Review Code 2- Demonstrated Understanding       Stress II: Relaxation -Discuss different types of relaxation techniques to limit stress. Flowsheet Row CARDIAC REHAB PHASE II EXERCISE from 04/02/2022 in  [place] CARDIAC REHABILITATION  Date 03/19/22  Educator HJ  Instruction Review Code 1- Verbalizes Understanding       Warning Signs of Stroke / TIA -Discuss definition of a stroke, what the signs and symptoms are of a stroke, and how to identify when someone is having stroke.   Knowledge Questionnaire Score:  Knowledge Questionnaire Score - 02/13/22 0853       Knowledge Questionnaire Score   Pre Score 20/24             Core Components/Risk Factors/Patient Goals at Admission:  Personal Goals and Risk Factors at Admission - 02/13/22 0855       Core Components/Risk Factors/Patient Goals on Admission    Weight Management Yes;Obesity;Weight Loss    Intervention Weight Management: Develop a combined nutrition and exercise program designed to reach desired caloric intake, while maintaining appropriate intake of nutrient and fiber, sodium and fats, and appropriate energy expenditure required for the weight goal.;Weight Management: Provide education and appropriate resources to help participant work on and attain dietary goals.;Weight Management/Obesity: Establish reasonable short term and long term weight goals.;Obesity: Provide education and appropriate resources to help participant work on and attain dietary goals.    Expected Outcomes Short Term: Continue to assess and modify interventions until short term weight is achieved;Long Term: Adherence to nutrition and physical activity/exercise program aimed toward attainment of established weight goal;Weight Maintenance: Understanding of the daily nutrition guidelines, which includes 25-35% calories from fat, 7% or less cal from saturated fats, less than  cholesterol, less than 1.5gm of sodium, & 5 or more servings of fruits and vegetables daily;Weight Loss: Understanding of general recommendations for a balanced deficit meal plan, which promotes 1-2 lb weight loss per week and includes a negative energy balance of [phone]  kcal/d;Understanding recommendations for meals to include 15-35% energy as protein, 25-35% energy from fat, 35-60% energy from carbohydrates, less than  of dietary cholesterol, 20-35 gm of total fiber daily;Understanding of distribution of calorie intake throughout the day with the consumption of 4-5 meals/snacks    Diabetes Yes    Intervention Provide education about signs/symptoms and action to take for hypo/hyperglycemia.;Provide education about proper nutrition, including hydration, and aerobic/resistive exercise prescription along with prescribed medications to achieve blood glucose in normal ranges: Fasting glucose 65-99 mg/dL  Expected Outcomes Short Term: Participant verbalizes understanding of the signs/symptoms and immediate care of hyper/hypoglycemia, proper foot care and importance of medication, aerobic/resistive exercise and nutrition plan for blood glucose control.;Long Term: Attainment of HbA1C < 7%.    Heart Failure Yes    Intervention Provide a combined exercise and nutrition program that is supplemented with education, support and counseling about heart failure. Directed toward relieving symptoms such as shortness of breath, decreased exercise tolerance, and extremity edema.    Expected Outcomes Improve functional capacity of life;Short term: Attendance in program 2-3 days a week with increased exercise capacity. Reported lower sodium intake. Reported increased fruit and vegetable intake. Reports medication compliance.;Short term: Daily weights obtained and reported for increase. Utilizing diuretic protocols set by physician.;Long term: Adoption of self-care skills and reduction of barriers for early signs and symptoms recognition and intervention leading to self-care maintenance.    Hypertension Yes    Intervention Provide education on lifestyle modifcations including regular physical activity/exercise, weight management, moderate sodium restriction and increased consumption of  fresh fruit, vegetables, and low fat dairy, alcohol moderation, and smoking cessation.;Monitor prescription use compliance.    Expected Outcomes Short Term: Continued assessment and intervention until BP is < 140/83mm HG in hypertensive participants. < 130/31mm HG in hypertensive participants with diabetes, heart failure or chronic kidney disease.;Long Term: Maintenance of blood pressure at goal levels.    Personal Goal Other Yes    Personal Goal Increase strength and stamina    Intervention Attend CR 3 days per week and exercise at home 2-4 days per week    Expected Outcomes Pt will report improvement in his strength and stamina and his six minute walk test distance will increase.             Core Components/Risk Factors/Patient Goals Review:   Goals and Risk Factor Review     Row Name 03/03/22 0916 03/31/22 0756 03/31/22 1229         Core Components/Risk Factors/Patient Goals Review   Personal Goals Review Weight Management/Obesity;Heart Failure;Hypertension Weight Management/Obesity;Heart Failure;Hypertension --     Review Patient was referred to CR with chronic systolic HF. He has multiple risk factors for CAD and is participating in the program for risk modification.  He has complete 5 sessions. His current weight is 278 up 11 lbs from his initial visit. He saw his cardiologist at [facility] 6/6 for HF management. No changes were made. His last A1C from those notes was 8.8%. He is on Metformin for DM management. His home glucose readings average 170 g/dl. His blood pressue is at goal. He reported to his cardiologist he was having less SOB and is feeling stronger. His personal goals for the program are to lose weight and improve his strength and stamina. We will continue to monitor his progress as he works towards meeting these goals. Patient has completed 14 sessions. His current weight is 273.7 lbs up 6.7 lbs from last 30 day review. He presented to the ED 7/3 with chest pain and increased  SOB. All test were unremarkable and he was referred to his cardiologist. He has to be cleared by his cardiologist to return to CR. 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 He saw his endocrinologist 7/7 with A1C at 9.0%. He made changes to his insulin changing to Lantus. He is also on Jardiance, Januvia, Meftormin and Glipizide for DM control. His blood pressures have been at  goal. Patient reported to the ED MD that he feels like he is retaining fluid but MD did not feel like he was in acute HF. Patient's attendance has been consistent with progressions. His perosnal goals continue to be to lose weight and improve his strenght and stamina. We will continue to monitor his progress when he returns to the program after being evaluated by cardiologist. Patient was cleared to return to CR by his cardiologist at [facility]. We received written notification. He returned today 03/31/22.     Expected Outcomes Patient will complet the program meeting both personal and program goals. Patient will complet the program meeting both personal and program goals. Patient will complet the program meeting both personal and program goals.              Core Components/Risk Factors/Patient Goals at Discharge (Final Review):   Goals and Risk Factor Review - 03/31/22 1229       Core Components/Risk Factors/Patient Goals Review   Review Patient was cleared to return to CR by his cardiologist at [facility]. We received written notification. He returned today 03/31/22.    Expected Outcomes Patient will complet the program meeting both personal and program goals.             ITP Comments:   Comments: ITP REVIEW Pt is making expected progress toward Cardiac Rehab goals after completing 18 sessions. Recommend continued exercise, life style modification, education, and increased stamina and strength.

## 2022-04-09 NOTE — Progress Notes (Signed)
Daily Session Note  Patient Details  Name: Michael Vaughn MRN: [id number] Date of Birth: Aug 14, 1961 Referring Provider:   [name] from 02/13/2022 in [place]  Referring Provider Dr. Ula Lingo       Encounter Date: 04/09/2022  Check In:  Session Check In - 04/09/22 0930       Check-In   Supervising physician immediately available to respond to emergencies [facility] MD immediately available    Physician(s) Dr Zandra Abts    Location [facility]    Staff Present Redge Gainer, BS, Exercise Physiologist;Dalton Kris Mouton, MS, ACSM-CEP, Exercise Physiologist;Debra Wynetta Emery, RN, Joanette Gula, RN, BSN    Virtual Visit No    Medication changes reported     No    Fall or balance concerns reported    No    Tobacco Cessation No Change    Warm-up and Cool-down Performed as group-led instruction    Resistance Training Performed Yes    VAD Patient? No    PAD/SET Patient? No      Pain Assessment   Currently in Pain? No/denies    Pain Score 0-No pain    Multiple Pain Sites No             Capillary Blood Glucose: No results found for this or any previous visit (from the past 24 hour(s)).    Social History   Tobacco Use  Smoking Status Former   Packs/day: 1.00   Years: 5.00   Total pack years: 5.00   Types: Cigarettes   Quit date: 09/22/1989   Years since quitting: 32.5  Smokeless Tobacco Former   Types: Snuff   Quit date: 09/22/1989  Tobacco Comments   dipped snuff for one year    Goals Met:  Independence with exercise equipment Exercise tolerated well No report of concerns or symptoms today Strength training completed today  Goals Unmet:  Not Applicable  Comments: checkout at 1030.   Dr. Carlyle Dolly is Medical Director for [facility] Cardiac Rehab

## 2022-04-11 ENCOUNTER — Encounter (HOSPITAL_COMMUNITY)
Admission: RE | Admit: 2022-04-11 | Discharge: 2022-04-11 | Disposition: A | Discharge status: Home / Self Care | Payer: Medicaid Other | Source: Ambulatory Visit | Attending: Cardiology | Admitting: Cardiology

## 2022-04-11 DIAGNOSIS — I5022 Chronic systolic (congestive) heart failure: Secondary | ICD-10-CM | POA: Diagnosis not present

## 2022-04-11 NOTE — Progress Notes (Signed)
Daily Session Note  Patient Details  Name: OSCEOLA DEPAZ MRN: [id number] Date of Birth: [birth year] Referring Provider:   [name] from 02/13/2022 in [place]  Referring Provider Dr. Ula Lingo       Encounter Date: 04/11/2022  Check In:  Session Check In - 04/11/22 0928       Check-In   Supervising physician immediately available to respond to emergencies [facility] MD immediately available    Physician(s) Dr Zandra Abts    Location [facility]    Staff Present Redge Gainer, BS, Exercise Physiologist;Dalton Kris Mouton, MS, ACSM-CEP, Exercise Physiologist;Debra Wynetta Emery, RN, Joanette Gula, RN, BSN    Virtual Visit No    Medication changes reported     No    Fall or balance concerns reported    No    Tobacco Cessation No Change    Warm-up and Cool-down Performed as group-led instruction    Resistance Training Performed Yes    VAD Patient? No    PAD/SET Patient? No      Pain Assessment   Currently in Pain? No/denies    Pain Score 0-No pain    Multiple Pain Sites No             Capillary Blood Glucose: No results found for this or any previous visit (from the past 24 hour(s)).    Social History   Tobacco Use  Smoking Status Former   Packs/day: 1.00   Years: 5.00   Total pack years: 5.00   Types: Cigarettes   Quit date: 09/22/1989   Years since quitting: 32.5  Smokeless Tobacco Former   Types: Snuff   Quit date: 09/22/1989  Tobacco Comments   dipped snuff for one year    Goals Met:  Independence with exercise equipment Exercise tolerated well No report of concerns or symptoms today Strength training completed today  Goals Unmet:  Not Applicable  Comments: Checkout at 1030.   Dr. Carlyle Dolly is Medical Director for [facility] Cardiac Rehab

## 2022-04-14 ENCOUNTER — Encounter (HOSPITAL_COMMUNITY)
Admission: RE | Admit: 2022-04-14 | Discharge: 2022-04-14 | Disposition: A | Discharge status: Home / Self Care | Payer: Medicaid Other | Source: Ambulatory Visit | Attending: Cardiology | Admitting: Cardiology

## 2022-04-14 DIAGNOSIS — I5022 Chronic systolic (congestive) heart failure: Secondary | ICD-10-CM

## 2022-04-14 NOTE — Progress Notes (Signed)
Daily Session Note  Patient Details  Name: Michael Vaughn MRN: [id number] Date of Birth: 02/27/1961 Referring Provider:   [name] from 02/13/2022 in [place]  Referring Provider Dr. Ula Lingo       Encounter Date: 04/14/2022  Check In:  Session Check In - 04/14/22 0930       Check-In   Supervising physician immediately available to respond to emergencies [facility] MD immediately available    Physician(s) Dr Domenic Polite    Location [facility]    Staff Present Hoy Register, MS, ACSM-CEP, Exercise Physiologist;Debra Wynetta Emery, RN, Joanette Gula, RN, BSN    Virtual Visit No    Medication changes reported     No    Fall or balance concerns reported    No    Tobacco Cessation No Change    Warm-up and Cool-down Performed as group-led instruction    Resistance Training Performed Yes    VAD Patient? No    PAD/SET Patient? No      Pain Assessment   Currently in Pain? No/denies    Pain Score 0-No pain    Multiple Pain Sites No             Capillary Blood Glucose: No results found for this or any previous visit (from the past 24 hour(s)).    Social History   Tobacco Use  Smoking Status Former   Packs/day: 1.00   Years: 5.00   Total pack years: 5.00   Types: Cigarettes   Quit date: 09/22/1989   Years since quitting: 32.5  Smokeless Tobacco Former   Types: Snuff   Quit date: 09/22/1989  Tobacco Comments   dipped snuff for one year    Goals Met:  Independence with exercise equipment Exercise tolerated well No report of concerns or symptoms today Strength training completed today  Goals Unmet:  Not Applicable  Comments: check out at 1030.   Dr. Carlyle Dolly is Medical Director for [facility] Cardiac Rehab

## 2022-04-16 ENCOUNTER — Encounter (HOSPITAL_COMMUNITY)
Admission: RE | Admit: 2022-04-16 | Discharge: 2022-04-16 | Disposition: A | Discharge status: Home / Self Care | Payer: Medicaid Other | Source: Ambulatory Visit | Attending: Cardiology | Admitting: Cardiology

## 2022-04-16 DIAGNOSIS — I5022 Chronic systolic (congestive) heart failure: Secondary | ICD-10-CM

## 2022-04-16 NOTE — Progress Notes (Signed)
Daily Session Note  Patient Details  Name: Michael Vaughn MRN: [id number] Date of Birth: Feb 03, 1961 Referring Provider:   [name] from 02/13/2022 in [place]  Referring Provider Dr. Ula Lingo       Encounter Date: 04/16/2022  Check In:  Session Check In - 04/16/22 0930       Check-In   Supervising physician immediately available to respond to emergencies [facility] MD immediately available    Physician(s) Dr Johnsie Cancel    Location [facility]    Staff Present Hoy Register, MS, ACSM-CEP, Exercise Physiologist;Shiasia Porro Hassell Done, RN, BSN    Virtual Visit No    Medication changes reported     No    Fall or balance concerns reported    No    Tobacco Cessation No Change    Warm-up and Cool-down Performed as group-led instruction    Resistance Training Performed Yes    VAD Patient? No    PAD/SET Patient? No      Pain Assessment   Currently in Pain? No/denies    Pain Score 0-No pain    Multiple Pain Sites No             Capillary Blood Glucose: No results found for this or any previous visit (from the past 24 hour(s)).    Social History   Tobacco Use  Smoking Status Former   Packs/day: 1.00   Years: 5.00   Total pack years: 5.00   Types: Cigarettes   Quit date: 09/22/1989   Years since quitting: 32.5  Smokeless Tobacco Former   Types: Snuff   Quit date: 09/22/1989  Tobacco Comments   dipped snuff for one year    Goals Met:  Independence with exercise equipment Exercise tolerated well No report of concerns or symptoms today Strength training completed today  Goals Unmet:  Not Applicable  Comments: checkout at 1030.   Dr. Carlyle Dolly is Medical Director for [facility] Cardiac Rehab

## 2022-04-18 ENCOUNTER — Encounter (HOSPITAL_COMMUNITY)
Admission: RE | Admit: 2022-04-18 | Discharge: 2022-04-18 | Disposition: A | Discharge status: Home / Self Care | Payer: Medicaid Other | Source: Ambulatory Visit | Attending: Cardiology | Admitting: Cardiology

## 2022-04-18 DIAGNOSIS — I5022 Chronic systolic (congestive) heart failure: Secondary | ICD-10-CM | POA: Diagnosis not present

## 2022-04-18 NOTE — Progress Notes (Signed)
Daily Session Note  Patient Details  Name: Michael Vaughn MRN: [id number] Date of Birth: 01/22/1961 Referring Provider:   [name] from 02/13/2022 in [place] CARDI[facility]ILITATION  Referring Provider Dr. Kalkar       Encounter Date: 04/18/2022  Check In:  Session Check In - 04/18/22 0929       Check-In   Supervising physician immediately available to respond to emergencies [facility] MD immediately available    Physician(s) Dr Chandrasekar    Location [facility]    Staff Present Dalton Fletcher, MS, ACSM-CEP, Exercise Physiologist;Daphyne Martin, RN, BSN    Virtual Visit No    Medication changes reported     No    Fall or balance concerns reported    No    Tobacco Cessation No Change    Warm-up and Cool-down Performed as group-led instruction    Resistance Training Performed Yes    VAD Patient? No    PAD/SET Patient? No      Pain Assessment   Currently in Pain? No/denies    Pain Score 0-No pain    Multiple Pain Sites No             Capillary Blood Glucose: No results found for this or any previous visit (from the past 24 hour(s)).    Social History   Tobacco Use  Smoking Status Former   Packs/day: 1.00   Years: 5.00   Total pack years: 5.00   Types: Cigarettes   Quit date: 09/22/1989   Years since quitting: 32.5  Smokeless Tobacco Former   Types: Snuff   Quit date: 09/22/1989  Tobacco Comments   dipped snuff for one year    Goals Met:  Independence with exercise equipment Exercise tolerated well No report of concerns or symptoms today Strength training completed today  Goals Unmet:  Not Applicable  Comments: checkout at 1030.   Dr. Jonathan Branch is Medical Director for [place] Card[facility] 

## 2022-04-21 ENCOUNTER — Encounter (HOSPITAL_COMMUNITY)
Admission: RE | Admit: 2022-04-21 | Discharge: 2022-04-21 | Disposition: A | Discharge status: Home / Self Care | Payer: Medicaid Other | Source: Ambulatory Visit | Attending: Cardiology | Admitting: Cardiology

## 2022-04-21 VITALS — Wt 274.9 lb

## 2022-04-21 DIAGNOSIS — I5022 Chronic systolic (congestive) heart failure: Secondary | ICD-10-CM | POA: Diagnosis not present

## 2022-04-21 NOTE — Progress Notes (Signed)
Daily Session Note  Patient Details  Name: DEMARRION MEIKLEJOHN MRN: [id number] Date of Birth: 07-Jul-1961 Referring Provider:   [name] from 02/13/2022 in [place]  Referring Provider Dr. Ula Lingo       Encounter Date: 04/21/2022  Check In:  Session Check In - 04/21/22 0930       Check-In   Supervising physician immediately available to respond to emergencies [facility] MD immediately available    Physician(s) Dr. Harrington Challenger    Location [facility]    Staff Present Redge Gainer, BS, Exercise Physiologist;Dalton Kris Mouton, MS, ACSM-CEP, Exercise Physiologist    Virtual Visit No    Medication changes reported     No    Fall or balance concerns reported    No    Tobacco Cessation No Change    Warm-up and Cool-down Performed as group-led instruction    Resistance Training Performed Yes    VAD Patient? No    PAD/SET Patient? No      Pain Assessment   Currently in Pain? No/denies    Pain Score 0-No pain    Multiple Pain Sites No             Capillary Blood Glucose: No results found for this or any previous visit (from the past 24 hour(s)).    Social History   Tobacco Use  Smoking Status Former   Packs/day: 1.00   Years: 5.00   Total pack years: 5.00   Types: Cigarettes   Quit date: 09/22/1989   Years since quitting: 32.6  Smokeless Tobacco Former   Types: Snuff   Quit date: 09/22/1989  Tobacco Comments   dipped snuff for one year    Goals Met:  Independence with exercise equipment Exercise tolerated well No report of concerns or symptoms today Strength training completed today  Goals Unmet:  Not Applicable  Comments: check out 1030   Dr. Carlyle Dolly is Medical Director for [place]

## 2022-04-23 ENCOUNTER — Encounter (HOSPITAL_COMMUNITY)
Admission: RE | Admit: 2022-04-23 | Discharge: 2022-04-23 | Disposition: A | Discharge status: Home / Self Care | Payer: Medicaid Other | Source: Ambulatory Visit | Attending: Cardiology | Admitting: Cardiology

## 2022-04-23 DIAGNOSIS — I5022 Chronic systolic (congestive) heart failure: Secondary | ICD-10-CM | POA: Insufficient documentation

## 2022-04-23 NOTE — Progress Notes (Signed)
Daily Session Note  Patient Details  Name: Michael Vaughn MRN: [id number] Date of Birth: Dec 01, 1960 Referring Provider:   [name] from 02/13/2022 in [place]  Referring Provider Dr. Ula Lingo       Encounter Date: 04/23/2022  Check In:  Session Check In - 04/23/22 0930       Check-In   Supervising physician immediately available to respond to emergencies [facility] immediately available    Physician(s) Dr. Johney Frame    Location [facility]    Staff Present Geanie Cooley, RN;Heather Otho Ket, BS, Exercise Physiologist;Daphyne Hassell Done, RN, Bjorn Loser, MS, ACSM-CEP, Exercise Physiologist    Virtual Visit No    Medication changes reported     No    Fall or balance concerns reported    No    Tobacco Cessation No Change    Warm-up and Cool-down Performed as group-led instruction    Resistance Training Performed Yes    VAD Patient? No    PAD/SET Patient? No      Pain Assessment   Currently in Pain? No/denies    Pain Score 0-No pain    Multiple Pain Sites No             Capillary Blood Glucose: No results found for this or any previous visit (from the past 24 hour(s)).    Social History   Tobacco Use  Smoking Status Former   Packs/day: 1.00   Years: 5.00   Total pack years: 5.00   Types: Cigarettes   Quit date: 09/22/1989   Years since quitting: 32.6  Smokeless Tobacco Former   Types: Snuff   Quit date: 09/22/1989  Tobacco Comments   dipped snuff for one year    Goals Met:  Independence with exercise equipment Exercise tolerated well No report of concerns or symptoms today Strength training completed today  Goals Unmet:  Not Applicable  Comments: check out @ 10:30am   Dr. Carlyle Dolly is Medical Director for [place]

## 2022-04-25 ENCOUNTER — Encounter (HOSPITAL_COMMUNITY)
Admission: RE | Admit: 2022-04-25 | Discharge: 2022-04-25 | Disposition: A | Discharge status: Home / Self Care | Payer: Medicaid Other | Source: Ambulatory Visit | Attending: Cardiology | Admitting: Cardiology

## 2022-04-25 DIAGNOSIS — I5022 Chronic systolic (congestive) heart failure: Secondary | ICD-10-CM | POA: Diagnosis not present

## 2022-04-25 NOTE — Progress Notes (Signed)
Daily Session Note  Patient Details  Name: Michael Vaughn MRN: [id number] Date of Birth: [birth year] Referring Provider:   [name] from 02/13/2022 in [place]  Referring Provider Dr. Ula Lingo       Encounter Date: 04/25/2022  Check In:  Session Check In - 04/25/22 0932       Check-In   Supervising physician immediately available to respond to emergencies [facility] immediately available    Physician(s) Dr.O'Neal    Location [facility]    Staff Present Geanie Cooley, RN;Daphyne Hassell Done, RN, Bjorn Loser, MS, ACSM-CEP, Exercise Physiologist;Heather Zigmund Daniel, Exercise Physiologist    Virtual Visit No    Medication changes reported     No    Fall or balance concerns reported    No    Tobacco Cessation No Change    Warm-up and Cool-down Performed as group-led instruction    Resistance Training Performed Yes    VAD Patient? No    PAD/SET Patient? No      Pain Assessment   Currently in Pain? No/denies    Pain Score 0-No pain    Multiple Pain Sites No             Capillary Blood Glucose: No results found for this or any previous visit (from the past 24 hour(s)).    Social History   Tobacco Use  Smoking Status Former   Packs/day: 1.00   Years: 5.00   Total pack years: 5.00   Types: Cigarettes   Quit date: 09/22/1989   Years since quitting: 32.6  Smokeless Tobacco Former   Types: Snuff   Quit date: 09/22/1989  Tobacco Comments   dipped snuff for one year    Goals Met:  Independence with exercise equipment Exercise tolerated well No report of concerns or symptoms today Strength training completed today  Goals Unmet:  Not Applicable  Comments: check out @ 10:30am   Dr. Carlyle Dolly is Medical Director for [place]

## 2022-04-28 ENCOUNTER — Encounter (HOSPITAL_COMMUNITY)
Admission: RE | Admit: 2022-04-28 | Discharge: 2022-04-28 | Disposition: A | Discharge status: Home / Self Care | Payer: Medicaid Other | Source: Ambulatory Visit | Attending: Cardiology | Admitting: Cardiology

## 2022-04-28 DIAGNOSIS — I5022 Chronic systolic (congestive) heart failure: Secondary | ICD-10-CM | POA: Diagnosis not present

## 2022-04-28 NOTE — Progress Notes (Signed)
Daily Session Note  Patient Details  Name: Michael Vaughn MRN: [id number] Date of Birth: 05-20-1961 Referring Provider:   [name] from 02/13/2022 in [place]  Referring Provider Dr. Ula Lingo       Encounter Date: 04/28/2022  Check In:  Session Check In - 04/28/22 0930       Check-In   Supervising physician immediately available to respond to emergencies [facility] immediately available    Physician(s) Dr. Debara Pickett    Location [facility]    Staff Present Geanie Cooley, RN;Heather Otho Ket, BS, Exercise Physiologist;Debra Wynetta Emery, RN, Bjorn Loser, MS, ACSM-CEP, Exercise Physiologist;Daphyne Hassell Done, RN, BSN    Virtual Visit No    Medication changes reported     No    Fall or balance concerns reported    No    Tobacco Cessation No Change    Warm-up and Cool-down Performed as group-led instruction    Resistance Training Performed Yes    VAD Patient? No    PAD/SET Patient? No      Pain Assessment   Currently in Pain? No/denies    Pain Score 0-No pain    Multiple Pain Sites No             Capillary Blood Glucose: No results found for this or any previous visit (from the past 24 hour(s)).    Social History   Tobacco Use  Smoking Status Former   Packs/day: 1.00   Years: 5.00   Total pack years: 5.00   Types: Cigarettes   Quit date: 09/22/1989   Years since quitting: 32.6  Smokeless Tobacco Former   Types: Snuff   Quit date: 09/22/1989  Tobacco Comments   dipped snuff for one year    Goals Met:  Independence with exercise equipment Exercise tolerated well No report of concerns or symptoms today Strength training completed today  Goals Unmet:  Not Applicable  Comments: check out @ 10:30   Dr. Carlyle Dolly is Medical Director for [place]

## 2022-04-30 ENCOUNTER — Encounter (HOSPITAL_COMMUNITY)
Admission: RE | Admit: 2022-04-30 | Discharge: 2022-04-30 | Disposition: A | Discharge status: Home / Self Care | Payer: Medicaid Other | Source: Ambulatory Visit | Attending: Cardiology | Admitting: Cardiology

## 2022-04-30 DIAGNOSIS — I5022 Chronic systolic (congestive) heart failure: Secondary | ICD-10-CM | POA: Diagnosis not present

## 2022-04-30 NOTE — Progress Notes (Signed)
Daily Session Note  Patient Details  Name: Michael Vaughn MRN: [id number] Date of Birth: 10-Apr-1961 Referring Provider:   [name] from 02/13/2022 in [place]  Referring Provider Dr. Ula Lingo       Encounter Date: 04/30/2022  Check In:  Session Check In - 04/30/22 0930       Check-In   Supervising physician immediately available to respond to emergencies [facility] immediately available    Physician(s) Dr. Phineas Inches    Location [facility]    Staff Present Geanie Cooley, RN;Heather Otho Ket, BS, Exercise Physiologist;Debra Wynetta Emery, RN, Bjorn Loser, MS, ACSM-CEP, Exercise Physiologist;Daphyne Hassell Done, RN, BSN    Virtual Visit No    Medication changes reported     No    Fall or balance concerns reported    No    Tobacco Cessation No Change    Warm-up and Cool-down Performed as group-led instruction    Resistance Training Performed Yes    VAD Patient? No    PAD/SET Patient? No      Pain Assessment   Currently in Pain? No/denies    Pain Score 0-No pain    Multiple Pain Sites No             Capillary Blood Glucose: No results found for this or any previous visit (from the past 24 hour(s)).    Social History   Tobacco Use  Smoking Status Former   Packs/day: 1.00   Years: 5.00   Total pack years: 5.00   Types: Cigarettes   Quit date: 09/22/1989   Years since quitting: 32.6  Smokeless Tobacco Former   Types: Snuff   Quit date: 09/22/1989  Tobacco Comments   dipped snuff for one year    Goals Met:  Independence with exercise equipment Exercise tolerated well No report of concerns or symptoms today Strength training completed today  Goals Unmet:  Not Applicable  Comments: check out @ 10:30am   Dr. Carlyle Dolly is Medical Director for [place]

## 2022-05-02 ENCOUNTER — Encounter (HOSPITAL_COMMUNITY): Payer: Medicaid Other

## 2022-05-05 ENCOUNTER — Encounter (HOSPITAL_COMMUNITY)
Admission: RE | Admit: 2022-05-05 | Discharge: 2022-05-05 | Disposition: A | Discharge status: Home / Self Care | Payer: Medicaid Other | Source: Ambulatory Visit | Attending: Cardiology | Admitting: Cardiology

## 2022-05-05 VITALS — Wt 272.5 lb

## 2022-05-05 DIAGNOSIS — I5022 Chronic systolic (congestive) heart failure: Secondary | ICD-10-CM

## 2022-05-05 NOTE — Progress Notes (Signed)
Daily Session Note  Patient Details  Name: Michael Vaughn MRN: [id number] Date of Birth: 1961/02/24 Referring Provider:   [name] from 02/13/2022 in [place]  Referring Provider Dr. Ula Lingo       Encounter Date: 05/05/2022  Check In:  Session Check In - 05/05/22 0930       Check-In   Supervising physician immediately available to respond to emergencies [facility] immediately available    Physician(s) Dr. Domenic Polite    Location [facility]    Staff Present Redge Gainer, BS, Exercise Physiologist;Dalton Kris Mouton, MS, ACSM-CEP, Exercise Physiologist    Virtual Visit No    Medication changes reported     No    Fall or balance concerns reported    No    Tobacco Cessation No Change    Warm-up and Cool-down Performed as group-led instruction    Resistance Training Performed Yes    VAD Patient? No    PAD/SET Patient? No      Pain Assessment   Currently in Pain? No/denies    Pain Score 0-No pain    Multiple Pain Sites No             Capillary Blood Glucose: No results found for this or any previous visit (from the past 24 hour(s)).    Social History   Tobacco Use  Smoking Status Former   Packs/day: 1.00   Years: 5.00   Total pack years: 5.00   Types: Cigarettes   Quit date: 09/22/1989   Years since quitting: 32.6  Smokeless Tobacco Former   Types: Snuff   Quit date: 09/22/1989  Tobacco Comments   dipped snuff for one year    Goals Met:  Independence with exercise equipment Exercise tolerated well No report of concerns or symptoms today Strength training completed today  Goals Unmet:  Not Applicable  Comments: check out 1030   Dr. Carlyle Dolly is Medical Director for [place]

## 2022-05-07 ENCOUNTER — Encounter (HOSPITAL_COMMUNITY)
Admission: RE | Admit: 2022-05-07 | Discharge: 2022-05-07 | Disposition: A | Discharge status: Home / Self Care | Payer: Medicaid Other | Source: Ambulatory Visit | Attending: Cardiology | Admitting: Cardiology

## 2022-05-07 DIAGNOSIS — I5022 Chronic systolic (congestive) heart failure: Secondary | ICD-10-CM

## 2022-05-07 NOTE — Progress Notes (Signed)
Daily Session Note  Patient Details  Name: Michael Vaughn MRN: [id number] Date of Birth: 07-Aug-1961 Referring Provider:   [name] from 02/13/2022 in [place]  Referring Provider Dr. Ula Lingo       Encounter Date: 05/07/2022  Check In:  Session Check In - 05/07/22 0930       Check-In   Supervising physician immediately available to respond to emergencies [facility] immediately available    Physician(s) Dr. Johnsie Cancel    Location [facility]    Staff Present Redge Gainer, BS, Exercise Physiologist;Dalton Kris Mouton, MS, ACSM-CEP, Exercise Physiologist    Virtual Visit No    Medication changes reported     No    Fall or balance concerns reported    No    Tobacco Cessation No Change    Warm-up and Cool-down Performed as group-led instruction    Resistance Training Performed Yes    VAD Patient? No    PAD/SET Patient? No      Pain Assessment   Currently in Pain? No/denies    Pain Score 0-No pain    Multiple Pain Sites No             Capillary Blood Glucose: No results found for this or any previous visit (from the past 24 hour(s)).    Social History   Tobacco Use  Smoking Status Former   Packs/day: 1.00   Years: 5.00   Total pack years: 5.00   Types: Cigarettes   Quit date: 09/22/1989   Years since quitting: 32.6  Smokeless Tobacco Former   Types: Snuff   Quit date: 09/22/1989  Tobacco Comments   dipped snuff for one year    Goals Met:  Independence with exercise equipment Exercise tolerated well No report of concerns or symptoms today Strength training completed today  Goals Unmet:  Not Applicable  Comments: check out 1030   Dr. Carlyle Dolly is Medical Director for [place]

## 2022-05-07 NOTE — Progress Notes (Signed)
Cardiac Individual Treatment Plan  Patient Details  Name: Michael Vaughn MRN: [id number] Date of Birth: 22-Aug-1961 Referring Provider:   [name] from 02/13/2022 in [facility] CARDIAC REHABILITATION  Referring Provider Dr. Clydene Pugh       Initial Encounter Date:  [name] from 02/13/2022 in [place] CARDIAC REHABILITATION  Date 02/13/22       Visit Diagnosis: Chronic systolic heart failure (HCC)  Patient's Home Medications on Admission:  Current Outpatient Medications:    allopurinol (ZYLOPRIM) 300 MG tablet, Take 300 mg by mouth daily., Disp: , Rfl:    aspirin 325 MG tablet, Take 325 mg by mouth daily., Disp: , Rfl:    finasteride (PROSCAR) 5 MG tablet, Take 5 mg by mouth daily., Disp: , Rfl:    fluticasone (FLONASE) 50 MCG/ACT nasal spray, Place 1 spray into both nostrils daily as needed for allergies., Disp: , Rfl:    glipiZIDE (GLUCOTROL XL) 10 MG 24 hr tablet, Take 10 mg by mouth daily.  , Disp: , Rfl:    HYDROcodone-acetaminophen (NORCO/VICODIN) 5-325 MG tablet, Take 1 tablet by mouth every 12 (twelve) hours as needed for moderate pain., Disp: , Rfl:    insulin glargine (LANTUS) 100 UNIT/ML injection, Inject 24 Units into the skin daily., Disp: , Rfl:    magnesium oxide (MAG-OX) 400 MG tablet, Take 400 mg by mouth daily., Disp: , Rfl:    meclizine (ANTIVERT) 25 MG tablet, Take 1 tablet by mouth 3 (three) times daily as needed for dizziness., Disp: , Rfl:    metFORMIN (GLUCOPHAGE) 1000 MG tablet, Take 1,000 mg by mouth 2 (two) times daily with a meal., Disp: , Rfl:    metoprolol succinate (TOPROL-XL) 50 MG 24 hr tablet, Take 50 mg by mouth daily., Disp: , Rfl:    Multiple Vitamin (MULTI-VITAMINS) TABS, Take 1 tablet daily by mouth., Disp: , Rfl:    nitroGLYCERIN (NITROSTAT) 0.4 MG SL tablet, Place 0.4 mg under the tongue., Disp: , Rfl:    omega-3 acid ethyl esters (LOVAZA) 1 g capsule, Take 1 g daily  by mouth., Disp: , Rfl:    Polyethyl Glycol-Propyl Glycol (SYSTANE) 0.4-0.3 % SOLN, Place 1 application. into both eyes daily as needed (Dry eyes)., Disp: , Rfl:    rosuvastatin (CRESTOR) 40 MG tablet, Take 40 mg by mouth daily., Disp: , Rfl:    sacubitril-valsartan (ENTRESTO) 24-26 MG, Take 1 tablet by mouth 2 (two) times daily., Disp: , Rfl:    senna-docusate (SENOKOT-S) 8.6-50 MG tablet, Take 2 tablets by mouth 2 (two) times daily as needed for mild constipation or moderate constipation., Disp: , Rfl:    spironolactone (ALDACTONE) 25 MG tablet, Take 12.5 mg daily by mouth., Disp: , Rfl:    tadalafil (CIALIS) 5 MG tablet, Take 5 mg by mouth daily., Disp: , Rfl:    tamsulosin (FLOMAX) 0.4 MG CAPS capsule, Take 0.4 mg by mouth daily., Disp: , Rfl:    torsemide (DEMADEX) 20 MG tablet, Take 60 mg by mouth daily., Disp: , Rfl:   Past Medical History: Past Medical History:  Diagnosis Date   Anginal pain (HCC)    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   Diabetes mellitus    Dyslipidemia    Dysrhythmia    Ejection fraction    Improved from the past  /  ejection fraction 50%, echo, July, 2012, hypokinesis at the base of the inferior wall.   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         Capillary Blood Glucose: Lab Results  Component Value Date   GLUCAP 174 (H) 06/28/2018   GLUCAP 136 (H) 06/18/2018   GLUCAP 194 (H) 08/13/2017   GLUCAP 203 (H) 08/13/2017   GLUCAP 184 (H) 08/05/2017    POCT Glucose     Row Name 02/13/22 0818             POCT Blood Glucose   Pre-Exercise 144 mg/dL                Exercise Target Goals: Exercise Program Goal: Individual exercise prescription set using results from initial 6 min walk test and THRR while considering  patient's activity barriers and safety.   Exercise Prescription Goal: Starting with aerobic activity 30 plus minutes a day, 3 days per week for initial exercise prescription. Provide home exercise prescription and guidelines that participant acknowledges understanding prior to discharge.  Activity Barriers & Risk Stratification:  Activity Barriers & Cardiac Risk Stratification - 02/13/22 0857       Activity Barriers & Cardiac Risk Stratification   Activity Barriers Back Problems;Joint Problems;Deconditioning;Decreased Ventricular Function    Cardiac Risk Stratification High             6 Minute Walk:  6 Minute Walk     Row Name 02/13/22 0922         6 Minute Walk   Phase Initial     Distance 1400 feet     Walk Time 6 minutes     # of Rest Breaks 0     MPH 2.65     METS 2.84     RPE 11     VO2 Peak 9.94     Symptoms No      Resting HR 76 bpm     Resting BP 110/62     Resting Oxygen Saturation  98 %     Exercise Oxygen Saturation  during  6 min walk 98 %     Max Ex. HR 95 bpm     Max Ex. BP 120/60     2 Minute Post BP 116/60              Oxygen Initial Assessment:   Oxygen Re-Evaluation:   Oxygen Discharge (Final Oxygen Re-Evaluation):   Initial Exercise Prescription:  Initial Exercise Prescription - 02/13/22 0900       Date of Initial Exercise RX and Referring Provider   Date 02/13/22    Referring Provider Dr. Ula Lingo    Expected Discharge Date 05/12/22      Treadmill   MPH 1.6    Grade 0    Minutes 17      NuStep   Level 1    SPM 70    Minutes 22      Prescription Details   Frequency (times per week) 3    Duration Progress to 30 minutes of continuous aerobic without signs/symptoms of physical distress      Intensity   THRR 40-80% of Max Heartrate 64-128    Ratings of Perceived Exertion 11-13      Resistance Training   Training Prescription Yes    Weight 4    Reps 10-15             Perform Capillary Blood Glucose checks as needed.  Exercise Prescription Changes:   Exercise Prescription Changes     Row Name 02/24/22 1500 03/10/22 1100 03/19/22 1000 03/21/22 1045 04/07/22 1000     Response to Exercise   Blood Pressure (Admit) 118/70 112/70 -- 120/60 102/62   Blood Pressure (Exercise) 128/62 126/62 -- 132/70 120/58   Blood Pressure (Exit) 112/60 100/72 -- 102/64 100/64   Heart Rate (Admit) 93 bpm 82 bpm -- 80 bpm 81 bpm   Heart Rate (Exercise) 107 bpm 124 bpm -- 121 bpm 118 bpm   Heart Rate (Exit) 102 bpm 104 bpm -- 88 bpm 90 bpm   Rating of Perceived Exertion (Exercise) 12 12 -- 12 12   Duration Continue with 30 min of aerobic exercise without signs/symptoms of physical distress. Continue with 30 min of aerobic exercise without signs/symptoms of physical distress. -- Continue with 30 min of aerobic exercise without signs/symptoms of physical distress. Continue  with 30 min of aerobic exercise without signs/symptoms of physical distress.   Intensity THRR unchanged THRR unchanged -- THRR unchanged THRR unchanged     Progression   Progression Continue to progress workloads to maintain intensity without signs/symptoms of physical distress. Continue to progress workloads to maintain intensity without signs/symptoms of physical distress. -- Continue to progress workloads to maintain intensity without signs/symptoms of physical distress. Continue to progress workloads to maintain intensity without signs/symptoms of physical distress.     Resistance Training   Training Prescription Yes Yes -- Yes Yes   Weight 4 5 -- 5 5   Reps 10-15 10-15 -- 10-15 10-15   Time 10 Minutes 10 Minutes -- 10 Minutes 10 Minutes     Treadmill   MPH 1.8 2.2 -- 2 2   Grade 0 0 -- 0 0   Minutes 17 22 -- 22 22   METs 2.38 2.69 -- 2.53 2.53     NuStep   Level 1 2 -- 3 3   SPM 117 126 -- 120 119   Minutes 22 17 -- 17 17   METs 1.88 2.54 -- 2.62 2.56     Home Exercise Plan   Plans  to continue exercise at -- -- Home (comment) -- --   Frequency -- -- Add 2 additional days to program exercise sessions. -- --   Initial Home Exercises Provided -- -- 03/19/22 -- --    [place] Name 04/21/22 1000 05/05/22 1200           Response to Exercise   Blood Pressure (Admit) 100/60 108/58      Blood Pressure (Exercise) 120/66 128/60      Blood Pressure (Exit) 100/60 110/60      Heart Rate (Admit) 83 bpm 72 bpm      Heart Rate (Exercise) 114 bpm 120 bpm      Heart Rate (Exit) 92 bpm 84 bpm      Rating of Perceived Exertion (Exercise) 12 13      Duration Continue with 30 min of aerobic exercise without signs/symptoms of physical distress. Continue with 30 min of aerobic exercise without signs/symptoms of physical distress.      Intensity THRR unchanged THRR unchanged        Progression   Progression Continue to progress workloads to maintain intensity without signs/symptoms of physical  distress. Continue to progress workloads to maintain intensity without signs/symptoms of physical distress.        Resistance Training   Training Prescription Yes Yes      Weight 5 10      Reps 10-15 10-15      Time 10 Minutes 10 Minutes        Treadmill   MPH 2.3 2.4      Grade 0 0      Minutes 22 22      METs 2.76 2.84        NuStep   Level 3 5      SPM 125 112      Minutes 17 17      METs 2.83 2.89               Exercise Comments:   Exercise Comments     Row Name 03/19/22 1001           Exercise Comments home exercised reviewed                Exercise Goals and Review:   Exercise Goals     Row Name 02/13/22 [id number] 03/10/22 1102 04/07/22 1045 05/05/22 1252       Exercise Goals   Increase Physical Activity Yes Yes Yes Yes    Intervention Develop an individualized exercise prescription for aerobic and resistive training based on initial evaluation findings, risk stratification, comorbidities and participant's personal goals.;Provide advice, education, support and counseling about physical activity/exercise needs. Develop an individualized exercise prescription for aerobic and resistive training based on initial evaluation findings, risk stratification, comorbidities and participant's personal goals.;Provide advice, education, support and counseling about physical activity/exercise needs. Develop an individualized exercise prescription for aerobic and resistive training based on initial evaluation findings, risk stratification, comorbidities and participant's personal goals.;Provide advice, education, support and counseling about physical activity/exercise needs. Develop an individualized exercise prescription for aerobic and resistive training based on initial evaluation findings, risk stratification, comorbidities and participant's personal goals.;Provide advice, education, support and counseling about physical activity/exercise needs.    Expected Outcomes Short  Term: Attend rehab on a regular basis to increase amount of physical activity.;Long Term: Exercising regularly at least 3-5 days a week.;Long Term: Add in home exercise to make exercise part of routine and to increase amount of physical activity. Short Term: Attend rehab  on a regular basis to increase amount of physical activity.;Long Term: Exercising regularly at least 3-5 days a week.;Long Term: Add in home exercise to make exercise part of routine and to increase amount of physical activity. Short Term: Attend rehab on a regular basis to increase amount of physical activity.;Long Term: Exercising regularly at least 3-5 days a week.;Long Term: Add in home exercise to make exercise part of routine and to increase amount of physical activity. Short Term: Attend rehab on a regular basis to increase amount of physical activity.;Long Term: Exercising regularly at least 3-5 days a week.;Long Term: Add in home exercise to make exercise part of routine and to increase amount of physical activity.    Increase Strength and Stamina Yes Yes Yes Yes    Intervention Provide advice, education, support and counseling about physical activity/exercise needs.;Develop an individualized exercise prescription for aerobic and resistive training based on initial evaluation findings, risk stratification, comorbidities and participant's personal goals. Provide advice, education, support and counseling about physical activity/exercise needs.;Develop an individualized exercise prescription for aerobic and resistive training based on initial evaluation findings, risk stratification, comorbidities and participant's personal goals. Provide advice, education, support and counseling about physical activity/exercise needs.;Develop an individualized exercise prescription for aerobic and resistive training based on initial evaluation findings, risk stratification, comorbidities and participant's personal goals. Provide advice, education, support  and counseling about physical activity/exercise needs.;Develop an individualized exercise prescription for aerobic and resistive training based on initial evaluation findings, risk stratification, comorbidities and participant's personal goals.    Expected Outcomes Short Term: Perform resistance training exercises routinely during rehab and add in resistance training at home;Short Term: Increase workloads from initial exercise prescription for resistance, speed, and METs.;Long Term: Improve cardiorespiratory fitness, muscular endurance and strength as measured by increased METs and functional capacity (6MWT) Short Term: Perform resistance training exercises routinely during rehab and add in resistance training at home;Short Term: Increase workloads from initial exercise prescription for resistance, speed, and METs.;Long Term: Improve cardiorespiratory fitness, muscular endurance and strength as measured by increased METs and functional capacity (6MWT) Short Term: Perform resistance training exercises routinely during rehab and add in resistance training at home;Short Term: Increase workloads from initial exercise prescription for resistance, speed, and METs.;Long Term: Improve cardiorespiratory fitness, muscular endurance and strength as measured by increased METs and functional capacity (6MWT) Short Term: Perform resistance training exercises routinely during rehab and add in resistance training at home;Short Term: Increase workloads from initial exercise prescription for resistance, speed, and METs.;Long Term: Improve cardiorespiratory fitness, muscular endurance and strength as measured by increased METs and functional capacity (6MWT)    Able to understand and use rate of perceived exertion (RPE) scale Yes Yes Yes Yes    Intervention Provide education and explanation on how to use RPE scale Provide education and explanation on how to use RPE scale Provide education and explanation on how to use RPE scale  Provide education and explanation on how to use RPE scale    Expected Outcomes Short Term: Able to use RPE daily in rehab to express subjective intensity level;Long Term:  Able to use RPE to guide intensity level when exercising independently Short Term: Able to use RPE daily in rehab to express subjective intensity level;Long Term:  Able to use RPE to guide intensity level when exercising independently Short Term: Able to use RPE daily in rehab to express subjective intensity level;Long Term:  Able to use RPE to guide intensity level when exercising independently Short Term: Able to use RPE daily  in rehab to express subjective intensity level;Long Term:  Able to use RPE to guide intensity level when exercising independently    Knowledge and understanding of Target Heart Rate Range (THRR) Yes Yes Yes Yes    Intervention Provide education and explanation of THRR including how the numbers were predicted and where they are located for reference Provide education and explanation of THRR including how the numbers were predicted and where they are located for reference Provide education and explanation of THRR including how the numbers were predicted and where they are located for reference Provide education and explanation of THRR including how the numbers were predicted and where they are located for reference    Expected Outcomes Short Term: Able to state/look up THRR;Long Term: Able to use THRR to govern intensity when exercising independently;Short Term: Able to use daily as guideline for intensity in rehab Short Term: Able to state/look up THRR;Long Term: Able to use THRR to govern intensity when exercising independently;Short Term: Able to use daily as guideline for intensity in rehab Short Term: Able to state/look up THRR;Long Term: Able to use THRR to govern intensity when exercising independently;Short Term: Able to use daily as guideline for intensity in rehab Short Term: Able to state/look up THRR;Long  Term: Able to use THRR to govern intensity when exercising independently;Short Term: Able to use daily as guideline for intensity in rehab    Able to check pulse independently Yes Yes Yes Yes    Intervention Provide education and demonstration on how to check pulse in carotid and radial arteries.;Review the importance of being able to check your own pulse for safety during independent exercise Provide education and demonstration on how to check pulse in carotid and radial arteries.;Review the importance of being able to check your own pulse for safety during independent exercise Provide education and demonstration on how to check pulse in carotid and radial arteries.;Review the importance of being able to check your own pulse for safety during independent exercise Provide education and demonstration on how to check pulse in carotid and radial arteries.;Review the importance of being able to check your own pulse for safety during independent exercise    Expected Outcomes Short Term: Able to explain why pulse checking is important during independent exercise;Long Term: Able to check pulse independently and accurately Short Term: Able to explain why pulse checking is important during independent exercise;Long Term: Able to check pulse independently and accurately Short Term: Able to explain why pulse checking is important during independent exercise;Long Term: Able to check pulse independently and accurately Short Term: Able to explain why pulse checking is important during independent exercise;Long Term: Able to check pulse independently and accurately    Understanding of Exercise Prescription Yes Yes Yes Yes    Intervention Provide education, explanation, and written materials on patient's individual exercise prescription Provide education, explanation, and written materials on patient's individual exercise prescription Provide education, explanation, and written materials on patient's individual exercise  prescription Provide education, explanation, and written materials on patient's individual exercise prescription    Expected Outcomes Short Term: Able to explain program exercise prescription;Long Term: Able to explain home exercise prescription to exercise independently Short Term: Able to explain program exercise prescription;Long Term: Able to explain home exercise prescription to exercise independently Short Term: Able to explain program exercise prescription;Long Term: Able to explain home exercise prescription to exercise independently Short Term: Able to explain program exercise prescription;Long Term: Able to explain home exercise prescription to exercise independently  Exercise Goals Re-Evaluation :  Exercise Goals Re-Evaluation     Row Name 03/10/22 1103 04/07/22 1045 05/05/22 1253         Exercise Goal Re-Evaluation   Exercise Goals Review Increase Physical Activity;Increase Strength and Stamina;Able to understand and use rate of perceived exertion (RPE) scale;Knowledge and understanding of Target Heart Rate Range (THRR);Able to check pulse independently;Understanding of Exercise Prescription Increase Physical Activity;Increase Strength and Stamina;Able to understand and use rate of perceived exertion (RPE) scale;Knowledge and understanding of Target Heart Rate Range (THRR);Able to check pulse independently;Understanding of Exercise Prescription Increase Strength and Stamina;Increase Physical Activity;Able to understand and use rate of perceived exertion (RPE) scale;Knowledge and understanding of Target Heart Rate Range (THRR);Able to check pulse independently;Understanding of Exercise Prescription     Comments Pt has completed 9 sessions of CR. He is motivated to improve himself and he has been able to progress his workloads. He is currently exercising at 2.69 METs on the treadmill. Will continue to monitor and progress as able. Pt has completed 18 sessions of cardiac rehab.  He continues to be motivated during class and progressing his workloads. He is currently exercising at 2.56 METs on the stepper. Will continue to monitor and progress as able. Pt has compeleted 29 sessions of cardiac rehab. He continues to increase his workload during class and is motivated. He is currently exercising at 2.89 METs on the stepper. Will continue to monitor and progress as able.     Expected Outcomes Through exercise at rehab and at home, pt will meet their stated goals. Through exercise at rehab and at home, pt will meet their stated goals. Through exercise at rehab and at home, pt will meet their stated goals.               Discharge Exercise Prescription (Final Exercise Prescription Changes):  Exercise Prescription Changes - 05/05/22 1200       Response to Exercise   Blood Pressure (Admit) 108/58    Blood Pressure (Exercise) 128/60    Blood Pressure (Exit) 110/60    Heart Rate (Admit) 72 bpm    Heart Rate (Exercise) 120 bpm    Heart Rate (Exit) 84 bpm    Rating of Perceived Exertion (Exercise) 13    Duration Continue with 30 min of aerobic exercise without signs/symptoms of physical distress.    Intensity THRR unchanged      Progression   Progression Continue to progress workloads to maintain intensity without signs/symptoms of physical distress.      Resistance Training   Training Prescription Yes    Weight 10    Reps 10-15    Time 10 Minutes      Treadmill   MPH 2.4    Grade 0    Minutes 22    METs 2.84      NuStep   Level 5    SPM 112    Minutes 17    METs 2.89             Nutrition:  Target Goals: Understanding of nutrition guidelines, daily intake of sodium 1500mg , cholesterol 200mg , calories 30% from fat and 7% or less from saturated fats, daily to have 5 or more servings of fruits and vegetables.  Biometrics:  Pre Biometrics - 02/13/22 0927       Pre Biometrics   Height 5\' 11"  (1.803 m)    Weight 125.3 kg    Waist Circumference  52 inches    Hip Circumference 46 inches  Waist to Hip Ratio 1.13 %    BMI (Calculated) 38.54    Triceps Skinfold 15 mm    % Body Fat 36.6 %    Grip Strength 36.9 kg    Flexibility 0 in    Single Leg Stand 23 seconds              Nutrition Therapy Plan and Nutrition Goals:  Nutrition Therapy & Goals - 03/03/22 0913       Personal Nutrition Goals   Comments Patient scored 24 on his diet assessment. We offer 2 educational sessions on heart healthy nutrition with handouts and assistance with RD referral if patient is interested.      Intervention Plan   Intervention Nutrition handout(s) given to patient.    Expected Outcomes Short Term Goal: Understand basic principles of dietary content, such as calories, fat, sodium, cholesterol and nutrients.             Nutrition Assessments:  Nutrition Assessments - 02/13/22 0831       MEDFICTS Scores   Pre Score 24            MEDIFICTS Score Key: ?70 Need to make dietary changes  40-70 Heart Healthy Diet ? 40 Therapeutic Level Cholesterol Diet   Picture Your Plate Scores: [id number] Unhealthy dietary pattern with much room for improvement. 41-50 Dietary pattern unlikely to meet recommendations for good health and room for improvement. 51-60 More healthful dietary pattern, with some room for improvement.  >60 Healthy dietary pattern, although there may be some specific behaviors that could be improved.    Nutrition Goals Re-Evaluation:   Nutrition Goals Discharge (Final Nutrition Goals Re-Evaluation):   Psychosocial: Target Goals: Acknowledge presence or absence of significant depression and/or stress, maximize coping skills, provide positive support system. Participant is able to verbalize types and ability to use techniques and skills needed for reducing stress and depression.  Initial Review & Psychosocial Screening:  Initial Psych Review & Screening - 02/13/22 0851       Initial Review   Current issues with  None Identified      Family Dynamics   Good Support System? Yes    Comments His support system is his sister.      Barriers   Psychosocial barriers to participate in program There are no identifiable barriers or psychosocial needs.      Screening Interventions   Interventions Encouraged to exercise    Expected Outcomes Long Term goal: The participant improves quality of Life and PHQ9 Scores as seen by post scores and/or verbalization of changes;Short Term goal: Identification and review with participant of any Quality of Life or Depression concerns found by scoring the questionnaire.             Quality of Life Scores:  Quality of Life - 02/13/22 0927       Quality of Life   Select Quality of Life      Quality of Life Scores   Health/Function Pre 18.4 %    Socioeconomic Pre 20.57 %    Psych/Spiritual Pre 24.86 %    Family Pre 22.8 %    GLOBAL Pre 20.82 %            Scores of 19 and below usually indicate a poorer quality of life in these areas.  A difference of  2-3 points is a clinically meaningful difference.  A difference of 2-3 points in the total score of the Quality of Life Index has been associated with significant  improvement in overall quality of life, self-image, physical symptoms, and general health in studies assessing change in quality of life.  PHQ-9: Review Flowsheet       02/13/2022 08/06/2018  Depression screen PHQ 2/9  Decreased Interest 0 0  Down, Depressed, Hopeless 0 0  PHQ - 2 Score 0 0  Altered sleeping 1 1  Tired, decreased energy 3 1  Change in appetite 0 0  Feeling bad or failure about yourself  0 0  Trouble concentrating 0 0  Moving slowly or fidgety/restless 0 0  Suicidal thoughts 0 -  PHQ-9 Score 4 2  Difficult doing work/chores Somewhat difficult -   Interpretation of Total Score  Total Score Depression Severity:  1-4 = Minimal depression, 5-9 = Mild depression, 10-14 = Moderate depression, 15-19 = Moderately severe depression,  20-27 = Severe depression   Psychosocial Evaluation and Intervention:  Psychosocial Evaluation - 02/13/22 0918       Psychosocial Evaluation & Interventions   Interventions Encouraged to exercise with the program and follow exercise prescription    Comments Pt has no barriers to paricipate in CR. He has no identifiable psychosocial issues. He scored a 4 on his PHQ-9 and he relates this to his heart condition. He feels like he never has any energy. This has worsened over the past few months. He was previously exercising multiple days per week at the [facility], but then he began having problems with fluid retention. This has since been taken care of, but [facility] He reports that his sister is his main support system. He is seperated from his wife, but this was not something that he brought up or spoke about at all. His goals while in the program are to lose weight and to regain his strength and stamina. He has a positive outlook and is eager to start the program.    Expected Outcomes Pt will continue to have no identifiable psychosocial issues.    Continue Psychosocial Services  No Follow up required             Psychosocial Re-Evaluation:  Psychosocial Re-Evaluation     [place] Name 03/03/22 0915 03/31/22 0755 04/28/22 0820         Psychosocial Re-Evaluation   Current issues with None Identified None Identified None Identified     Comments Patient is new to the program completing 5 sessions. He continues to have no psychosocial barriers identified. He enjoys coming to the sessions and demonstrates an interest in improving his health. We will continue to monitor. Patient has completed 14 sessions. He continues to have no psychosocial barriers identified. He continues to enjoy coming to the sessions and demonstrates an interest in improving his health. We will continue to monitor. Patient has completed 26 sessions. He continues to have no psychosocial barriers identified.  He continues to enjoy coming to the sessions and demonstrates an interest in improving his health. We will continue to monitor.     Expected Outcomes Patient will not have any psychosocial issues identified at [place].  Patient will not have any psychosocial issues identified at [place].  Patient will not have any psychosocial issues identified at [place].      Interventions Stress management education;Relaxation education;Encouraged to attend Cardiac Rehabilitation for the exercise Stress management education;Relaxation education;Encouraged to attend Cardiac Rehabilitation for the exercise Stress management education;Relaxation education;Encouraged to attend Cardiac Rehabilitation for the exercise     Continue Psychosocial Services  No Follow up required No Follow  up required No Follow up required              Psychosocial Discharge (Final Psychosocial Re-Evaluation):  Psychosocial Re-Evaluation - 04/28/22 0820       Psychosocial Re-Evaluation   Current issues with None Identified    Comments Patient has completed 26 sessions. He continues to have no psychosocial barriers identified. He continues to enjoy coming to the sessions and demonstrates an interest in improving his health. We will continue to monitor.    Expected Outcomes Patient will not have any psychosocial issues identified at [place].     Interventions Stress management education;Relaxation education;Encouraged to attend Cardiac Rehabilitation for the exercise    Continue Psychosocial Services  No Follow up required             Vocational Rehabilitation: Provide vocational rehab assistance to qualifying candidates.   Vocational Rehab Evaluation & Intervention:  Vocational Rehab - 02/13/22 0829       Initial Vocational Rehab Evaluation & Intervention   Assessment shows need for Vocational Rehabilitation No      Vocational Rehab Re-Evaulation   Comments He is disabled and not going back to work.              Education: Education Goals: Education classes will be provided on a weekly basis, covering required topics. Participant will state understanding/return demonstration of topics presented.  Learning Barriers/Preferences:  Learning Barriers/Preferences - 02/13/22 [id number]       Learning Barriers/Preferences   Learning Barriers None    Learning Preferences Group Instruction;Individual Instruction;Skilled Demonstration;Written Material             Education Topics: Hypertension, Hypertension Reduction -Define heart disease and high blood pressure. Discus how high blood pressure affects the body and ways to reduce high blood pressure. Flowsheet Row CARDIAC REHAB PHASE II EXERCISE from 08/04/2018 in [place]  Date 07/28/18  Educator D. Coad  Instruction Review Code 2- Demonstrated Understanding       Exercise and Your Heart -Discuss why it is important to exercise, the FITT principles of exercise, normal and abnormal responses to exercise, and how to exercise safely. Flowsheet Row CARDIAC REHAB PHASE II EXERCISE from 04/30/2022 in [place]  Date 04/02/22  Educator [place]  Instruction Review Code 1- Verbalizes Understanding       Angina -Discuss definition of angina, causes of angina, treatment of angina, and how to decrease risk of having angina. Flowsheet Row CARDIAC REHAB PHASE II EXERCISE from 04/30/2022 in [place]  Date 04/09/22  Educator [place]  Instruction Review Code 1- Verbalizes Understanding       Cardiac Medications -Review what the following cardiac medications are used for, how they affect the body, and side effects that may occur when taking the medications.  Medications include Aspirin, Beta blockers, calcium channel blockers, ACE Inhibitors, angiotensin receptor blockers, diuretics, digoxin, and antihyperlipidemics. Flowsheet Row CARDIAC REHAB PHASE II EXERCISE from 04/30/2022 in [place]  Date 04/16/22  Educator DF  Instruction Review Code 1- Verbalizes Understanding       Congestive Heart Failure -Discuss the definition of CHF, how to live with CHF, the signs and symptoms of CHF, and how keep track of weight and sodium intake. Flowsheet Row CARDIAC REHAB PHASE II EXERCISE from 04/30/2022 in [place]  Date 04/23/22  Educator pb  Instruction Review Code 1- Verbalizes Understanding       Heart Disease and Intimacy -Discus the effect sexual  activity has on the heart, how changes occur during intimacy as we age, and safety during sexual activity. Flowsheet Row CARDIAC REHAB PHASE II EXERCISE from 04/30/2022 in [place]  Date 04/30/22  Educator pb  Instruction Review Code 1- Verbalizes Understanding       Smoking Cessation / COPD -Discuss different methods to quit smoking, the health benefits of quitting smoking, and the definition of COPD. Flowsheet Row CARDIAC REHAB PHASE II EXERCISE from 08/04/2018 in [place]  Date 06/09/18  Educator Etheleen Mayhew  Instruction Review Code 2- Demonstrated Understanding       Nutrition I: Fats -Discuss the types of cholesterol, what cholesterol does to the heart, and how cholesterol levels can be controlled. Flowsheet Row CARDIAC REHAB PHASE II EXERCISE from 08/04/2018 in [place]  Date 06/16/18  Educator D. Coad  Instruction Review Code 2- Demonstrated Understanding       Nutrition II: Labels -Discuss the different components of food labels and how to read food label [place] from 08/04/2018 in [place]  Date 06/23/18  Educator Etheleen Mayhew  Instruction Review Code 2- Demonstrated Understanding       Heart Parts/Heart Disease and PAD -Discuss the anatomy of the heart, the pathway of blood circulation through the heart, and these are  affected by heart disease. Flowsheet Row CARDIAC REHAB PHASE II EXERCISE from 04/30/2022 in [place]  Date 03/05/22  Educator [place]  Instruction Review Code 2- Demonstrated Understanding       Stress I: Signs and Symptoms -Discuss the causes of stress, how stress may lead to anxiety and depression, and ways to limit stress. Flowsheet Row CARDIAC REHAB PHASE II EXERCISE from 04/30/2022 in [place]  Date 03/12/22  Educator hj  Instruction Review Code 2- Demonstrated Understanding       Stress II: Relaxation -Discuss different types of relaxation techniques to limit stress. Flowsheet Row CARDIAC REHAB PHASE II EXERCISE from 04/30/2022 in [place]  Date 03/19/22  Educator [place]  Instruction Review Code 1- Verbalizes Understanding       Warning Signs of Stroke / TIA -Discuss definition of a stroke, what the signs and symptoms are of a stroke, and how to identify when someone is having stroke.   Knowledge Questionnaire Score:  Knowledge Questionnaire Score - 02/13/22 0853       Knowledge Questionnaire Score   Pre Score 20/24             Core Components/Risk Factors/Patient Goals at Admission:  Personal Goals and Risk Factors at Admission - 02/13/22 0855       Core Components/Risk Factors/Patient Goals on Admission    Weight Management Yes;Obesity;Weight Loss    Intervention Weight Management: Develop a combined nutrition and exercise program designed to reach desired caloric intake, while maintaining appropriate intake of nutrient and fiber, sodium and fats, and appropriate energy expenditure required for the weight goal.;Weight Management: Provide education and appropriate resources to help participant work on and attain dietary goals.;Weight Management/Obesity: Establish reasonable short term and long term weight goals.;Obesity: Provide education and appropriate resources to help participant work on and  attain dietary goals.    Expected Outcomes Short Term: Continue to assess and modify interventions until short term weight is achieved;Long Term: Adherence to nutrition and physical activity/exercise program aimed toward attainment of established weight goal;Weight Maintenance: Understanding of the daily nutrition guidelines, which includes 25-35% calories from  fat, 7% or less cal from saturated fats, less than 200mg  cholesterol, less than 1.5gm of sodium, & 5 or more servings of fruits and vegetables daily;Weight Loss: Understanding of general recommendations for a balanced deficit meal plan, which promotes 1-2 lb weight loss per week and includes a negative energy balance of [phone] kcal/d;Understanding recommendations for meals to include 15-35% energy as protein, 25-35% energy from fat, 35-60% energy from carbohydrates, less than 200mg  of dietary cholesterol, 20-35 gm of total fiber daily;Understanding of distribution of calorie intake throughout the day with the consumption of 4-5 meals/snacks    Diabetes Yes    Intervention Provide education about signs/symptoms and action to take for hypo/hyperglycemia.;Provide education about proper nutrition, including hydration, and aerobic/resistive exercise prescription along with prescribed medications to achieve blood glucose in normal ranges: Fasting glucose 65-99 mg/dL    Expected Outcomes Short Term: Participant verbalizes understanding of the signs/symptoms and immediate care of hyper/hypoglycemia, proper foot care and importance of medication, aerobic/resistive exercise and nutrition plan for blood glucose control.;Long Term: Attainment of HbA1C < 7%.    Heart Failure Yes    Intervention Provide a combined exercise and nutrition program that is supplemented with education, support and counseling about heart failure. Directed toward relieving symptoms such as shortness of breath, decreased exercise tolerance, and extremity edema.    Expected Outcomes  Improve functional capacity of life;Short term: Attendance in program 2-3 days a week with increased exercise capacity. Reported lower sodium intake. Reported increased fruit and vegetable intake. Reports medication compliance.;Short term: Daily weights obtained and reported for increase. Utilizing diuretic protocols set by physician.;Long term: Adoption of self-care skills and reduction of barriers for early signs and symptoms recognition and intervention leading to self-care maintenance.    Hypertension Yes    Intervention Provide education on lifestyle modifcations including regular physical activity/exercise, weight management, moderate sodium restriction and increased consumption of fresh fruit, vegetables, and low fat dairy, alcohol moderation, and smoking cessation.;Monitor prescription use compliance.    Expected Outcomes Short Term: Continued assessment and intervention until BP is < 140/106mm HG in hypertensive participants. < 130/47mm HG in hypertensive participants with diabetes, heart failure or chronic kidney disease.;Long Term: Maintenance of blood pressure at goal levels.    Personal Goal Other Yes    Personal Goal Increase strength and stamina    Intervention Attend CR 3 days per week and exercise at home 2-4 days per week    Expected Outcomes Pt will report improvement in his strength and stamina and his six minute walk test distance will increase.             Core Components/Risk Factors/Patient Goals Review:   Goals and Risk Factor Review     Row Name 03/03/22 0916 03/31/22 0756 03/31/22 1229 04/28/22 0821       Core Components/Risk Factors/Patient Goals Review   Personal Goals Review Weight Management/Obesity;Heart Failure;Hypertension Weight Management/Obesity;Heart Failure;Hypertension -- Weight Management/Obesity;Heart Failure;Hypertension    Review Patient was referred to CR with chronic systolic HF. He has multiple risk factors for CAD and is participating in the  program for risk modification.  He has complete 5 sessions. His current weight is 278 up 11 lbs from his initial visit. He saw his cardiologist at [place] 6/6 for HF management. No changes were made. His last A1C from those notes was 8.8%. He is on Metformin for DM management. His home glucose readings average 170 g/dl. His blood pressue is at goal. He reported to his cardiologist he was having less  SOB and is feeling stronger. His personal goals for the program are to lose weight and improve his strength and stamina. We will continue to monitor his progress as he works towards meeting these goals. Patient has completed 14 sessions. His current weight is 273.7 lbs up 6.7 lbs from last 30 day review. He presented to the ED 7/3 with chest pain and increased SOB. All test were unremarkable and he was referred to his cardiologist. He has to be cleared by his cardiologist to return to CR. 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 He saw his endocrinologist 7/7 with A1C at 9.0%. He made changes to his insulin changing to Lantus. He is also on Jardiance, Januvia, Meftormin and Glipizide for DM control. His blood pressures have been at goal. Patient reported to the ED MD that he feels like he is retaining fluid but MD did not feel like he was in acute HF. Patient's attendance has been consistent with progressions. His perosnal goals continue to be to lose weight and improve his strenght and stamina. We will continue to monitor his progress when he returns to the program after being evaluated by cardiologist. Patient was cleared to return to CR by his [facility]ologist at [facility]. We received written notification. He returned today 03/31/22. Patient has completed 26 sessions. His current weight is 275.0 up 8 lbs from last 30 day review. He continues to do well in the program with consistent attendance and progressions. His blood pressure is well controlled. Reported  glucose readings average 50 mg/dl. He saw his cardiologist for follow up 8/3. Denied anymore chest pain. He increased his Torsemide to 80 mg for 3-5 days to get his weight to 270 lbs. He is follw up with BMP. His personal goals for the program continue to be to lose weight and improve his strength and stamina. We will continue to monitor his progress as he works towards meeting these goals.    Expected Outcomes Patient will complet the program meeting both personal and program goals. Patient will complet the program meeting both personal and program goals. Patient will complet the program meeting both personal and program goals. Patient will complet the program meeting both personal and program goals.             Core Components/Risk Factors/Patient Goals at Discharge (Final Review):   Goals and Risk Factor Review - 04/28/22 0821       Core Components/Risk Factors/Patient Goals Review   Personal Goals Review Weight Management/Obesity;Heart Failure;Hypertension    Review Patient has completed 26 sessions. His current weight is 275.0 up 8 lbs from last 30 day review. He continues to do well in the program with consistent attendance and progressions. His blood pressure is well controlled. Reported glucose readings average 50 mg/dl. He saw his cardiologist for follow up 8/3. Denied anymore chest pain. He increased his Torsemide to 80 mg for 3-5 days to get his weight to 270 lbs. He is follw up with BMP. His personal goals for the program continue to be to lose weight and improve his strength and stamina. We will continue to monitor his progress as he works towards meeting these goals.    Expected Outcomes Patient will complet the program meeting both personal and program goals.             ITP Comments:   Comments: ITP REVIEW Pt is making expected progress toward Cardiac Rehab goals  after completing 29 sessions. Recommend continued exercise, life style modification, education, and increased  stamina and strength.

## 2022-05-09 ENCOUNTER — Encounter (HOSPITAL_COMMUNITY)
Admission: RE | Admit: 2022-05-09 | Discharge: 2022-05-09 | Disposition: A | Discharge status: Home / Self Care | Payer: Medicaid Other | Source: Ambulatory Visit | Attending: Cardiology | Admitting: Cardiology

## 2022-05-09 DIAGNOSIS — I5022 Chronic systolic (congestive) heart failure: Secondary | ICD-10-CM

## 2022-05-09 NOTE — Progress Notes (Signed)
Daily Session Note  Patient Details  Name: Michael Vaughn MRN: [id number] Date of Birth: 02-Feb-1961 Referring Provider:   [name] from 02/13/2022 in [place]  Referring Provider Dr. Ula Lingo       Encounter Date: 05/09/2022  Check In:  Session Check In - 05/09/22 0930       Check-In   Supervising physician immediately available to respond to emergencies [facility] MD immediately available    Physician(s) [place]    Location [facility]    Staff Present Redge Gainer, BS, Exercise Physiologist;Caiya Bettes Wynetta Emery, RN, BSN    Virtual Visit No    Medication changes reported     No    Fall or balance concerns reported    No    Tobacco Cessation No Change    Warm-up and Cool-down Performed as group-led instruction    Resistance Training Performed Yes    VAD Patient? No    PAD/SET Patient? No      Pain Assessment   Currently in Pain? No/denies    Pain Score 0-No pain    Multiple Pain Sites No             Capillary Blood Glucose: No results found for this or any previous visit (from the past 24 hour(s)).    Social History   Tobacco Use  Smoking Status Former   Packs/day: 1.00   Years: 5.00   Total pack years: 5.00   Types: Cigarettes   Quit date: 09/22/1989   Years since quitting: 32.6  Smokeless Tobacco Former   Types: Snuff   Quit date: 09/22/1989  Tobacco Comments   dipped snuff for one year    Goals Met:  Independence with exercise equipment Exercise tolerated well No report of concerns or symptoms today Strength training completed today  Goals Unmet:  Not Applicable  Comments: Check out 1030.   Dr. Carlyle Dolly is Medical Director for [facility] Cardiac Rehab

## 2022-05-12 ENCOUNTER — Encounter (HOSPITAL_COMMUNITY)
Admission: RE | Admit: 2022-05-12 | Discharge: 2022-05-12 | Disposition: A | Discharge status: Home / Self Care | Payer: Medicaid Other | Source: Ambulatory Visit | Attending: Cardiology | Admitting: Cardiology

## 2022-05-12 DIAGNOSIS — I5022 Chronic systolic (congestive) heart failure: Secondary | ICD-10-CM

## 2022-05-12 NOTE — Progress Notes (Signed)
Daily Session Note  Patient Details  Name: Michael Vaughn MRN: [id number] Date of Birth: 10/11/1960 Referring Provider:   [name] from 02/13/2022 in [place]  Referring Provider Dr. Ula Lingo       Encounter Date: 05/12/2022  Check In:  Session Check In - 05/12/22 0919       Check-In   Supervising physician immediately available to respond to emergencies [facility] MD immediately available    Physician(s) Dr Harl Bowie    Staff Present Redge Gainer, BS, Exercise Physiologist;Kendrick Remigio Hassell Done, RN, BSN    Virtual Visit No    Medication changes reported     No    Fall or balance concerns reported    No    Tobacco Cessation No Change    Warm-up and Cool-down Performed as group-led instruction    Resistance Training Performed Yes    VAD Patient? No    PAD/SET Patient? No      Pain Assessment   Currently in Pain? No/denies    Pain Score 0-No pain    Multiple Pain Sites No             Capillary Blood Glucose: No results found for this or any previous visit (from the past 24 hour(s)).    Social History   Tobacco Use  Smoking Status Former   Packs/day: 1.00   Years: 5.00   Total pack years: 5.00   Types: Cigarettes   Quit date: 09/22/1989   Years since quitting: 32.6  Smokeless Tobacco Former   Types: Snuff   Quit date: 09/22/1989  Tobacco Comments   dipped snuff for one year    Goals Met:  Independence with exercise equipment Exercise tolerated well No report of concerns or symptoms today Strength training completed today  Goals Unmet:  Not Applicable  Comments: Checkout at 1030.   Dr. Carlyle Dolly is Medical Director for [facility] Cardiac Rehab

## 2022-05-14 ENCOUNTER — Encounter (HOSPITAL_COMMUNITY)
Admission: RE | Admit: 2022-05-14 | Discharge: 2022-05-14 | Disposition: A | Discharge status: Home / Self Care | Payer: Medicaid Other | Source: Ambulatory Visit | Attending: Cardiology | Admitting: Cardiology

## 2022-05-14 DIAGNOSIS — I5022 Chronic systolic (congestive) heart failure: Secondary | ICD-10-CM

## 2022-05-14 NOTE — Progress Notes (Signed)
Daily Session Note  Patient Details  Name: Michael Vaughn MRN: [id number] Date of Birth: 1961-04-15 Referring Provider:   [name] from 02/13/2022 in [place]  Referring Provider Dr. Ula Lingo       Encounter Date: 05/14/2022  Check In:  Session Check In - 05/14/22 0930       Check-In   Supervising physician immediately available to respond to emergencies [facility] MD immediately available    Physician(s) Dr. Audie Box    Location [facility]    Staff Present Redge Gainer, BS, Exercise Physiologist;Maximiliano Cromartie Kris Mouton, MS, ACSM-CEP, Exercise Physiologist;Daphyne Hassell Done, RN, BSN    Virtual Visit No    Medication changes reported     No    Fall or balance concerns reported    No    Tobacco Cessation No Change    Warm-up and Cool-down Performed as group-led instruction    Resistance Training Performed Yes    VAD Patient? No    PAD/SET Patient? No      Pain Assessment   Currently in Pain? No/denies    Pain Score 0-No pain    Multiple Pain Sites No             Capillary Blood Glucose: No results found for this or any previous visit (from the past 24 hour(s)).    Social History   Tobacco Use  Smoking Status Former   Packs/day: 1.00   Years: 5.00   Total pack years: 5.00   Types: Cigarettes   Quit date: 09/22/1989   Years since quitting: 32.6  Smokeless Tobacco Former   Types: Snuff   Quit date: 09/22/1989  Tobacco Comments   dipped snuff for one year    Goals Met:  Independence with exercise equipment Exercise tolerated well No report of concerns or symptoms today Strength training completed today  Goals Unmet:  Not Applicable  Comments: checkout time is 1030   Dr. Carlyle Dolly is Medical Director for [facility]

## 2022-05-16 ENCOUNTER — Encounter (HOSPITAL_COMMUNITY)
Admission: RE | Admit: 2022-05-16 | Discharge: 2022-05-16 | Disposition: A | Discharge status: Home / Self Care | Payer: Medicaid Other | Source: Ambulatory Visit | Attending: Cardiology | Admitting: Cardiology

## 2022-05-16 DIAGNOSIS — I5022 Chronic systolic (congestive) heart failure: Secondary | ICD-10-CM | POA: Diagnosis not present

## 2022-05-16 LAB — GLUCOSE, CAPILLARY: Glucose-Capillary: 110 mg/dL — ABNORMAL HIGH (ref 70–99)

## 2022-05-16 NOTE — Progress Notes (Signed)
Daily Session Note  Patient Details  Name: Michael Vaughn MRN: [id number] Date of Birth: 05/19/1961 Referring Provider:   [name] from 02/13/2022 in [place]  Referring Provider Dr. Ula Lingo       Encounter Date: 05/16/2022  Check In:  Session Check In - 05/16/22 0930       Check-In   Supervising physician immediately available to respond to emergencies [facility] MD immediately available    Physician(s) Dr. Phineas Inches    Location [facility]    Staff Present Redge Gainer, BS, Exercise Physiologist;Daphyne Hassell Done, RN, BSN    Virtual Visit No    Medication changes reported     No    Fall or balance concerns reported    No    Tobacco Cessation No Change    Warm-up and Cool-down Performed as group-led instruction    Resistance Training Performed Yes    VAD Patient? No    PAD/SET Patient? No      Pain Assessment   Currently in Pain? No/denies    Pain Score 0-No pain    Multiple Pain Sites No             Capillary Blood Glucose: No results found for this or any previous visit (from the past 24 hour(s)).    Social History   Tobacco Use  Smoking Status Former   Packs/day: 1.00   Years: 5.00   Total pack years: 5.00   Types: Cigarettes   Quit date: 09/22/1989   Years since quitting: 32.6  Smokeless Tobacco Former   Types: Snuff   Quit date: 09/22/1989  Tobacco Comments   dipped snuff for one year    Goals Met:  Independence with exercise equipment Exercise tolerated well No report of concerns or symptoms today Strength training completed today  Goals Unmet:  Not Applicable  Comments: check out 1030   Dr. Carlyle Dolly is Medical Director for [facility]

## 2022-05-19 ENCOUNTER — Encounter (HOSPITAL_COMMUNITY)
Admission: RE | Admit: 2022-05-19 | Discharge: 2022-05-19 | Disposition: A | Discharge status: Home / Self Care | Payer: Medicaid Other | Source: Ambulatory Visit | Attending: Cardiology | Admitting: Cardiology

## 2022-05-19 VITALS — Ht 70.0 in | Wt 273.1 lb

## 2022-05-19 DIAGNOSIS — I5022 Chronic systolic (congestive) heart failure: Secondary | ICD-10-CM | POA: Diagnosis not present

## 2022-05-19 NOTE — Progress Notes (Signed)
Daily Session Note  Patient Details  Name: Michael Vaughn MRN: [id number] Date of Birth: 06-17-1961 Referring Provider:   [name] from 02/13/2022 in [place]  Referring Provider Dr. Ula Lingo       Encounter Date: 05/19/2022  Check In:  Session Check In - 05/19/22 0930       Check-In   Supervising physician immediately available to respond to emergencies [facility] MD immediately available    Physician(s) Dr. Marlou Porch    Location [facility]    Staff Present Redge Gainer, BS, Exercise Physiologist;Daphyne Hassell Done, RN, Jennye Moccasin, RN, BSN    Virtual Visit No    Medication changes reported     No    Fall or balance concerns reported    No    Tobacco Cessation No Change    Warm-up and Cool-down Performed as group-led instruction    Resistance Training Performed Yes    VAD Patient? No    PAD/SET Patient? No      Pain Assessment   Currently in Pain? No/denies    Pain Score 0-No pain    Multiple Pain Sites No             Capillary Blood Glucose: No results found for this or any previous visit (from the past 24 hour(s)).    Social History   Tobacco Use  Smoking Status Former   Packs/day: 1.00   Years: 5.00   Total pack years: 5.00   Types: Cigarettes   Quit date: 09/22/1989   Years since quitting: 32.6  Smokeless Tobacco Former   Types: Snuff   Quit date: 09/22/1989  Tobacco Comments   dipped snuff for one year    Goals Met:  Independence with exercise equipment Exercise tolerated well No report of concerns or symptoms today Strength training completed today  Goals Unmet:  Not Applicable  Comments: Check out 1030.   Dr. Carlyle Dolly is Medical Director for [facility]

## 2022-05-20 NOTE — Progress Notes (Signed)
Discharge Progress Report  Patient Details  Name: Michael Vaughn MRN: [id number] Date of Birth: 05/01/1961 Referring Provider:   [name] from 02/13/2022 in [place]  Referring Provider Dr. Ula Lingo        Number of Visits: 35  Reason for Discharge:  Patient reached a stable level of exercise. Patient independent in their exercise. Patient has met program and personal goals.  Smoking History:  Social History   Tobacco Use  Smoking Status Former   Packs/day: 1.00   Years: 5.00   Total pack years: 5.00   Types: Cigarettes   Quit date: 09/22/1989   Years since quitting: 32.6  Smokeless Tobacco Former   Types: Snuff   Quit date: 09/22/1989  Tobacco Comments   dipped snuff for one year    Diagnosis:  Chronic systolic heart failure ([place])  ADL [facility]:   Initial Exercise Prescription:  Initial Exercise Prescription - 02/13/22 0900       Date of Initial Exercise RX and Referring Provider   Date 02/13/22    Referring Provider Dr. Ula Lingo    Expected Discharge Date 05/12/22      Treadmill   MPH 1.6    Grade 0    Minutes 17      NuStep   Level 1    SPM 70    Minutes 22      Prescription Details   Frequency (times per week) 3    Duration Progress to 30 minutes of continuous aerobic without signs/symptoms of physical distress      Intensity   THRR 40-80% of Max Heartrate 64-128    Ratings of Perceived Exertion 11-13      Resistance Training   Training Prescription Yes    Weight 4    Reps 10-15             Discharge Exercise Prescription (Final Exercise Prescription Changes):  Exercise Prescription Changes - 05/05/22 1200       Response to Exercise   Blood Pressure (Admit) 108/58    Blood Pressure (Exercise) 128/60    Blood Pressure (Exit) 110/60    Heart Rate (Admit) 72 bpm    Heart Rate (Exercise) 120 bpm    Heart Rate (Exit) 84 bpm    Rating of Perceived Exertion (Exercise) 13     Duration Continue with 30 min of aerobic exercise without signs/symptoms of physical distress.    Intensity THRR unchanged      Progression   Progression Continue to progress workloads to maintain intensity without signs/symptoms of physical distress.      Resistance Training   Training Prescription Yes    Weight 10    Reps 10-15    Time 10 Minutes      Treadmill   MPH 2.4    Grade 0    Minutes 22    METs 2.84      NuStep   Level 5    SPM 112    Minutes 17    METs 2.89             Functional Capacity:  6 Minute Walk     Row Name 02/13/22 0922 05/19/22 0959       6 Minute Walk   Phase Initial Discharge    Distance 1400 feet 1600 feet    Distance Feet Change -- 200 ft    Walk Time 6 minutes 6 minutes    # of Rest Breaks 0 0  MPH 2.65 3.03    METS 2.84 3.49    RPE 11 12    VO2 Peak 9.94 12.23    Symptoms No No    Resting HR 76 bpm 79 bpm    Resting BP 110/62 110/60    Resting Oxygen Saturation  98 % 99 %    Exercise Oxygen Saturation  during 6 min walk 98 % 97 %    Max Ex. HR 95 bpm 108 bpm    Max Ex. BP 120/60 140/60    2 Minute Post BP 116/60 112/60             Psychological, QOL, Others - Outcomes: PHQ 2/9:    02/13/2022    8:49 AM 08/06/2018    3:01 PM  Depression screen PHQ 2/9  Decreased Interest 0 0  Down, Depressed, Hopeless 0 0  PHQ - 2 Score 0 0  Altered sleeping 1 1  Tired, decreased energy 3 1  Change in appetite 0 0  Feeling bad or failure about yourself  0 0  Trouble concentrating 0 0  Moving slowly or fidgety/restless 0 0  Suicidal thoughts 0   PHQ-9 Score 4 2  Difficult doing work/chores Somewhat difficult     Quality of Life:  Quality of Life - 05/19/22 1000       Quality of Life   Select Quality of Life      Quality of Life Scores   Health/Function Pre 18.4 %    Health/Function Post 19.71 %    Health/Function % Change 7.12 %    Socioeconomic Pre 20.57 %    Socioeconomic Post 18.79 %    Socioeconomic %  Change  -8.65 %    Psych/Spiritual Pre 24.86 %    Psych/Spiritual Post 22.29 %    Psych/Spiritual % Change -10.34 %    Family Pre 22.8 %    Family Post 22.8 %    Family % Change 0 %    GLOBAL Pre 20.82 %    GLOBAL Post 20.53 %    GLOBAL % Change -1.39 %             Personal Goals: Goals established at orientation with interventions provided to work toward goal.  Personal Goals and Risk Factors at Admission - 02/13/22 0855       Core Components/Risk Factors/Patient Goals on Admission    Weight Management Yes;Obesity;Weight Loss    Intervention Weight Management: Develop a combined nutrition and exercise program designed to reach desired caloric intake, while maintaining appropriate intake of nutrient and fiber, sodium and fats, and appropriate energy expenditure required for the weight goal.;Weight Management: Provide education and appropriate resources to help participant work on and attain dietary goals.;Weight Management/Obesity: Establish reasonable short term and long term weight goals.;Obesity: Provide education and appropriate resources to help participant work on and attain dietary goals.    Expected Outcomes Short Term: Continue to assess and modify interventions until short term weight is achieved;Long Term: Adherence to nutrition and physical activity/exercise program aimed toward attainment of established weight goal;Weight Maintenance: Understanding of the daily nutrition guidelines, which includes 25-35% calories from fat, 7% or less cal from saturated fats, less than 234m cholesterol, less than 1.5gm of sodium, & 5 or more servings of fruits and vegetables daily;Weight Loss: Understanding of general recommendations for a balanced deficit meal plan, which promotes 1-2 lb weight loss per week and includes a negative energy balance of [phone] kcal/d;Understanding recommendations for meals to include 15-35% energy as  protein, 25-35% energy from fat, 35-60% energy from  carbohydrates, less than 283m of dietary cholesterol, 20-35 gm of total fiber daily;Understanding of distribution of calorie intake throughout the day with the consumption of 4-5 meals/snacks    Diabetes Yes    Intervention Provide education about signs/symptoms and action to take for hypo/hyperglycemia.;Provide education about proper nutrition, including hydration, and aerobic/resistive exercise prescription along with prescribed medications to achieve blood glucose in normal ranges: Fasting glucose 65-99 mg/dL    Expected Outcomes Short Term: Participant verbalizes understanding of the signs/symptoms and immediate care of hyper/hypoglycemia, proper foot care and importance of medication, aerobic/resistive exercise and nutrition plan for blood glucose control.;Long Term: Attainment of HbA1C < 7%.    Heart Failure Yes    Intervention Provide a combined exercise and nutrition program that is supplemented with education, support and counseling about heart failure. Directed toward relieving symptoms such as shortness of breath, decreased exercise tolerance, and extremity edema.    Expected Outcomes Improve functional capacity of life;Short term: Attendance in program 2-3 days a week with increased exercise capacity. Reported lower sodium intake. Reported increased fruit and vegetable intake. Reports medication compliance.;Short term: Daily weights obtained and reported for increase. Utilizing diuretic protocols set by physician.;Long term: Adoption of self-care skills and reduction of barriers for early signs and symptoms recognition and intervention leading to self-care maintenance.    Hypertension Yes    Intervention Provide education on lifestyle modifcations including regular physical activity/exercise, weight management, moderate sodium restriction and increased consumption of fresh fruit, vegetables, and low fat dairy, alcohol moderation, and smoking cessation.;Monitor prescription use compliance.     Expected Outcomes Short Term: Continued assessment and intervention until BP is < 140/935mHG in hypertensive participants. < 130/8052mG in hypertensive participants with diabetes, heart failure or chronic kidney disease.;Long Term: Maintenance of blood pressure at goal levels.    Personal Goal Other Yes    Personal Goal Increase strength and stamina    Intervention Attend CR 3 days per week and exercise at home 2-4 days per week    Expected Outcomes Pt will report improvement in his strength and stamina and his six minute walk test distance will increase.              Personal Goals Discharge:  Goals and Risk Factor Review     Row Name 03/03/22 0916 03/31/22 0756 03/31/22 1229 04/28/22 0821 05/20/22 1518     Core Components/Risk Factors/Patient Goals Review   Personal Goals Review Weight Management/Obesity;Heart Failure;Hypertension Weight Management/Obesity;Heart Failure;Hypertension -- Weight Management/Obesity;Heart Failure;Hypertension Weight Management/Obesity;Heart Failure;Hypertension   Review Patient was referred to CR with chronic systolic HF. He has multiple risk factors for CAD and is participating in the program for risk modification.  He has complete 5 sessions. His current weight is 278 up 11 lbs from his initial visit. He saw his cardiologist at [facility] for HF management. No changes were made. His last A1C from those notes was 8.8%. He is on Metformin for DM management. His home glucose readings average 170 g/dl. His blood pressue is at goal. He reported to his cardiologist he was having less SOB and is feeling stronger. His personal goals for the program are to lose weight and improve his strength and stamina. We will continue to monitor his progress as he works towards meeting these goals. Patient has completed 14 sessions. His current weight is 273.7 lbs up 6.7 lbs from last 30 day review. He presented to the ED 7/3 with chest pain  and increased SOB. All test were  unremarkable and he was referred to his cardiologist. He has to be cleared by his cardiologist to return to CR. 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 He saw his endocrinologist 7/7 with A1C at 9.0%. He made changes to his insulin changing to Lantus. He is also on Jardiance, Januvia, Meftormin and Glipizide for DM control. His blood pressures have been at goal. Patient reported to the ED MD that he feels like he is retaining fluid but MD did not feel like he was in acute HF. Patient's attendance has been consistent with progressions. His perosnal goals continue to be to lose weight and improve his strenght and stamina. We will continue to monitor his progress when he returns to the program after being evaluated by cardiologist. Patient was cleared to return to CR by his cardiologist at [facility]. We received written notification. He returned today 03/31/22. Patient has completed 26 sessions. His current weight is 275.0 up 8 lbs from last 30 day review. He continues to do well in the program with consistent attendance and progressions. His blood pressure is well controlled. Reported glucose readings average 50 mg/dl. He saw his cardiologist for follow up 8/3. Denied anymore chest pain. He increased his Torsemide to 80 mg for 3-5 days to get his weight to 270 lbs. He is follw up with BMP. His personal goals for the program continue to be to lose weight and improve his strength and stamina. We will continue to monitor his progress as he works towards meeting these goals. Pt graduated from CR after 35 sessions. His weight fluctuated throughout the program, and he became volume overloaded a few times which resulted in him having to take extra doses of Lasix. Overall, he lost 3 lbs while in the program. His vitals were WNLs while he was in the program. He has not had anymore chest pain and reports that he has been feeling well overall. He was able to improve  his walk test distance by 14.3%. He reports that he feels like his strength and stamina improved while he was in the program.   Expected Outcomes Patient will complet the program meeting both personal and program goals. Patient will complet the program meeting both personal and program goals. Patient will complet the program meeting both personal and program goals. Patient will complet the program meeting both personal and program goals. Pt will continue to work towards their goals post discharge.            Exercise Goals and Review:  Exercise Goals     Row Name 02/13/22 5638 03/10/22 1102 04/07/22 1045 05/05/22 1252       Exercise Goals   Increase Physical Activity Yes Yes Yes Yes    Intervention Develop an individualized exercise prescription for aerobic and resistive training based on initial evaluation findings, risk stratification, comorbidities and participant's personal goals.;Provide advice, education, support and counseling about physical activity/exercise needs. Develop an individualized exercise prescription for aerobic and resistive training based on initial evaluation findings, risk stratification, comorbidities and participant's personal goals.;Provide advice, education, support and counseling about physical activity/exercise needs. Develop an individualized exercise prescription for aerobic and resistive training based on initial evaluation findings, risk stratification, comorbidities and participant's personal goals.;Provide advice, education, support and counseling about physical activity/exercise needs. Develop an individualized exercise prescription for aerobic and resistive training based on initial evaluation findings, risk stratification, comorbidities and  participant's personal goals.;Provide advice, education, support and counseling about physical activity/exercise needs.    Expected Outcomes Short Term: Attend rehab on a regular basis to increase amount of physical  activity.;Long Term: Exercising regularly at least 3-5 days a week.;Long Term: Add in home exercise to make exercise part of routine and to increase amount of physical activity. Short Term: Attend rehab on a regular basis to increase amount of physical activity.;Long Term: Exercising regularly at least 3-5 days a week.;Long Term: Add in home exercise to make exercise part of routine and to increase amount of physical activity. Short Term: Attend rehab on a regular basis to increase amount of physical activity.;Long Term: Exercising regularly at least 3-5 days a week.;Long Term: Add in home exercise to make exercise part of routine and to increase amount of physical activity. Short Term: Attend rehab on a regular basis to increase amount of physical activity.;Long Term: Exercising regularly at least 3-5 days a week.;Long Term: Add in home exercise to make exercise part of routine and to increase amount of physical activity.    Increase Strength and Stamina Yes Yes Yes Yes    Intervention Provide advice, education, support and counseling about physical activity/exercise needs.;Develop an individualized exercise prescription for aerobic and resistive training based on initial evaluation findings, risk stratification, comorbidities and participant's personal goals. Provide advice, education, support and counseling about physical activity/exercise needs.;Develop an individualized exercise prescription for aerobic and resistive training based on initial evaluation findings, risk stratification, comorbidities and participant's personal goals. Provide advice, education, support and counseling about physical activity/exercise needs.;Develop an individualized exercise prescription for aerobic and resistive training based on initial evaluation findings, risk stratification, comorbidities and participant's personal goals. Provide advice, education, support and counseling about physical activity/exercise needs.;Develop an  individualized exercise prescription for aerobic and resistive training based on initial evaluation findings, risk stratification, comorbidities and participant's personal goals.    Expected Outcomes Short Term: Perform resistance training exercises routinely during rehab and add in resistance training at home;Short Term: Increase workloads from initial exercise prescription for resistance, speed, and METs.;Long Term: Improve cardiorespiratory fitness, muscular endurance and strength as measured by increased METs and functional capacity (6MWT) Short Term: Perform resistance training exercises routinely during rehab and add in resistance training at home;Short Term: Increase workloads from initial exercise prescription for resistance, speed, and METs.;Long Term: Improve cardiorespiratory fitness, muscular endurance and strength as measured by increased METs and functional capacity (6MWT) Short Term: Perform resistance training exercises routinely during rehab and add in resistance training at home;Short Term: Increase workloads from initial exercise prescription for resistance, speed, and METs.;Long Term: Improve cardiorespiratory fitness, muscular endurance and strength as measured by increased METs and functional capacity (6MWT) Short Term: Perform resistance training exercises routinely during rehab and add in resistance training at home;Short Term: Increase workloads from initial exercise prescription for resistance, speed, and METs.;Long Term: Improve cardiorespiratory fitness, muscular endurance and strength as measured by increased METs and functional capacity (6MWT)    Able to understand and use rate of perceived exertion (RPE) scale Yes Yes Yes Yes    Intervention Provide education and explanation on how to use RPE scale Provide education and explanation on how to use RPE scale Provide education and explanation on how to use RPE scale Provide education and explanation on how to use RPE scale    Expected  Outcomes Short Term: Able to use RPE daily in rehab to express subjective intensity level;Long Term:  Able to use RPE to guide intensity level when  exercising independently Short Term: Able to use RPE daily in rehab to express subjective intensity level;Long Term:  Able to use RPE to guide intensity level when exercising independently Short Term: Able to use RPE daily in rehab to express subjective intensity level;Long Term:  Able to use RPE to guide intensity level when exercising independently Short Term: Able to use RPE daily in rehab to express subjective intensity level;Long Term:  Able to use RPE to guide intensity level when exercising independently    Knowledge and understanding of Target Heart Rate Range (THRR) Yes Yes Yes Yes    Intervention Provide education and explanation of THRR including how the numbers were predicted and where they are located for reference Provide education and explanation of THRR including how the numbers were predicted and where they are located for reference Provide education and explanation of THRR including how the numbers were predicted and where they are located for reference Provide education and explanation of THRR including how the numbers were predicted and where they are located for reference    Expected Outcomes Short Term: Able to state/look up THRR;Long Term: Able to use THRR to govern intensity when exercising independently;Short Term: Able to use daily as guideline for intensity in rehab Short Term: Able to state/look up THRR;Long Term: Able to use THRR to govern intensity when exercising independently;Short Term: Able to use daily as guideline for intensity in rehab Short Term: Able to state/look up THRR;Long Term: Able to use THRR to govern intensity when exercising independently;Short Term: Able to use daily as guideline for intensity in rehab Short Term: Able to state/look up THRR;Long Term: Able to use THRR to govern intensity when exercising  independently;Short Term: Able to use daily as guideline for intensity in rehab    Able to check pulse independently Yes Yes Yes Yes    Intervention Provide education and demonstration on how to check pulse in carotid and radial arteries.;Review the importance of being able to check your own pulse for safety during independent exercise Provide education and demonstration on how to check pulse in carotid and radial arteries.;Review the importance of being able to check your own pulse for safety during independent exercise Provide education and demonstration on how to check pulse in carotid and radial arteries.;Review the importance of being able to check your own pulse for safety during independent exercise Provide education and demonstration on how to check pulse in carotid and radial arteries.;Review the importance of being able to check your own pulse for safety during independent exercise    Expected Outcomes Short Term: Able to explain why pulse checking is important during independent exercise;Long Term: Able to check pulse independently and accurately Short Term: Able to explain why pulse checking is important during independent exercise;Long Term: Able to check pulse independently and accurately Short Term: Able to explain why pulse checking is important during independent exercise;Long Term: Able to check pulse independently and accurately Short Term: Able to explain why pulse checking is important during independent exercise;Long Term: Able to check pulse independently and accurately    Understanding of Exercise Prescription Yes Yes Yes Yes    Intervention Provide education, explanation, and written materials on patient's individual exercise prescription Provide education, explanation, and written materials on patient's individual exercise prescription Provide education, explanation, and written materials on patient's individual exercise prescription Provide education, explanation, and written  materials on patient's individual exercise prescription    Expected Outcomes Short Term: Able to explain program exercise prescription;Long Term: Able to explain home  exercise prescription to exercise independently Short Term: Able to explain program exercise prescription;Long Term: Able to explain home exercise prescription to exercise independently Short Term: Able to explain program exercise prescription;Long Term: Able to explain home exercise prescription to exercise independently Short Term: Able to explain program exercise prescription;Long Term: Able to explain home exercise prescription to exercise independently             Exercise Goals Re-Evaluation:  Exercise Goals Re-Evaluation     Row Name 03/10/22 1103 04/07/22 1045 05/05/22 1253         Exercise Goal Re-Evaluation   Exercise Goals Review Increase Physical Activity;Increase Strength and Stamina;Able to understand and use rate of perceived exertion (RPE) scale;Knowledge and understanding of Target Heart Rate Range (THRR);Able to check pulse independently;Understanding of Exercise Prescription Increase Physical Activity;Increase Strength and Stamina;Able to understand and use rate of perceived exertion (RPE) scale;Knowledge and understanding of Target Heart Rate Range (THRR);Able to check pulse independently;Understanding of Exercise Prescription Increase Strength and Stamina;Increase Physical Activity;Able to understand and use rate of perceived exertion (RPE) scale;Knowledge and understanding of Target Heart Rate Range (THRR);Able to check pulse independently;Understanding of Exercise Prescription     Comments Pt has completed 9 sessions of CR. He is motivated to improve himself and he has been able to progress his workloads. He is currently exercising at 2.69 METs on the treadmill. Will continue to monitor and progress as able. Pt has completed 18 sessions of cardiac rehab. He continues to be motivated during class and progressing  his workloads. He is currently exercising at 2.56 METs on the stepper. Will continue to monitor and progress as able. Pt has compeleted 29 sessions of cardiac rehab. He continues to increase his workload during class and is motivated. He is currently exercising at 2.89 METs on the stepper. Will continue to monitor and progress as able.     Expected Outcomes Through exercise at rehab and at home, pt will meet their stated goals. Through exercise at rehab and at home, pt will meet their stated goals. Through exercise at rehab and at home, pt will meet their stated goals.              Nutrition & Weight - Outcomes:  Pre Biometrics - 02/13/22 1610       Pre Biometrics   Height '5\' 11"'  (1.803 m)    Weight 276 lb 3.8 oz (125.3 kg)    Waist Circumference 52 inches    Hip Circumference 46 inches    Waist to Hip Ratio 1.13 %    BMI (Calculated) 38.54    Triceps Skinfold 15 mm    % Body Fat 36.6 %    Grip Strength 36.9 kg    Flexibility 0 in    Single Leg Stand 23 seconds             Post Biometrics - 05/19/22 1000        Post  Biometrics   Height '5\' 10"'  (1.778 m)    Weight 273 lb 2.4 oz (123.9 kg)    Waist Circumference 51 inches    Hip Circumference 46 inches    Waist to Hip Ratio 1.11 %    BMI (Calculated) 39.19    Triceps Skinfold 18 mm    % Body Fat 36.7 %    Grip Strength 34.1 kg    Flexibility 0 in    Single Leg Stand 45 seconds             Nutrition:  Nutrition Therapy & Goals - 03/03/22 0913       Personal Nutrition Goals   Comments Patient scored 24 on his diet assessment. We offer 2 educational sessions on heart healthy nutrition with handouts and assistance with RD referral if patient is interested.      Intervention Plan   Intervention Nutrition handout(s) given to patient.    Expected Outcomes Short Term Goal: Understand basic principles of dietary content, such as calories, fat, sodium, cholesterol and nutrients.             Nutrition  Discharge:  Nutrition Assessments - 05/20/22 1515       MEDFICTS Scores   Pre Score 24    Post Score 6    Score Difference -18             Education Questionnaire Score:  Knowledge Questionnaire Score - 05/20/22 1513       Knowledge Questionnaire Score   Pre Score 20/24    Post Score 22/24             Goals reviewed with patient; copy given to patient. Pt graduated from CR after 35 sessions. He was able to improve his walk test distance by 14.3%, and his MET level at discharge was 3.5. He reports that he will continue to exercise by joining the [facility] again.

## 2022-11-27 ENCOUNTER — Encounter: Payer: Self-pay | Admitting: Orthopaedic Surgery

## 2022-11-27 ENCOUNTER — Ambulatory Visit: Payer: Medicaid Other | Admitting: Orthopaedic Surgery

## 2022-11-27 VITALS — Ht 71.0 in | Wt 273.0 lb

## 2022-11-27 DIAGNOSIS — M79671 Pain in right foot: Secondary | ICD-10-CM

## 2022-11-28 DIAGNOSIS — M79671 Pain in right foot: Secondary | ICD-10-CM | POA: Insufficient documentation

## 2022-11-28 NOTE — Progress Notes (Signed)
Office Visit Note   Patient: Michael Vaughn           Date of Birth: [birth year]           MRN: [id number] Visit Date: 11/27/2022              Requested by: Neale Burly, MD [place],  [place] [id number] PCP: Neale Burly, MD   Assessment & Plan: Visit Diagnoses:  1. Pain in right foot     Plan: Patient return if he has increased size or increased discomfort.  We discussed importance of improving his diabetes and lowering his A1c.  He will call make appointment if he has increased problems.  Follow-Up Instructions: No follow-ups on file.   Orders:  No orders of the defined types were placed in this encounter.  No orders of the defined types were placed in this encounter.     Procedures: No procedures performed   Clinical Data: No additional findings.   Subjective: Chief Complaint  Patient presents with   Right Foot - Pain    HPI 62 year old male with right foot pain not laterally over the foot adjacent to the base of the fifth metatarsal at the peroneal brevis insertion site 4 to 5 mm.  He has noticed about a month ago has some mild pain with that occasionally has noticed limping.  He notes some increased discomfort after walking for a while.  He has stage III kidney disease.  Type 2 diabetes ischemic cardiomyopathy.  He takes from for gout.  A1c has been greater than 8.  Previous x-rays were negative.  Review of Systems all systems are noncontributory to HPI.   Objective: Vital Signs: Ht '5\' 11"'$  (1.803 m)   Wt 273 lb (123.8 kg)   BMI 38.08 kg/m   Physical Exam Constitutional:      Appearance: He is well-developed.  HENT:     Head: Normocephalic and atraumatic.     Right Ear: External ear normal.     Left Ear: External ear normal.  Eyes:     Pupils: Pupils are equal, round, and reactive to light.  Neck:     Thyroid: No thyromegaly.     Trachea: No tracheal deviation.  Cardiovascular:     Rate and Rhythm: Normal rate.  Pulmonary:      Effort: Pulmonary effort is normal.     Breath sounds: No wheezing.  Abdominal:     General: Bowel sounds are normal.     Palpations: Abdomen is soft.  Musculoskeletal:     Cervical back: Neck supple.  Skin:    General: Skin is warm and dry.     Capillary Refill: Capillary refill takes less than 2 seconds.  Neurological:     Mental Status: He is alert and oriented to person, place, and time.  Psychiatric:        Behavior: Behavior normal.        Thought Content: Thought content normal.        Judgment: Judgment normal.     Ortho Exam patient has tiny nodule adjacent to peroneal insertion site ballotable consistent with small ganglion.  No tenderness of the base of the fifth no callus over the plantar surface he is able to ambulate without pain no limping peroneal takes good strength.  Specialty Comments:  No specialty comments available.  Imaging: No results found.   PMFS History: Patient Active Problem List   Diagnosis Date Noted   Pain in right  foot 11/28/2022   Type 2 diabetes mellitus ([place]) 08/13/2017   Anemia [id number]   Chronic systolic CHF (congestive heart failure) ([place]) 08/13/2017   History of ventricular tachycardia 08/13/2017   Chest pain 08/04/2017   Herniated nucleus pulposus, L2-3 01/22/2016   Sleep apnea 06/27/2011   Shortness of breath    Hypertension    Dyslipidemia    GERD (gastroesophageal reflux disease)    Anomalous right coronary artery    Morbid obesity ([place])    Lumbar disc disease    Bradycardia    Nonischemic cardiomyopathy ([place])    CAD (coronary artery disease)    Ejection fraction    Past Medical History:  Diagnosis Date   Anginal pain ([place])    Anomalous right coronary artery    from left coronary cusp   BPH (benign prostatic hyperplasia)    Bradycardia    July, 2012, related to medication   CAD (coronary artery disease)    Some coronary irregularities by catheterization 2006 /  nuclear, July, 20 12  ,  question of some ischemia  in the lateral wall although technically quite difficult.   Diabetes mellitus    Dyslipidemia    Dysrhythmia    Ejection fraction    Improved from the past  /  ejection fraction 50%, echo, July, 2012, hypokinesis at the base of the inferior wall.   GERD (gastroesophageal reflux disease)    Headache    History of hiatal hernia    Hypertension    Hypertension    Lumbar disc disease    Morbid obesity ([place])    Myocardial infarction (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 Nonobstructive coronary artery disease.   COLONOSCOPY     ESOPHAGOGASTRODUODENOSCOPY     LUMBAR LAMINECTOMY/ DECOMPRESSION WITH MET-RX Bilateral 01/22/2016   Procedure: Left Lumbar Two-THree Microdiskectomy and Right Lumbar Two-Three Extraforaminal with Met-RX;  Surgeon: Kristeen Miss, MD;  Location: [facility] NEURO ORS;  Service: Neurosurgery;  Laterality: Bilateral;  Left L2-3 Microdiskectomy/Right L2-3 Extraforaminal with Met-RX   none     no surgeries noted   triple bipass     WISDOM TOOTH EXTRACTION     Social History   Occupational History   Occupation: DISABLED    Comment: disc disease,DM  Tobacco Use   Smoking status: Former    Packs/day: 1.00    Years: 5.00    Total pack years: 5.00    Types: Cigarettes    Quit date: 09/22/1989    Years since quitting: 33.2   Smokeless tobacco: Former    Types: Snuff    Quit date: 09/22/1989   Tobacco comments:    dipped snuff for  one year  Substance and Sexual Activity   Alcohol use: No   Drug use: No   Sexual activity: Not on file

## 2024-08-08 NOTE — Progress Notes (Signed)
 Obtained and reviewed BG with pt. A1c collected on 05/19/24 and is 7.5 Electronically signed by: Bascom Rosaline Dimes, CMA 08/08/2024 10:10 AM

## 2024-08-08 NOTE — Telephone Encounter (Signed)
 Mounjaro - PA Pending/Waiting on MD    The preferred formulary alternatives are:  Ozempic Victoza [employer] requires patient to tried/failed 2 of the above medication, if you would like me to start the prior authorization, I will need a reason why he can't take the preferred medication. Please let me know how to proceed.

## 2024-08-08 NOTE — Progress Notes (Signed)
 [facility] FOLLOW UP VISIT   CC/Reason for visit: Type 2 Diabetes Mellitus  HPI:  This is a 63 y.o. male who presents for a follow up visit.  The following issues were addressed in clinic today:   ASSESSMENT/PLAN: Michael Vaughn is a 63 y.o. male with a past medical history of above who presents for a follow up consultation visit.  Assessment & Plan 1. Type 2 diabetes mellitus: A1c goal for this patient is <7% without severe or recurrent hypoglycemia. A1c is likely in the range of 7.5 as noted on 05/19/2024. Agree with patient readiness to retrial increased dose of Mounjaro 7.5 mg weekly.  Plan: - Continue Lantus 35 units twice daily. - Increase Mounjaro to 7.5 mg weekly. - Continue metformin . - Recheck A1c with next blood draw. - Reviewed indications to decrease Lantus dose such as ongoing fasting blood sugars <100 mg/dL. - Advised to continue monitoring fasting blood sugars and encouraged to begin monitoring at bedtime as well. Follow-up: Recheck A1c with next blood draw.  There are no Patient Instructions on file for this visit.  Orders Placed This Encounter  Medications  . insulin  glargine (Lantus Solostar U-100 Insulin ) 100 unit/mL (3 mL) pen    Sig: Inject 40 Units under the skin 2 (two) times a day.    Dispense:  30 mL    Refill:  3  . metFORMIN  (GLUCOPHAGE ) 1,000 mg tablet    Sig: Take 1 tablet (1,000 mg total) by mouth in the morning and 1 tablet (1,000 mg total) in the evening. Take with meals.    Dispense:  180 tablet    Refill:  0  . tirzepatide (Mounjaro) 7.5 mg/0.5 mL subcutaneous pen injector    Sig: Inject 0.5 mL (7.5 mg total) under the skin every 7 days. Dx E 11.9    Dispense:  6 mL    Refill:  3   Orders Placed This Encounter  Procedures  . Hemoglobin A1C With Estimated Average Glucose    Return for T2DM.  Electronically signed by: Elveria Odetta Boop, PA-C 08/08/2024 12:14 PM [employer] Copilot tool used for dictation of note  for today's encounter. Ambient listening not used during visit.     1. Type 2 Diabetes Mellitus:   History of Present Illness The patient is a 63 year old male who presents for a follow-up on type 2 diabetes, currently managed with Lantus, metformin , and Mounjaro. Since the last visit, he decreased his Mounjaro to 5 mg due to feeling better on this dose. He has no home blood sugar readings with him today.  He reports an overall improvement in his health status. Over the past week, he has observed an increase in his appetite. He had previously experienced difficulties with a 7.5 mg dosage of Mounjaro, as prescribed by Dr. Margrette, which led to a reduction in the dosage to 5 mg. However, he decided to attempt the 7.5 mg dosage again yesterday, and so far, he has tolerated it well.  Regarding his Lantus, he decreased the dose to 35 units twice daily as he was having some lower fasting blood sugars. This morning, he woke up with a blood sugar of 91 after increasing the Mounjaro dose and was asymptomatic.  He had one hospitalization to the cardiology floor in 05/2024 and since then has been doing well. He had a brief period of volume overload in late 06/2024 that was managed outpatient. During that time, he noted a slight increase in blood sugars, but that has since  normalized.  The patient currently uses the following regimen:  Lantus 35 units twice daily Farxiga 10 mg daily (per cardiology) Mounjaro 5 mg weekly Metformin  1,000 mg BID  Blood Glucose Monitoring: The patient did bring a glucometer or written blood sugars to review I have downloaded/reviewed the glucometer or written blood glucose readings.  Patient is not using a Continuous Glucose Monitor.  I have not downloaded and reviewed the CGM  Review of Systems: A complete ROS was obtained with pertinent positives/negatives noted in the HPI. The remained of the ROS are negative.  Objective: BP 114/76   Pulse 77   Temp 97.5 F  (36.4 C)   Ht 1.803 m (5' 11)   Wt 115 kg (253 lb)   SpO2 98%   BMI 35.29 kg/m   Physical Exam: Physical Exam General Appearance: Obese male, well appearing, in no acute distress. Head: Normocephalic and atraumatic. Eyes: Pupils equally round; no proptosis, lid lag, or stare; vision grossly intact. Thyroid : no visible goiter. Cardiovascular: Normal volume status. Respiratory: Normal respiratory effort; not respiratory distress. Abdominal: Non-distended. Musculoskeletal: no deficits noted. Extremities: No lower extremity edema. Skin: Warm and dry, no rash on exposed skin. Neurological: Alert, aware, answers questions appropriately; no tremor. Psychiatric: Pleasant mood, behavior normal.   Pertinent Labs: Results     Chemistry      Component Value Date/Time   NA 138 07/29/2024 0915   K 5.0 07/29/2024 0915   CL 96 (L) 07/29/2024 0915   CO2 31 07/29/2024 0915   BUN 28 (H) 07/29/2024 0915   CREATININE 1.99 (H) 07/29/2024 0915      Component Value Date/Time   CALCIUM 9.9 07/29/2024 0915   AST 36 05/06/2023 1415   ALT 41 05/06/2023 1415   BILITOT 0.8 05/06/2023 1415       Lab Results  Component Value Date   CHOL 63 06/10/2024   TRIG 144 06/10/2024   HDL 25 (L) 06/10/2024    Lab Results  Component Value Date   POCA1C 7.3 (A) 02/03/2024   HGBA1C 7.5 (H) 05/19/2024   HGBA1C 7.5 (H) 05/01/2017    No components found for: UALBGCRT  TSH  Date Value Ref Range Status  06/10/2024 1.930 0.450 - 5.330 uIU/mL Final    No components found for: VB12  No results found for: GAD65, CPEPTIDE

## 2024-08-08 NOTE — Telephone Encounter (Signed)
 Mounjaro was previously authorized after trying and failing Ozempic and Bydureon (both of which were preferred options at the time). We are now requesting continuation of therapy which has proven effective for him both in tolerability and control of blood sugars.

## 2024-08-09 NOTE — Telephone Encounter (Signed)
 Mounjaro was denied (will not be able to see the denial letter until tomorrow when it updates in insurance portal)  You can call and do an appeal- [phone]

## 2024-08-10 ENCOUNTER — Encounter (HOSPITAL_COMMUNITY)
Admission: RE | Admit: 2024-08-10 | Discharge: 2024-08-10 | Disposition: A | Discharge status: Home / Self Care | Source: Ambulatory Visit | Attending: Internal Medicine | Admitting: Internal Medicine

## 2024-08-10 DIAGNOSIS — I214 Non-ST elevation (NSTEMI) myocardial infarction: Secondary | ICD-10-CM | POA: Insufficient documentation

## 2024-08-10 NOTE — Progress Notes (Signed)
 Completed virtual orientation today.  EP evaluation is scheduled for 08/15/24 at 2:15 pm.  Documentation for diagnosis can be found in [facility] encounter.

## 2024-08-15 ENCOUNTER — Encounter (HOSPITAL_COMMUNITY)
Admission: RE | Admit: 2024-08-15 | Discharge: 2024-08-15 | Disposition: A | Discharge status: Home / Self Care | Source: Ambulatory Visit | Attending: Internal Medicine | Admitting: Internal Medicine

## 2024-08-15 VITALS — Ht 70.5 in | Wt 251.3 lb

## 2024-08-15 DIAGNOSIS — I214 Non-ST elevation (NSTEMI) myocardial infarction: Secondary | ICD-10-CM | POA: Diagnosis present

## 2024-08-15 NOTE — Patient Instructions (Signed)
 Patient Instructions  Patient Details  Name: Michael Vaughn MRN: [id number] Date of Birth: 1960-10-16 Referring Provider:  Vasu, Sujethra, MD  Below are your personal goals for exercise, nutrition, and risk factors. Our goal is to help you stay on track towards obtaining and maintaining these goals. We will be discussing your progress on these goals with you throughout the program.  Initial Exercise Prescription:  Initial Exercise Prescription - 08/15/24 1400       Date of Initial Exercise RX and Referring Provider   Date 08/15/24    Referring Provider Irven Alberta MD      Treadmill   MPH 1.5    Grade 0    Minutes 15    METs 2.15      NuStep   Level 1    SPM 50    Minutes 15    METs 1.9      Prescription Details   Frequency (times per week) 3    Duration Progress to 30 minutes of continuous aerobic without signs/symptoms of physical distress      Intensity   THRR 40-80% of Max Heartrate 141-108    Ratings of Perceived Exertion 11-13    Perceived Dyspnea 0-4      Resistance Training   Training Prescription Yes    Weight 4.    Reps 10-15          Exercise Goals: Frequency: Be able to perform aerobic exercise two to three times per week in program working toward 2-5 days per week of home exercise.  Intensity: Work with a perceived exertion of 11 (fairly light) - 15 (hard) while following your exercise prescription.  We will make changes to your prescription with you as you progress through the program.   Duration: Be able to do 30 to 45 minutes of continuous aerobic exercise in addition to a 5 minute warm-up and a 5 minute cool-down routine.   Nutrition Goals: Your personal nutrition goals will be established when you do your nutrition analysis with the dietician.  The following are general nutrition guidelines to follow: Cholesterol < 200mg /day Sodium < 1500mg /day Fiber: Men over 50 yrs - 30 grams per day  Personal Goals:  Personal Goals and Risk Factors  at Admission - 08/10/24 1314       Core Components/Risk Factors/Patient Goals on Admission    Weight Management Yes;Weight Maintenance    Intervention Weight Management: Develop a combined nutrition and exercise program designed to reach desired caloric intake, while maintaining appropriate intake of nutrient and fiber, sodium and fats, and appropriate energy expenditure required for the weight goal.;Weight Management: Provide education and appropriate resources to help participant work on and attain dietary goals.;Weight Management/Obesity: Establish reasonable short term and long term weight goals.    Expected Outcomes Short Term: Continue to assess and modify interventions until short term weight is achieved;Long Term: Adherence to nutrition and physical activity/exercise program aimed toward attainment of established weight goal;Weight Maintenance: Understanding of the daily nutrition guidelines, which includes 25-35% calories from fat, 7% or less cal from saturated fats, less than 200mg  cholesterol, less than 1.5gm of sodium, & 5 or more servings of fruits and vegetables daily;Weight Loss: Understanding of general recommendations for a balanced deficit meal plan, which promotes 1-2 lb weight loss per week and includes a negative energy balance of [phone] kcal/d;Understanding recommendations for meals to include 15-35% energy as protein, 25-35% energy from fat, 35-60% energy from carbohydrates, less than 200mg  of dietary cholesterol, 20-35 gm of total fiber  daily;Understanding of distribution of calorie intake throughout the day with the consumption of 4-5 meals/snacks    Improve shortness of breath with ADL's Yes    Intervention Provide education, individualized exercise plan and daily activity instruction to help decrease symptoms of SOB with activities of daily living.    Expected Outcomes Short Term: Improve cardiorespiratory fitness to achieve a reduction of symptoms when performing ADLs;Long  Term: Be able to perform more ADLs without symptoms or delay the onset of symptoms    Diabetes Yes    Intervention Provide education about signs/symptoms and action to take for hypo/hyperglycemia.;Provide education about proper nutrition, including hydration, and aerobic/resistive exercise prescription along with prescribed medications to achieve blood glucose in normal ranges: Fasting glucose 65-99 mg/dL    Expected Outcomes Short Term: Participant verbalizes understanding of the signs/symptoms and immediate care of hyper/hypoglycemia, proper foot care and importance of medication, aerobic/resistive exercise and nutrition plan for blood glucose control.;Long Term: Attainment of HbA1C < 7%.    Heart Failure Yes    Intervention Provide a combined exercise and nutrition program that is supplemented with education, support and counseling about heart failure. Directed toward relieving symptoms such as shortness of breath, decreased exercise tolerance, and extremity edema.    Expected Outcomes Improve functional capacity of life;Short term: Attendance in program 2-3 days a week with increased exercise capacity. Reported lower sodium intake. Reported increased fruit and vegetable intake. Reports medication compliance.;Short term: Daily weights obtained and reported for increase. Utilizing diuretic protocols set by physician.;Long term: Adoption of self-care skills and reduction of barriers for early signs and symptoms recognition and intervention leading to self-care maintenance.    Hypertension Yes    Intervention Provide education on lifestyle modifcations including regular physical activity/exercise, weight management, moderate sodium restriction and increased consumption of fresh fruit, vegetables, and low fat dairy, alcohol moderation, and smoking cessation.;Monitor prescription use compliance.    Expected Outcomes Short Term: Continued assessment and intervention until BP is < 140/75mm HG in hypertensive  participants. < 130/36mm HG in hypertensive participants with diabetes, heart failure or chronic kidney disease.;Long Term: Maintenance of blood pressure at goal levels.    Lipids Yes    Intervention Provide education and support for participant on nutrition & aerobic/resistive exercise along with prescribed medications to achieve LDL 70mg , HDL >40mg .    Expected Outcomes Short Term: Participant states understanding of desired cholesterol values and is compliant with medications prescribed. Participant is following exercise prescription and nutrition guidelines.;Long Term: Cholesterol controlled with medications as prescribed, with individualized exercise RX and with personalized nutrition plan. Value goals: LDL < 70mg , HDL > 40 mg.          Tobacco Use Initial Evaluation: Social History   Tobacco Use  Smoking Status Former   Current packs/day: 0.00   Average packs/day: 1 pack/day for 5.0 years (5.0 ttl pk-yrs)   Types: Cigarettes   Start date: 09/22/1984   Quit date: 09/22/1989   Years since quitting: 34.9  Smokeless Tobacco Former   Types: Snuff   Quit date: 09/22/1989  Tobacco Comments   dipped snuff for one year    Exercise Goals and Review:  Exercise Goals     Row Name 08/10/24 1313             Exercise Goals   Increase Physical Activity Yes       Intervention Develop an individualized exercise prescription for aerobic and resistive training based on initial evaluation findings, risk stratification, comorbidities and participant's personal goals.;Provide advice,  education, support and counseling about physical activity/exercise needs.       Expected Outcomes Short Term: Attend rehab on a regular basis to increase amount of physical activity.;Long Term: Exercising regularly at least 3-5 days a week.;Long Term: Add in home exercise to make exercise part of routine and to increase amount of physical activity.       Increase Strength and Stamina Yes       Intervention Provide  advice, education, support and counseling about physical activity/exercise needs.;Develop an individualized exercise prescription for aerobic and resistive training based on initial evaluation findings, risk stratification, comorbidities and participant's personal goals.       Expected Outcomes Short Term: Perform resistance training exercises routinely during rehab and add in resistance training at home;Short Term: Increase workloads from initial exercise prescription for resistance, speed, and METs.;Long Term: Improve cardiorespiratory fitness, muscular endurance and strength as measured by increased METs and functional capacity ( )       Able to understand and use rate of perceived exertion (RPE) scale Yes       Intervention Provide education and explanation on how to use RPE scale       Expected Outcomes Short Term: Able to use RPE daily in rehab to express subjective intensity level;Long Term:  Able to use RPE to guide intensity level when exercising independently       Knowledge and understanding of Target Heart Rate Range (THRR) Yes       Intervention Provide education and explanation of THRR including how the numbers were predicted and where they are located for reference       Expected Outcomes Short Term: Able to state/look up THRR;Long Term: Able to use THRR to govern intensity when exercising independently;Short Term: Able to use daily as guideline for intensity in rehab       Able to check pulse independently Yes       Intervention Provide education and demonstration on how to check pulse in carotid and radial arteries.;Review the importance of being able to check your own pulse for safety during independent exercise       Expected Outcomes Short Term: Able to explain why pulse checking is important during independent exercise;Long Term: Able to check pulse independently and accurately       Understanding of Exercise Prescription Yes       Intervention Provide education, explanation, and  written materials on patient's individual exercise prescription       Expected Outcomes Short Term: Able to explain program exercise prescription;Long Term: Able to explain home exercise prescription to exercise independently          Copy of goals given to participant.

## 2024-08-15 NOTE — Progress Notes (Signed)
 Patient attend orientation today.  Patient is attending [facility] Rehabilitation Program.  Documentation for diagnosis can be found in [facility].  Reviewed medical chart, RPE/RPD, gym safety, and program guidelines.  Patient was fitted to equipment they will be using during rehab.  Patient is scheduled to start exercise on 08/17/24.   Initial ITP created and sent for review and signature by Dr. Dorn Ross, Medical Director for Cardiac Rehabilitation Program.

## 2024-08-15 NOTE — Progress Notes (Signed)
 Cardiac Individual Treatment Plan  Patient Details  Name: Michael Vaughn MRN: [id number] Date of Birth: 15-Jul-1961 Referring Provider:   [name] from 08/15/2024 in [facility] CARDIAC REHABILITATION  Referring Provider Irven Alberta MD    Initial Encounter Date:  [name] from 08/15/2024 in [place] CARDIAC REHABILITATION  Date 08/15/24    Visit Diagnosis: NSTEMI (non-ST elevated myocardial infarction) [facility])  Patient's Home Medications on Admission:  Current Outpatient Medications:    allopurinol  (ZYLOPRIM ) 300 MG tablet, Take 300 mg by mouth daily., Disp: , Rfl:    aspirin  325 MG tablet, Take 325 mg by mouth daily., Disp: , Rfl:    finasteride (PROSCAR) 5 MG tablet, Take 5 mg by mouth daily., Disp: , Rfl:    fluticasone (FLONASE) 50 MCG/ACT nasal spray, Place 1 spray into both nostrils daily as needed for allergies., Disp: , Rfl:    glipiZIDE  (GLUCOTROL  XL) 10 MG 24 hr tablet, Take 10 mg by mouth daily.   (Patient not taking: Reported on 08/10/2024), Disp: , Rfl:    HYDROcodone -acetaminophen  (NORCO/VICODIN) 5-325 MG tablet, Take 1 tablet by mouth every 12 (twelve) hours as needed for moderate pain., Disp: , Rfl:    insulin  glargine (LANTUS) 100 UNIT/ML injection, Inject 24 Units into the skin daily., Disp: , Rfl:    magnesium oxide (MAG-OX) 400 MG tablet, Take 400 mg by mouth daily., Disp: , Rfl:    meclizine (ANTIVERT) 25 MG tablet, Take 1 tablet by mouth 3 (three) times daily as needed for dizziness., Disp: , Rfl:    metFORMIN  (GLUCOPHAGE ) 1000 MG tablet, Take 1,000 mg by mouth 2 (two) times daily with a meal., Disp: , Rfl:    metoprolol  succinate (TOPROL -XL) 50 MG 24 hr tablet, Take 50 mg by mouth daily., Disp: , Rfl:    Multiple Vitamin (MULTI-VITAMINS) TABS, Take 1 tablet daily by mouth., Disp: , Rfl:    nitroGLYCERIN  (NITROSTAT ) 0.4 MG SL tablet, Place 0.4 mg under the tongue. (Patient not taking:  Reported on 08/10/2024), Disp: , Rfl:    omega-3 acid ethyl esters (LOVAZA ) 1 g capsule, Take 1 g daily by mouth., Disp: , Rfl:    Polyethyl Glycol-Propyl Glycol (SYSTANE) 0.4-0.3 % SOLN, Place 1 application. into both eyes daily as needed (Dry eyes)., Disp: , Rfl:    rosuvastatin (CRESTOR) 40 MG tablet, Take 40 mg by mouth daily., Disp: , Rfl:    sacubitril-valsartan (ENTRESTO) 24-26 MG, Take 1 tablet by mouth 2 (two) times daily., Disp: , Rfl:    senna-docusate (SENOKOT-S) 8.6-50 MG tablet, Take 2 tablets by mouth 2 (two) times daily as needed for mild constipation or moderate constipation., Disp: , Rfl:    spironolactone  (ALDACTONE ) 25 MG tablet, Take 12.5 mg daily by mouth., Disp: , Rfl:    tadalafil (CIALIS) 5 MG tablet, Take 5 mg by mouth daily., Disp: , Rfl:    tamsulosin  (FLOMAX ) 0.4 MG CAPS capsule, Take 0.4 mg by mouth daily., Disp: , Rfl:    torsemide (DEMADEX) 20 MG tablet, Take 60 mg by mouth daily., Disp: , Rfl:   Past Medical History: Past Medical History:  Diagnosis Date   Anginal pain    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   Diabetes mellitus    Dyslipidemia    Dysrhythmia    Ejection fraction    Improved from the past  /  ejection fraction 50%, echo, July, 2012, hypokinesis at the base of the inferior wall.   GERD (gastroesophageal reflux disease)    Headache    History of hiatal hernia    Hypertension    Hypertension    Lumbar disc disease    Morbid obesity (HCC)    Myocardial infarction (HCC)    Nonischemic cardiomyopathy (HCC)    Catheterization 2000, normal coronaries, reduced ejection fraction  /  catheterization 2006 minimal scattered disease,  anomalous right coronary artery from left cusp   OSA (obstructive sleep apnea)    Shortness of breath    September, 2012    Tobacco Use: Social History   Tobacco Use  Smoking Status Former   Current packs/day: 0.00   Average packs/day: 1 pack/day for 5.0 years (5.0 ttl pk-yrs)   Types: Cigarettes   Start date: 09/22/1984   Quit date: 09/22/1989   Years since quitting: 34.9  Smokeless Tobacco Former   Types: Snuff   Quit date: 09/22/1989  Tobacco Comments   dipped snuff for one year    Labs: Review Flowsheet       01/14/2016 01/22/2016 08/04/2017 12/19/2017  Labs for ITP Cardiac and Pulmonary Rehab  Hemoglobin A1c 8.2  7.6  8.6  9.0%        Details       This result is from an external source.          Exercise Target Goals: Exercise Program Goal: Individual exercise prescription set using results from initial 6 min walk test and THRR while considering  patient's activity barriers and safety.   Exercise Prescription Goal: Initial exercise prescription builds to 30-45 minutes a day of aerobic activity, 2-3 days per week.  Home exercise guidelines will be given to patient during program as part of exercise prescription that the participant will acknowledge.   Education: Aerobic Exercise: - Group verbal and visual presentation on the components of exercise prescription. Introduces F.I.T.T principle from ACSM for exercise prescriptions.  Reviews F.I.T.T. principles of aerobic exercise including progression. Written material provided at class time.   Education: Resistance Exercise: - Group verbal and visual presentation on the components of exercise prescription. Introduces F.I.T.T principle from ACSM for exercise prescriptions  Reviews F.I.T.T. principles of resistance exercise including progression. Written material provided at class time.    Education: Exercise & Equipment Safety: - Individual verbal instruction and demonstration of equipment use and safety with use of  the equipment.   Education: Exercise Physiology & General Exercise Guidelines: - Group verbal and written instruction with models to review the exercise physiology of the cardiovascular system and associated critical values. Provides general exercise guidelines with specific guidelines to those with heart or lung disease. Written material provided at class time.   Education: Flexibility, Balance, Mind/Body Relaxation: - Group verbal and visual presentation with interactive activity on the components of exercise prescription. Introduces F.I.T.T principle from ACSM for exercise prescriptions. Reviews F.I.T.T. principles of flexibility and balance exercise training including progression. Also discusses the mind body connection.  Reviews various relaxation techniques to help reduce and manage stress (i.e. Deep breathing, progressive muscle relaxation, and visualization). Balance handout provided to take home. Written material provided at class time.   Activity Barriers & Risk Stratification:  Activity Barriers & Cardiac Risk Stratification - 08/10/24 1301       Activity Barriers & Cardiac Risk Stratification   Activity Barriers Muscular Weakness;Deconditioning;Chest Pain/Angina;Shortness of Breath    Cardiac Risk Stratification High          6 Minute Walk:  6 Minute Walk     Row Name 08/15/24 1457         6 Minute Walk   Phase Initial     Distance 1400 feet     Walk Time 6 minutes     # of Rest Breaks 0     MPH 2.65     METS 2.2     RPE 12     Perceived Dyspnea  0     VO2 Peak 11.6     Symptoms No     Resting HR 76 bpm     Resting BP 106/70     Resting Oxygen  Saturation  99 %     Exercise Oxygen  Saturation  during 6 min walk 99 %     Max Ex. HR 136 bpm     Max Ex. BP 128/60     2 Minute Post BP 110/64        Oxygen  Initial Assessment:   Oxygen  Re-Evaluation:   Oxygen  Discharge (Final Oxygen  Re-Evaluation):   Initial Exercise Prescription:  Initial Exercise  Prescription - 08/15/24 1400       Date of Initial Exercise RX and Referring Provider   Date 08/15/24    Referring Provider Irven Alberta MD      Treadmill   MPH 1.5    Grade 0    Minutes 15    METs 2.15      NuStep   Level 1    SPM 50    Minutes 15    METs 1.9      Prescription Details   Frequency (times per week) 3    Duration Progress to 30 minutes of continuous aerobic without signs/symptoms of physical distress      Intensity   THRR 40-80% of Max Heartrate 141-108    Ratings of Perceived Exertion 11-13    Perceived Dyspnea 0-4      Resistance Training   Training Prescription Yes    Weight 4.    Reps 10-15          Perform Capillary Blood Glucose checks as needed.  Exercise Prescription Changes:   Exercise Prescription Changes     Row Name 08/15/24 1500             Response to Exercise   Blood Pressure (Admit) 106/70       Blood Pressure (Exercise) 128/60       Blood Pressure (Exit) 110/64       Heart Rate (Admit) 76 bpm       Heart Rate (Exercise) 136 bpm       Heart Rate (Exit) 84 bpm       Oxygen  Saturation (Admit) 99 %       Oxygen  Saturation (Exercise) 99 %       Oxygen  Saturation (Exit) 99 %       Rating of Perceived Exertion (Exercise) 12       Perceived Dyspnea (Exercise) 0          Exercise Comments:   Exercise Comments     Row Name 08/10/24 1310           Exercise Comments Michael Vaughn states he is not doing much exercise right  now. He was an [facility] member at the [facility] recently before all the heart events.          Exercise Goals and Review:   Exercise Goals     Row Name 08/10/24 1313             Exercise Goals   Increase Physical Activity Yes       Intervention Develop an individualized exercise prescription for aerobic and resistive training based on initial evaluation findings, risk stratification, comorbidities and participant's personal goals.;Provide advice, education, support and counseling about physical  activity/exercise needs.       Expected Outcomes Short Term: Attend rehab on a regular basis to increase amount of physical activity.;Long Term: Exercising regularly at least 3-5 days a week.;Long Term: Add in home exercise to make exercise part of routine and to increase amount of physical activity.       Increase Strength and Stamina Yes       Intervention Provide advice, education, support and counseling about physical activity/exercise needs.;Develop an individualized exercise prescription for aerobic and resistive training based on initial evaluation findings, risk stratification, comorbidities and participant's personal goals.       Expected Outcomes Short Term: Perform resistance training exercises routinely during rehab and add in resistance training at home;Short Term: Increase workloads from initial exercise prescription for resistance, speed, and METs.;Long Term: Improve cardiorespiratory fitness, muscular endurance and strength as measured by increased METs and functional capacity ( )       Able to understand and use rate of perceived exertion (RPE) scale Yes       Intervention Provide education and explanation on how to use RPE scale       Expected Outcomes Short Term: Able to use RPE daily in rehab to express subjective intensity level;Long Term:  Able to use RPE to guide intensity level when exercising independently       Knowledge and understanding of Target Heart Rate Range (THRR) Yes       Intervention Provide education and explanation of THRR including how the numbers were predicted and where they are located for reference       Expected Outcomes Short Term: Able to state/look up THRR;Long Term: Able to use THRR to govern intensity when exercising independently;Short Term: Able to use daily as guideline for intensity in rehab       Able to check pulse independently Yes       Intervention Provide education and demonstration on how to check pulse in carotid and radial arteries.;Review  the importance of being able to check your own pulse for safety during independent exercise       Expected Outcomes Short Term: Able to explain why pulse checking is important during independent exercise;Long Term: Able to check pulse independently and accurately       Understanding of Exercise Prescription Yes       Intervention Provide education, explanation, and written materials on patient's individual exercise prescription       Expected Outcomes Short Term: Able to explain program exercise prescription;Long Term: Able to explain home exercise prescription to exercise independently          Exercise Goals Re-Evaluation :   Discharge Exercise Prescription (Final Exercise Prescription Changes):  Exercise Prescription Changes - 08/15/24 1500       Response to Exercise   Blood Pressure (Admit) 106/70    Blood Pressure (Exercise) 128/60    Blood Pressure (Exit) 110/64    Heart Rate (Admit) 76 bpm  Heart Rate (Exercise) 136 bpm    Heart Rate (Exit) 84 bpm    Oxygen  Saturation (Admit) 99 %    Oxygen  Saturation (Exercise) 99 %    Oxygen  Saturation (Exit) 99 %    Rating of Perceived Exertion (Exercise) 12    Perceived Dyspnea (Exercise) 0          Nutrition:  Target Goals: Understanding of nutrition guidelines, daily intake of sodium 1500mg , cholesterol 200mg , calories 30% from fat and 7% or less from saturated fats, daily to have 5 or more servings of fruits and vegetables.  Education: Nutrition 1 -Group instruction provided by verbal, written material, interactive activities, discussions, models, and posters to present general guidelines for heart healthy nutrition including macronutrients, label reading, and promoting whole foods over processed counterparts. Education serves as pensions consultant of discussion of heart healthy eating for all. Written material provided at class time.    Education: Nutrition 2 -Group instruction provided by verbal, written material, interactive  activities, discussions, models, and posters to present general guidelines for heart healthy nutrition including sodium, cholesterol, and saturated fat. Providing guidance of habit forming to improve blood pressure, cholesterol, and body weight. Written material provided at class time.     Biometrics:  Pre Biometrics - 08/15/24 1501       Pre Biometrics   Height 5' 10.5 (1.791 m)    Weight 114 kg    Waist Circumference 47 inches    Hip Circumference 44.5 inches    Waist to Hip Ratio 1.06 %    BMI (Calculated) 35.54    Grip Strength 25.1 kg    Single Leg Stand 30 seconds           Nutrition Therapy Plan and Nutrition Goals:  Nutrition Therapy & Goals - 08/10/24 1316       Intervention Plan   Intervention Prescribe, educate and counsel regarding individualized specific dietary modifications aiming towards targeted core components such as weight, hypertension, lipid management, diabetes, heart failure and other comorbidities.;Nutrition handout(s) given to patient.    Expected Outcomes Long Term Goal: Adherence to prescribed nutrition plan.;Short Term Goal: A plan has been developed with personal nutrition goals set during dietitian appointment.;Short Term Goal: Understand basic principles of dietary content, such as calories, fat, sodium, cholesterol and nutrients.          Nutrition Assessments:  Nutrition Assessments - 08/15/24 1434       MEDFICTS Scores   Pre Score 72         MEDIFICTS Score Key: >=70 Need to make dietary changes  40-70 Heart Healthy Diet <= 40 Therapeutic Level Cholesterol Diet  [name] from 08/15/2024 in [facility] CARDIAC REHABILITATION  Picture Your Plate Total Score on Admission 72   Picture Your Plate Scores: <59 Unhealthy dietary pattern with much room for improvement. 41-50 Dietary pattern unlikely to meet recommendations for good health and room for improvement. 51-60 More healthful dietary  pattern, with some room for improvement.  >60 Healthy dietary pattern, although there may be some specific behaviors that could be improved.    Nutrition Goals Re-Evaluation:   Nutrition Goals Discharge (Final Nutrition Goals Re-Evaluation):   Psychosocial: Target Goals: Acknowledge presence or absence of significant depression and/or stress, maximize coping skills, provide positive support system. Participant is able to verbalize types and ability to use techniques and skills needed for reducing stress and depression.   Education: Stress, Anxiety, and Depression - Group verbal and visual presentation to define topics  covered.  Reviews how body is impacted by stress, anxiety, and depression.  Also discusses healthy ways to reduce stress and to treat/manage anxiety and depression. Written material provided at class time.   Education: Sleep Hygiene -Provides group verbal and written instruction about how sleep can affect your health.  Define sleep hygiene, discuss sleep cycles and impact of sleep habits. Review good sleep hygiene tips.   Initial Review & Psychosocial Screening:  Initial Psych Review & Screening - 08/10/24 1316       Initial Review   Current issues with None Identified      Family Dynamics   Good Support System? Yes    Comments His support system is his sister and his 2 grandsons.      Barriers   Psychosocial barriers to participate in program There are no identifiable barriers or psychosocial needs.      Screening Interventions   Interventions Encouraged to exercise    Expected Outcomes Long Term goal: The participant improves quality of Life and PHQ9 Scores as seen by post scores and/or verbalization of changes;Long Term Goal: Stressors or current issues are controlled or eliminated.;Short Term goal: Utilizing psychosocial counselor, staff and physician to assist with identification of specific Stressors or current issues interfering with healing process. Setting  desired goal for each stressor or current issue identified.;Short Term goal: Identification and review with participant of any Quality of Life or Depression concerns found by scoring the questionnaire.          Quality of Life Scores:   Quality of Life - 08/15/24 1504       Quality of Life   Select Quality of Life      Quality of Life Scores   Health/Function Pre 22.71 %    Socioeconomic Pre 25.71 %    Psych/Spiritual Pre 27.43 %    Family Pre 30 %    GLOBAL Pre 25.31 %         Scores of 19 and below usually indicate a poorer quality of life in these areas.  A difference of  2-3 points is a clinically meaningful difference.  A difference of 2-3 points in the total score of the Quality of Life Index has been associated with significant improvement in overall quality of life, self-image, physical symptoms, and general health in studies assessing change in quality of life.  PHQ-9: Review Flowsheet       08/15/2024 02/13/2022 08/06/2018  Depression screen PHQ 2/9  Decreased Interest 0 0 0  Down, Depressed, Hopeless 0 0 0  PHQ - 2 Score 0 0 0  Altered sleeping 0 1 1  Tired, decreased energy 1 3 1   Change in appetite 0 0 0  Feeling bad or failure about yourself  0 0 0  Trouble concentrating 0 0 0  Moving slowly or fidgety/restless 0 0 0  Suicidal thoughts 0 0 -  PHQ-9 Score 1 4  2    Difficult doing work/chores Somewhat difficult Somewhat difficult -    Details       Data saved with a previous flowsheet row definition        Interpretation of Total Score  Total Score Depression Severity:  1-4 = Minimal depression, 5-9 = Mild depression, 10-14 = Moderate depression, 15-19 = Moderately severe depression, 20-27 = Severe depression   Psychosocial Evaluation and Intervention:  Psychosocial Evaluation - 08/10/24 1317       Psychosocial Evaluation & Interventions   Interventions Encouraged to exercise with the program and follow  exercise prescription    Comments Michael Vaughn  is a pleasant 63 year old male who is coming into cardiac rehab with a NSTEMI diagnosis. He is currently not active with home exercise, but is ready to get back into it and feel better. He has his sister as a support system, and his two grandsons who are senior's in high school who stay with him most of the time and provide him company. His mobility is good, he states his left knee does bother him some from time to time. He is disabled, lives home alone, and is seperated from his wife. He does have an ICD in place. He is eager to start the program.    Expected Outcomes Short: Increase strength and stamina. Long: Improve SOB.    Continue Psychosocial Services  Follow up required by staff          Psychosocial Re-Evaluation:   Psychosocial Discharge (Final Psychosocial Re-Evaluation):   Vocational Rehabilitation: Provide vocational rehab assistance to qualifying candidates.   Vocational Rehab Evaluation & Intervention:  Vocational Rehab - 08/10/24 1316       Initial Vocational Rehab Evaluation & Intervention   Assessment shows need for Vocational Rehabilitation No      Vocational Rehab Re-Evaulation   Comments He is disabled and not going back to work.          Education: Education Goals: Education classes will be provided on a variety of topics geared toward better understanding of heart health and risk factor modification. Participant will state understanding/return demonstration of topics presented as noted by education test scores.  Learning Barriers/Preferences:  Learning Barriers/Preferences - 08/10/24 1316       Learning Barriers/Preferences   Learning Barriers None    Learning Preferences Group Instruction;Individual Instruction;Skilled Demonstration;Written Material          General Cardiac Education Topics:  AED/CPR: - Group verbal and written instruction with the use of models to demonstrate the basic use of the AED with the basic ABC's of  resuscitation.   Test and Procedures: - Group verbal and visual presentation and models provide information about basic cardiac anatomy and function. Reviews the testing methods done to diagnose heart disease and the outcomes of the test results. Describes the treatment choices: Medical Management, Angioplasty, or Coronary Bypass Surgery for treating various heart conditions including Myocardial Infarction, Angina, Valve Disease, and Cardiac Arrhythmias. Written material provided at class time.   Medication Safety: - Group verbal and visual instruction to review commonly prescribed medications for heart and lung disease. Reviews the medication, class of the drug, and side effects. Includes the steps to properly store meds and maintain the prescription regimen. Written material provided at class time.   Intimacy: - Group verbal instruction through game format to discuss how heart and lung disease can affect sexual intimacy. Written material provided at class time.   Know Your Numbers and Heart Failure: - Group verbal and visual instruction to discuss disease risk factors for cardiac and pulmonary disease and treatment options.  Reviews associated critical values for Overweight/Obesity, Hypertension, Cholesterol, and Diabetes.  Discusses basics of heart failure: signs/symptoms and treatments.  Introduces Heart Failure Zone chart for action plan for heart failure. Written material provided at class time.   Infection Prevention: - Provides verbal and written material to individual with discussion of infection control including proper hand washing and proper equipment cleaning during exercise session.   Falls Prevention: - Provides verbal and written material to individual with discussion of falls prevention and safety.  Other: -Provides group and verbal instruction on various topics (see comments)   Knowledge Questionnaire Score:  Knowledge Questionnaire Score - 08/15/24 1431        Knowledge Questionnaire Score   Pre Score 24/26          Core Components/Risk Factors/Patient Goals at Admission:  Personal Goals and Risk Factors at Admission - 08/10/24 1314       Core Components/Risk Factors/Patient Goals on Admission    Weight Management Yes;Weight Maintenance    Intervention Weight Management: Develop a combined nutrition and exercise program designed to reach desired caloric intake, while maintaining appropriate intake of nutrient and fiber, sodium and fats, and appropriate energy expenditure required for the weight goal.;Weight Management: Provide education and appropriate resources to help participant work on and attain dietary goals.;Weight Management/Obesity: Establish reasonable short term and long term weight goals.    Expected Outcomes Short Term: Continue to assess and modify interventions until short term weight is achieved;Long Term: Adherence to nutrition and physical activity/exercise program aimed toward attainment of established weight goal;Weight Maintenance: Understanding of the daily nutrition guidelines, which includes 25-35% calories from fat, 7% or less cal from saturated fats, less than 200mg  cholesterol, less than 1.5gm of sodium, & 5 or more servings of fruits and vegetables daily;Weight Loss: Understanding of general recommendations for a balanced deficit meal plan, which promotes 1-2 lb weight loss per week and includes a negative energy balance of [phone] kcal/d;Understanding recommendations for meals to include 15-35% energy as protein, 25-35% energy from fat, 35-60% energy from carbohydrates, less than 200mg  of dietary cholesterol, 20-35 gm of total fiber daily;Understanding of distribution of calorie intake throughout the day with the consumption of 4-5 meals/snacks    Improve shortness of breath with ADL's Yes    Intervention Provide education, individualized exercise plan and daily activity instruction to help decrease symptoms of SOB with  activities of daily living.    Expected Outcomes Short Term: Improve cardiorespiratory fitness to achieve a reduction of symptoms when performing ADLs;Long Term: Be able to perform more ADLs without symptoms or delay the onset of symptoms    Diabetes Yes    Intervention Provide education about signs/symptoms and action to take for hypo/hyperglycemia.;Provide education about proper nutrition, including hydration, and aerobic/resistive exercise prescription along with prescribed medications to achieve blood glucose in normal ranges: Fasting glucose 65-99 mg/dL    Expected Outcomes Short Term: Participant verbalizes understanding of the signs/symptoms and immediate care of hyper/hypoglycemia, proper foot care and importance of medication, aerobic/resistive exercise and nutrition plan for blood glucose control.;Long Term: Attainment of HbA1C < 7%.    Heart Failure Yes    Intervention Provide a combined exercise and nutrition program that is supplemented with education, support and counseling about heart failure. Directed toward relieving symptoms such as shortness of breath, decreased exercise tolerance, and extremity edema.    Expected Outcomes Improve functional capacity of life;Short term: Attendance in program 2-3 days a week with increased exercise capacity. Reported lower sodium intake. Reported increased fruit and vegetable intake. Reports medication compliance.;Short term: Daily weights obtained and reported for increase. Utilizing diuretic protocols set by physician.;Long term: Adoption of self-care skills and reduction of barriers for early signs and symptoms recognition and intervention leading to self-care maintenance.    Hypertension Yes    Intervention Provide education on lifestyle modifcations including regular physical activity/exercise, weight management, moderate sodium restriction and increased consumption of fresh fruit, vegetables, and low fat dairy, alcohol moderation, and smoking  cessation.;Monitor prescription use compliance.  Expected Outcomes Short Term: Continued assessment and intervention until BP is < 140/19mm HG in hypertensive participants. < 130/75mm HG in hypertensive participants with diabetes, heart failure or chronic kidney disease.;Long Term: Maintenance of blood pressure at goal levels.    Lipids Yes    Intervention Provide education and support for participant on nutrition & aerobic/resistive exercise along with prescribed medications to achieve LDL 70mg , HDL >40mg .    Expected Outcomes Short Term: Participant states understanding of desired cholesterol values and is compliant with medications prescribed. Participant is following exercise prescription and nutrition guidelines.;Long Term: Cholesterol controlled with medications as prescribed, with individualized exercise RX and with personalized nutrition plan. Value goals: LDL < 70mg , HDL > 40 mg.          Education:Diabetes - Individual verbal and written instruction to review signs/symptoms of diabetes, desired ranges of glucose level fasting, after meals and with exercise. Acknowledge that pre and post exercise glucose checks will be done for 3 sessions at entry of program.   Core Components/Risk Factors/Patient Goals Review:    Core Components/Risk Factors/Patient Goals at Discharge (Final Review):    ITP Comments:  ITP Comments     Row Name 08/10/24 1309           ITP Comments Completed virtual orientation today.  EP evaluation is scheduled for 08/15/24 at 2:15 pm.  Documentation for diagnosis can be found in [facility] encounter.          Comments: Initial ITP.

## 2024-08-17 ENCOUNTER — Encounter (HOSPITAL_COMMUNITY)
Admission: RE | Admit: 2024-08-17 | Discharge: 2024-08-17 | Disposition: A | Discharge status: Home / Self Care | Source: Ambulatory Visit | Attending: Internal Medicine | Admitting: Internal Medicine

## 2024-08-17 DIAGNOSIS — I214 Non-ST elevation (NSTEMI) myocardial infarction: Secondary | ICD-10-CM | POA: Diagnosis not present

## 2024-08-17 LAB — GLUCOSE, CAPILLARY
Glucose-Capillary: 105 mg/dL — ABNORMAL HIGH (ref 70–99)
Glucose-Capillary: 107 mg/dL — ABNORMAL HIGH (ref 70–99)
Glucose-Capillary: 139 mg/dL — ABNORMAL HIGH (ref 70–99)

## 2024-08-17 NOTE — Progress Notes (Signed)
 Daily Session Note  Patient Details  Name: DYON ROTERT MRN: [id number] Date of Birth: [birth year] Referring Provider:   [name] from 08/15/2024 in [facility] CARDIAC REHABIL[facility]ATION  Referring Provider Irven Alberta MD    Encounter Date: 08/17/2024  Check In:  Session Check In - 08/17/24 9076       Check-In   Supervising physician immediately available to respond to emergencies See telemetry face sheet for immediately available MD    Location [facility]    Staff Present Laymon Rattler, BSN, RN, WTA-C;Heather Con HECKLE, Exercise Physiologist;Hillary Troutman BSN, RN    Virtual Visit No    Medication changes reported     No    Fall or balance concerns reported    No    Tobacco Cessation No Change    Warm-up and Cool-down Performed on first and last piece of equipment    Resistance Training Performed Yes    VAD Patient? No    PAD/SET Patient? No      Pain Assessment   Currently in Pain? No/denies          Capillary Blood Glucose: No results found for this or any previous visit (from the past 24 hours).    Social History   Tobacco Use  Smoking Status Former   Current packs/day: 0.00   Average packs/day: 1 pack/day for 5.0 years (5.0 ttl pk-yrs)   Types: Cigarettes   Start date: 09/22/1984   Quit date: 09/22/1989   Years since quitting: 34.9  Smokeless Tobacco Former   Types: Snuff   Quit date: 09/22/1989  Tobacco Comments   dipped snuff for one year    Goals Met:  Independence with exercise equipment Exercise tolerated well No report of concerns or symptoms today Strength training completed today  Goals Unmet:  Not Applicable  Comments: First full day of exercise!  Patient was oriented to gym and equipment including functions, settings, policies, and procedures.  Patient's individual exercise prescription and treatment plan were reviewed.  All starting workloads were established based on the results  of the 6 minute walk test done at initial orientation visit.  The plan for exercise progression was also introduced and progression will be customized based on patient's performance and goals.

## 2024-08-22 ENCOUNTER — Encounter (HOSPITAL_COMMUNITY)
Admission: RE | Admit: 2024-08-22 | Discharge: 2024-08-22 | Disposition: A | Source: Ambulatory Visit | Attending: Internal Medicine

## 2024-08-22 DIAGNOSIS — I214 Non-ST elevation (NSTEMI) myocardial infarction: Secondary | ICD-10-CM | POA: Diagnosis present

## 2024-08-22 LAB — GLUCOSE, CAPILLARY
Glucose-Capillary: 85 mg/dL (ref 70–99)
Glucose-Capillary: 134 mg/dL — ABNORMAL HIGH (ref 70–99)
Glucose-Capillary: 97 mg/dL (ref 70–99)

## 2024-08-22 NOTE — Progress Notes (Signed)
 Daily Session Note  Patient Details  Name: Michael Vaughn MRN: [id number] Date of Birth: [birth year] Referring Provider:   [name] from 08/15/2024 in [facility] CARDIAC REHABILITATION  Referring Provider Irven Alberta MD    Encounter Date: 08/22/2024  Check In:  Session Check In - 08/22/24 0915       Check-In   Supervising physician immediately available to respond to emergencies See telemetry face sheet for immediately available MD    Location [facility]    Staff Present Richerd Buddle, RN;Armonte Tortorella Vicci, RN, BSN;Heather Con, BS, Exercise Physiologist    Virtual Visit No    Medication changes reported     No    Fall or balance concerns reported    No    Warm-up and Cool-down Performed on first and last piece of equipment    Resistance Training Performed Yes    VAD Patient? No    PAD/SET Patient? No      Pain Assessment   Currently in Pain? No/denies    Multiple Pain Sites No          Capillary Blood Glucose: Results for orders placed or performed during the hospital encounter of 08/22/24 (from the past 24 hours)  Glucose, capillary     Status: None   Collection Time: 08/22/24  9:44 AM  Result Value Ref Range   Glucose-Capillary 97 70 - 99 mg/dL      Social History   Tobacco Use  Smoking Status Former   Current packs/day: 0.00   Average packs/day: 1 pack/day for 5.0 years (5.0 ttl pk-yrs)   Types: Cigarettes   Start date: 09/22/1984   Quit date: 09/22/1989   Years since quitting: 34.9  Smokeless Tobacco Former   Types: Snuff   Quit date: 09/22/1989  Tobacco Comments   dipped snuff for one year    Goals Met:  Independence with exercise equipment Exercise tolerated well No report of concerns or symptoms today Strength training completed today  Goals Unmet:  Not Applicable  Comments: Pt able to follow exercise prescription today without complaint.  Will continue to monitor for  progression.

## 2024-08-23 ENCOUNTER — Encounter (HOSPITAL_COMMUNITY): Payer: Self-pay | Admitting: *Deleted

## 2024-08-23 DIAGNOSIS — I214 Non-ST elevation (NSTEMI) myocardial infarction: Secondary | ICD-10-CM

## 2024-08-23 NOTE — Progress Notes (Signed)
 Cardiac Individual Treatment Plan  Patient Details  Name: Michael Vaughn MRN: [id number] Date of Birth: [birth year] Referring Provider:   [name] from 08/15/2024 in [facility] CARDIAC REHABILITATION  Referring Provider Irven Alberta MD    Initial Encounter Date:  [name] from 08/15/2024 in [place] [facility] CARDIAC REH[facility]ILITATION  Date 08/15/24    Visit Diagnosis: NSTEMI (non-ST elevated myocardial infarction) [facility])  Patient's Home Medications on Admission:  Current Outpatient Medications:    allopurinol  (ZYLOPRIM ) 300 MG tablet, Take 300 mg by mouth daily., Disp: , Rfl:    aspirin  325 MG tablet, Take 325 mg by mouth daily., Disp: , Rfl:    finasteride (PROSCAR) 5 MG tablet, Take 5 mg by mouth daily., Disp: , Rfl:    fluticasone (FLONASE) 50 MCG/ACT nasal spray, Place 1 spray into both nostrils daily as needed for allergies., Disp: , Rfl:    glipiZIDE  (GLUCOTROL  XL) 10 MG 24 hr tablet, Take 10 mg by mouth daily.   (Patient not taking: Reported on 08/10/2024), Disp: , Rfl:    HYDROcodone -acetaminophen  (NORCO/VICODIN) 5-325 MG tablet, Take 1 tablet by mouth every 12 (twelve) hours as needed for moderate pain., Disp: , Rfl:    insulin  glargine (LANTUS) 100 UNIT/ML injection, Inject 24 Units into the skin daily., Disp: , Rfl:    magnesium oxide (MAG-OX) 400 MG tablet, Take 400 mg by mouth daily., Disp: , Rfl:    meclizine (ANTIVERT) 25 MG tablet, Take 1 tablet by mouth 3 (three) times daily as needed for dizziness., Disp: , Rfl:    metFORMIN  (GLUCOPHAGE ) 1000 MG tablet, Take 1,000 mg by mouth 2 (two) times daily with a meal., Disp: , Rfl:    metoprolol  succinate (TOPROL -XL) 50 MG 24 hr tablet, Take 50 mg by mouth daily., Disp: , Rfl:    Multiple Vitamin (MULTI-VITAMINS) TABS, Take 1 tablet daily by mouth., Disp: , Rfl:    nitroGLYCERIN  (NITROSTAT ) 0.4 MG SL tablet, Place 0.4 mg under the tongue. (Patient not taking:  Reported on 08/10/2024), Disp: , Rfl:    omega-3 acid ethyl esters (LOVAZA ) 1 g capsule, Take 1 g daily by mouth., Disp: , Rfl:    Polyethyl Glycol-Propyl Glycol (SYSTANE) 0.4-0.3 % SOLN, Place 1 application. into both eyes daily as needed (Dry eyes)., Disp: , Rfl:    rosuvastatin (CRESTOR) 40 MG tablet, Take 40 mg by mouth daily., Disp: , Rfl:    sacubitril-valsartan (ENTRESTO) 24-26 MG, Take 1 tablet by mouth 2 (two) times daily., Disp: , Rfl:    senna-docusate (SENOKOT-S) 8.6-50 MG tablet, Take 2 tablets by mouth 2 (two) times daily as needed for mild constipation or moderate constipation., Disp: , Rfl:    spironolactone  (ALDACTONE ) 25 MG tablet, Take 12.5 mg daily by mouth., Disp: , Rfl:    tadalafil (CIALIS) 5 MG tablet, Take 5 mg by mouth daily., Disp: , Rfl:    tamsulosin  (FLOMAX ) 0.4 MG CAPS capsule, Take 0.4 mg by mouth daily., Disp: , Rfl:    torsemide (DEMADEX) 20 MG tablet, Take 60 mg by mouth daily., Disp: , Rfl:   Past Medical History: Past Medical History:  Diagnosis Date   Anginal pain    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   Diabetes mellitus    Dyslipidemia    Dysrhythmia    Ejection fraction    Improved from the past  /  ejection fraction 50%, echo, July, 2012, hypokinesis at the base of the inferior wall.   GERD (gastroesophageal reflux disease)    Headache    History of hiatal hernia    Hypertension    Hypertension    Lumbar disc disease    Morbid obesity (HCC)    Myocardial infarction (HCC)    Nonischemic cardiomyopathy (HCC)    Catheterization 2000, normal coronaries, reduced ejection fraction  /  catheterization 2006 minimal scattered disease,  anomalous right coronary artery from left cusp   OSA (obstructive sleep apnea)    Shortness of breath    September, 2012    Tobacco Use: Social History   Tobacco Use  Smoking Status Former   Current packs/day: 0.00   Average packs/day: 1 pack/day for 5.0 years (5.0 ttl pk-yrs)   Types: Cigarettes   Start date: 09/22/1984   Quit date: 09/22/1989   Years since quitting: 34.9  Smokeless Tobacco Former   Types: Snuff   Quit date: 09/22/1989  Tobacco Comments   dipped snuff for one year    Labs: Review Flowsheet       01/14/2016 01/22/2016 08/04/2017 12/19/2017  Labs for ITP Cardiac and Pulmonary Rehab  Hemoglobin A1c 8.2  7.6  8.6  9.0%        Details       This result is from an external source.         Capillary Blood Glucose: Lab Results  Component Value Date   GLUCAP 134 (H) 08/22/2024   GLUCAP 97 08/22/2024   GLUCAP 85 08/22/2024   GLUCAP 139 (H) 08/17/2024   GLUCAP 107 (H) 08/17/2024     Exercise Target Goals: Exercise Program Goal: Individual exercise prescription set using results from initial 6 min walk test and THRR while considering  patient's activity barriers and safety.   Exercise Prescription Goal: Starting with aerobic activity 30 plus minutes a day, 3 days per week for initial exercise prescription. Provide home exercise prescription and guidelines that participant acknowledges understanding prior to discharge.  Activity Barriers & Risk Stratification:  Activity Barriers & Cardiac Risk Stratification - 08/10/24 1301       Activity Barriers & Cardiac Risk Stratification   Activity Barriers Muscular Weakness;Deconditioning;Chest Pain/Angina;Shortness of Breath    Cardiac Risk Stratification High          6 Minute Walk:  6 Minute Walk     Row Name 08/15/24 1457         6 Minute Walk   Phase Initial     Distance 1400 feet     Walk Time 6 minutes     # of Rest Breaks 0     MPH 2.65     METS 2.2     RPE 12     Perceived Dyspnea  0      VO2 Peak 11.6     Symptoms No     Resting HR 76 bpm     Resting BP 106/70     Resting Oxygen  Saturation  99 %     Exercise Oxygen  Saturation  during 6 min walk 99 %     Max Ex. HR 136 bpm     Max Ex. BP 128/60  2 Minute Post BP 110/64        Oxygen  Initial Assessment:   Oxygen  Re-Evaluation:   Oxygen  Discharge (Final Oxygen  Re-Evaluation):   Initial Exercise Prescription:  Initial Exercise Prescription - 08/15/24 1400       Date of Initial Exercise RX and Referring Provider   Date 08/15/24    Referring Provider Irven Alberta MD      Treadmill   MPH 1.5    Grade 0    Minutes 15    METs 2.15      NuStep   Level 1    SPM 50    Minutes 15    METs 1.9      Prescription Details   Frequency (times per week) 3    Duration Progress to 30 minutes of continuous aerobic without signs/symptoms of physical distress      Intensity   THRR 40-80% of Max Heartrate 141-108    Ratings of Perceived Exertion 11-13    Perceived Dyspnea 0-4      Resistance Training   Training Prescription Yes    Weight 4.    Reps 10-15          Perform Capillary Blood Glucose checks as needed.  Exercise Prescription Changes:   Exercise Prescription Changes     Row Name 08/15/24 1500             Response to Exercise   Blood Pressure (Admit) 106/70       Blood Pressure (Exercise) 128/60       Blood Pressure (Exit) 110/64       Heart Rate (Admit) 76 bpm       Heart Rate (Exercise) 136 bpm       Heart Rate (Exit) 84 bpm       Oxygen  Saturation (Admit) 99 %       Oxygen  Saturation (Exercise) 99 %       Oxygen  Saturation (Exit) 99 %       Rating of Perceived Exertion (Exercise) 12       Perceived Dyspnea (Exercise) 0          Exercise Comments:   Exercise Comments     Row Name 08/10/24 1310 08/15/24 1508 08/17/24 0924       Exercise Comments Wade states he is not doing much exercise right now. He was an active member at the [facility] recently before all the heart  events. Patient attend orientation today.  Patient is attending Cardiac Rehabilitation Program.  Documentation for diagnosis can be found in [facility].  Reviewed medical chart, RPE/RPD, gym safety, and program guidelines.  Patient was fitted to equipment they will be using during rehab.  Patient is scheduled to start exercise on 08/17/24.   Initial ITP created and sent for review and signature by Dr. Dorn Ross, Medical Director for Cardiac Rehabilitation Program. First full day of exercise!  Patient was oriented to gym and equipment including functions, settings, policies, and procedures.  Patient's individual exercise prescription and treatment plan were reviewed.  All starting workloads were established based on the results of the 6 minute walk test done at initial orientation visit.  The plan for exercise progression was also introduced and progression will be customized based on patient's performance and goals.        Exercise Goals and Review:   Exercise Goals     Row Name 08/10/24 1313             Exercise Goals   Increase Physical Activity  Yes       Intervention Develop an individualized exercise prescription for aerobic and resistive training based on initial evaluation findings, risk stratification, comorbidities and participant's personal goals.;Provide advice, education, support and counseling about physical activity/exercise needs.       Expected Outcomes Short Term: Attend rehab on a regular basis to increase amount of physical activity.;Long Term: Exercising regularly at least 3-5 days a week.;Long Term: Add in home exercise to make exercise part of routine and to increase amount of physical activity.       Increase Strength and Stamina Yes       Intervention Provide advice, education, support and counseling about physical activity/exercise needs.;Develop an individualized exercise prescription for aerobic and resistive training based on initial evaluation findings, risk  stratification, comorbidities and participant's personal goals.       Expected Outcomes Short Term: Perform resistance training exercises routinely during rehab and add in resistance training at home;Short Term: Increase workloads from initial exercise prescription for resistance, speed, and METs.;Long Term: Improve cardiorespiratory fitness, muscular endurance and strength as measured by increased METs and functional capacity ( )       Able to understand and use rate of perceived exertion (RPE) scale Yes       Intervention Provide education and explanation on how to use RPE scale       Expected Outcomes Short Term: Able to use RPE daily in rehab to express subjective intensity level;Long Term:  Able to use RPE to guide intensity level when exercising independently       Knowledge and understanding of Target Heart Rate Range (THRR) Yes       Intervention Provide education and explanation of THRR including how the numbers were predicted and where they are located for reference       Expected Outcomes Short Term: Able to state/look up THRR;Long Term: Able to use THRR to govern intensity when exercising independently;Short Term: Able to use daily as guideline for intensity in rehab       Able to check pulse independently Yes       Intervention Provide education and demonstration on how to check pulse in carotid and radial arteries.;Review the importance of being able to check your own pulse for safety during independent exercise       Expected Outcomes Short Term: Able to explain why pulse checking is important during independent exercise;Long Term: Able to check pulse independently and accurately       Understanding of Exercise Prescription Yes       Intervention Provide education, explanation, and written materials on patient's individual exercise prescription       Expected Outcomes Short Term: Able to explain program exercise prescription;Long Term: Able to explain home exercise prescription to  exercise independently          Exercise Goals Re-Evaluation :  Exercise Goals Re-Evaluation     Row Name 08/17/24 0924             Exercise Goal Re-Evaluation   Exercise Goals Review Increase Physical Activity;Increase Strength and Stamina;Able to understand and use Dyspnea scale;Able to understand and use rate of perceived exertion (RPE) scale;Knowledge and understanding of Target Heart Rate Range (THRR);Able to check pulse independently;Understanding of Exercise Prescription       Comments Reviewed RPE and dyspnea scale, THR and program prescription with pt today.  Pt voiced understanding and was given a copy of goals to take home.       Expected Outcomes Short: Use RPE daily  to regulate intensity.  Long: Follow program prescription in THR.           Discharge Exercise Prescription (Final Exercise Prescription Changes):  Exercise Prescription Changes - 08/15/24 1500       Response to Exercise   Blood Pressure (Admit) 106/70    Blood Pressure (Exercise) 128/60    Blood Pressure (Exit) 110/64    Heart Rate (Admit) 76 bpm    Heart Rate (Exercise) 136 bpm    Heart Rate (Exit) 84 bpm    Oxygen  Saturation (Admit) 99 %    Oxygen  Saturation (Exercise) 99 %    Oxygen  Saturation (Exit) 99 %    Rating of Perceived Exertion (Exercise) 12    Perceived Dyspnea (Exercise) 0          Nutrition:  Target Goals: Understanding of nutrition guidelines, daily intake of sodium 1500mg , cholesterol 200mg , calories 30% from fat and 7% or less from saturated fats, daily to have 5 or more servings of fruits and vegetables.  Biometrics:  Pre Biometrics - 08/15/24 1501       Pre Biometrics   Height 5' 10.5 (1.791 m)    Weight 251 lb 5.2 oz (114 kg)    Waist Circumference 47 inches    Hip Circumference 44.5 inches    Waist to Hip Ratio 1.06 %    BMI (Calculated) 35.54    Grip Strength 25.1 kg    Single Leg Stand 30 seconds           Nutrition Therapy Plan and Nutrition Goals:   Nutrition Therapy & Goals - 08/10/24 1316       Intervention Plan   Intervention Prescribe, educate and counsel regarding individualized specific dietary modifications aiming towards targeted core components such as weight, hypertension, lipid management, diabetes, heart failure and other comorbidities.;Nutrition handout(s) given to patient.    Expected Outcomes Long Term Goal: Adherence to prescribed nutrition plan.;Short Term Goal: A plan has been developed with personal nutrition goals set during dietitian appointment.;Short Term Goal: Understand basic principles of dietary content, such as calories, fat, sodium, cholesterol and nutrients.          Nutrition Assessments:  Nutrition Assessments - 08/15/24 1434       MEDFICTS Scores   Pre Score 72         MEDIFICTS Score Key: >=70 Need to make dietary changes  40-70 Heart Healthy Diet <= 40 Therapeutic Level Cholesterol Diet  [name] from 08/15/2024 in [facility] CARDIAC REHABILITATION  Picture Your Plate Total Score on Admission 72   Picture Your Plate Scores: <59 Unhealthy dietary pattern with much room for improvement. 41-50 Dietary pattern unlikely to meet recommendations for good health and room for improvement. 51-60 More healthful dietary pattern, with some room for improvement.  >60 Healthy dietary pattern, although there may be some specific behaviors that could be improved.    Nutrition Goals Re-Evaluation:   Nutrition Goals Discharge (Final Nutrition Goals Re-Evaluation):   Psychosocial: Target Goals: Acknowledge presence or absence of significant depression and/or stress, maximize coping skills, provide positive support system. Participant is able to verbalize types and ability to use techniques and skills needed for reducing stress and depression.  Initial Review & Psychosocial Screening:  Initial Psych Review & Screening - 08/10/24 1316       Initial Review    Current issues with None Identified      Family Dynamics   Good Support System? Yes    Comments  His support system is his sister and his 2 grandsons.      Barriers   Psychosocial barriers to participate in program There are no identifiable barriers or psychosocial needs.      Screening Interventions   Interventions Encouraged to exercise    Expected Outcomes Long Term goal: The participant improves quality of Life and PHQ9 Scores as seen by post scores and/or verbalization of changes;Long Term Goal: Stressors or current issues are controlled or eliminated.;Short Term goal: Utilizing psychosocial counselor, staff and physician to assist with identification of specific Stressors or current issues interfering with healing process. Setting desired goal for each stressor or current issue identified.;Short Term goal: Identification and review with participant of any Quality of Life or Depression concerns found by scoring the questionnaire.          Quality of Life Scores:  Quality of Life - 08/15/24 1504       Quality of Life   Select Quality of Life      Quality of Life Scores   Health/Function Pre 22.71 %    Socioeconomic Pre 25.71 %    Psych/Spiritual Pre 27.43 %    Family Pre 30 %    GLOBAL Pre 25.31 %         Scores of 19 and below usually indicate a poorer quality of life in these areas.  A difference of  2-3 points is a clinically meaningful difference.  A difference of 2-3 points in the total score of the Quality of Life Index has been associated with significant improvement in overall quality of life, self-image, physical symptoms, and general health in studies assessing change in quality of life.  PHQ-9: Review Flowsheet       08/15/2024 02/13/2022 08/06/2018  Depression screen PHQ 2/9  Decreased Interest 0 0 0  Down, Depressed, Hopeless 0 0 0  PHQ - 2 Score 0 0 0  Altered sleeping 0 1 1  Tired, decreased energy 1 3 1   Change in appetite 0 0 0  Feeling bad or  failure about yourself  0 0 0  Trouble concentrating 0 0 0  Moving slowly or fidgety/restless 0 0 0  Suicidal thoughts 0 0 -  PHQ-9 Score 1 4  2    Difficult doing work/chores Somewhat difficult Somewhat difficult -    Details       Data saved with a previous flowsheet row definition        Interpretation of Total Score  Total Score Depression Severity:  1-4 = Minimal depression, 5-9 = Mild depression, 10-14 = Moderate depression, 15-19 = Moderately severe depression, 20-27 = Severe depression   Psychosocial Evaluation and Intervention:  Psychosocial Evaluation - 08/10/24 1317       Psychosocial Evaluation & Interventions   Interventions Encouraged to exercise with the program and follow exercise prescription    Comments Arvid is a pleasant 63 year old male who is coming into cardiac rehab with a NSTEMI diagnosis. He is currently not active with home exercise, but is ready to get back into it and feel better. He has his sister as a support system, and his two grandsons who are senior's in high school who stay with him most of the time and provide him company. His mobility is good, he states his left knee does bother him some from time to time. He is disabled, lives home alone, and is seperated from his wife. He does have an ICD in place. He is eager to start the program.  Expected Outcomes Short: Increase strength and stamina. Long: Improve SOB.    Continue Psychosocial Services  Follow up required by staff          Psychosocial Re-Evaluation:   Psychosocial Discharge (Final Psychosocial Re-Evaluation):   Vocational Rehabilitation: Provide vocational rehab assistance to qualifying candidates.   Vocational Rehab Evaluation & Intervention:  Vocational Rehab - 08/10/24 1316       Initial Vocational Rehab Evaluation & Intervention   Assessment shows need for Vocational Rehabilitation No      Vocational Rehab Re-Evaulation   Comments He is disabled and not going back to  work.          Education: Education Goals: Education classes will be provided on a weekly basis, covering required topics. Participant will state understanding/return demonstration of topics presented.  Learning Barriers/Preferences:  Learning Barriers/Preferences - 08/10/24 1316       Learning Barriers/Preferences   Learning Barriers None    Learning Preferences Group Instruction;Individual Instruction;Skilled Demonstration;Written Material          Education Topics: Hypertension, Hypertension Reduction -Define heart disease and high blood pressure. Discus how high blood pressure affects the body and ways to reduce high blood pressure. Flowsheet Row CARDIAC REHAB PHASE II EXERCISE from 08/04/2018 in [place] [facility] CARDIAC REHABILITATION  Date 07/28/18  Educator D. Coad  Instruction Review Code 2- Demonstrated Understanding    Exercise and Your Heart -Discuss why it is important to exercise, the FITT principles of exercise, normal and abnormal responses to exercise, and how to exercise safely. Flowsheet Row CARDIAC REHAB PHASE II EXERCISE from 05/14/2022 in [place] [facility]  Date 04/02/22  Educator HJ  Instruction Review Code 1- Verbalizes Understanding    Angina -Discuss definition of angina, causes of angina, treatment of angina, and how to decrease risk of having angina. Flowsheet Row CARDIAC REHAB PHASE II EXERCISE from 05/14/2022 in [place] [facility]IAC RE[facility]  Date 04/09/22  Educator HJ  Instruction Review Code 1- Verbalizes Understanding    Cardiac Medications -Review what the following cardiac medications are used for, how they affect the body, and side effects that may occur when taking the medications.  Medications include Aspirin , Beta blockers, calcium channel blockers, ACE Inhibitors, angiotensin receptor blockers, diuretics, digoxin, and antihyperlipidemics. Flowsheet Row CARDIAC REHAB PHASE II EXERCISE from 05/14/2022 in [place] [facility]  CARDIAC REHABILITATION  Date 04/16/22  Educator DF  Instruction Review Code 1- Verbalizes Understanding    Congestive Heart Failure -Discuss the definition of CHF, how to live with CHF, the signs and symptoms of CHF, and how keep track of weight and sodium intake. Flowsheet Row CARDIAC REHAB PHASE II EXERCISE from 05/14/2022 in [place] [facility] CARDIAC REHABILITATION  Date 04/23/22  Educator pb  Instruction Review Code 1- Verbalizes Understanding    Heart Disease and Intimacy -Discus the effect sexual activity has on the heart, how changes occur during intimacy as we age, and safety during sexual activity. Flowsheet Row CARDIAC REHAB PHASE II EXERCISE from 05/14/2022 in [place] [facility] CARDIAC RE[facility]ITATION  Date 04/30/22  Educator pb  Instruction Review Code 1- Verbalizes Understanding    Smoking Cessation / COPD -Discuss different methods to quit smoking, the health benefits of quitting smoking, and the definition of COPD. Flowsheet Row CARDIAC REHAB PHASE II EXERCISE from 05/14/2022 in [place] [facility] CARDIAC RE[facility]ITATION  Date 05/07/22  Educator hj  Instruction Review Code 2- Demonstrated Understanding    Nutrition I: Fats -Discuss the types of cholesterol, what cholesterol does to the heart, and how cholesterol  levels can be controlled. Flowsheet Row CARDIAC REHAB PHASE II EXERCISE from 05/14/2022 in [place] [facility] RE[facility]  Date 05/14/22  Educator DF  Instruction Review Code 2- Demonstrated Understanding    Nutrition II: Labels -Discuss the different components of food labels and how to read food label Flowsheet Row CARDIAC REHAB PHASE II EXERCISE from 08/04/2018 in [place] [facility]  Date 06/23/18  Educator CHARM Louder  Instruction Review Code 2- Demonstrated Understanding    Heart Parts/Heart Disease and PAD -Discuss the anatomy of the heart, the pathway of blood circulation through the heart, and these are affected by heart disease. Flowsheet Row  CARDIAC REHAB PHASE II EXERCISE from 05/14/2022 in [place] [facility]  Date 03/05/22  Educator HJ  Instruction Review Code 2- Demonstrated Understanding    Stress I: Signs and Symptoms -Discuss the causes of stress, how stress may lead to anxiety and depression, and ways to limit stress. Flowsheet Row CARDIAC REHAB PHASE II EXERCISE from 05/14/2022 in [place] [facility]  Date 03/12/22  Educator hj  Instruction Review Code 2- Demonstrated Understanding    Stress II: Relaxation -Discuss different types of relaxation techniques to limit stress. Flowsheet Row CARDIAC REHAB PHASE II EXERCISE from 05/14/2022 in [place] [facility]  Date 03/19/22  Educator HJ  Instruction Review Code 1- Verbalizes Understanding    Warning Signs of Stroke / TIA -Discuss definition of a stroke, what the signs and symptoms are of a stroke, and how to identify when someone is having stroke.   Knowledge Questionnaire Score:  Knowledge Questionnaire Score - 08/15/24 1431       Knowledge Questionnaire Score   Pre Score 24/26          Core Components/Risk Factors/Patient Goals at Admission:  Personal Goals and Risk Factors at Admission - 08/10/24 1314       Core Components/Risk Factors/Patient Goals on Admission    Weight Management Yes;Weight Maintenance    Intervention Weight Management: Develop a combined nutrition and exercise program designed to reach desired caloric intake, while maintaining appropriate intake of nutrient and fiber, sodium and fats, and appropriate energy expenditure required for the weight goal.;Weight Management: Provide education and appropriate resources to help participant work on and attain dietary goals.;Weight Management/Obesity: Establish reasonable short term and long term weight goals.    Expected Outcomes Short Term: Continue to assess and modify interventions until short term weight is achieved;Long Term: Adherence to nutrition  and physical activity/exercise program aimed toward attainment of established weight goal;Weight Maintenance: Understanding of the daily nutrition guidelines, which includes 25-35% calories from fat, 7% or less cal from saturated fats, less than 200mg  cholesterol, less than 1.5gm of sodium, & 5 or more servings of fruits and vegetables daily;Weight Loss: Understanding of general recommendations for a balanced deficit meal plan, which promotes 1-2 lb weight loss per week and includes a negative energy balance of [phone] kcal/d;Understanding recommendations for meals to include 15-35% energy as protein, 25-35% energy from fat, 35-60% energy from carbohydrates, less than 200mg  of dietary cholesterol, 20-35 gm of total fiber daily;Understanding of distribution of calorie intake throughout the day with the consumption of 4-5 meals/snacks    Improve shortness of breath with ADL's Yes    Intervention Provide education, individualized exercise plan and daily activity instruction to help decrease symptoms of SOB with activities of daily living.    Expected Outcomes Short Term: Improve cardiorespiratory fitness to achieve a reduction of symptoms when performing ADLs;Long Term: Be able to perform  more ADLs without symptoms or delay the onset of symptoms    Diabetes Yes    Intervention Provide education about signs/symptoms and action to take for hypo/hyperglycemia.;Provide education about proper nutrition, including hydration, and aerobic/resistive exercise prescription along with prescribed medications to achieve blood glucose in normal ranges: Fasting glucose 65-99 mg/dL    Expected Outcomes Short Term: Participant verbalizes understanding of the signs/symptoms and immediate care of hyper/hypoglycemia, proper foot care and importance of medication, aerobic/resistive exercise and nutrition plan for blood glucose control.;Long Term: Attainment of HbA1C < 7%.    Heart Failure Yes    Intervention Provide a combined  exercise and nutrition program that is supplemented with education, support and counseling about heart failure. Directed toward relieving symptoms such as shortness of breath, decreased exercise tolerance, and extremity edema.    Expected Outcomes Improve functional capacity of life;Short term: Attendance in program 2-3 days a week with increased exercise capacity. Reported lower sodium intake. Reported increased fruit and vegetable intake. Reports medication compliance.;Short term: Daily weights obtained and reported for increase. Utilizing diuretic protocols set by physician.;Long term: Adoption of self-care skills and reduction of barriers for early signs and symptoms recognition and intervention leading to self-care maintenance.    Hypertension Yes    Intervention Provide education on lifestyle modifcations including regular physical activity/exercise, weight management, moderate sodium restriction and increased consumption of fresh fruit, vegetables, and low fat dairy, alcohol moderation, and smoking cessation.;Monitor prescription use compliance.    Expected Outcomes Short Term: Continued assessment and intervention until BP is < 140/6mm HG in hypertensive participants. < 130/39mm HG in hypertensive participants with diabetes, heart failure or chronic kidney disease.;Long Term: Maintenance of blood pressure at goal levels.    Lipids Yes    Intervention Provide education and support for participant on nutrition & aerobic/resistive exercise along with prescribed medications to achieve LDL 70mg , HDL >40mg .    Expected Outcomes Short Term: Participant states understanding of desired cholesterol values and is compliant with medications prescribed. Participant is following exercise prescription and nutrition guidelines.;Long Term: Cholesterol controlled with medications as prescribed, with individualized exercise RX and with personalized nutrition plan. Value goals: LDL < 70mg , HDL > 40 mg.           Core Components/Risk Factors/Patient Goals Review:    Core Components/Risk Factors/Patient Goals at Discharge (Final Review):    ITP Comments:  ITP Comments     Row Name 08/10/24 1309 08/15/24 1508 08/17/24 0924 08/23/24 0934     ITP Comments Completed virtual orientation today.  EP evaluation is scheduled for 08/15/24 at 2:15 pm.  Documentation for diagnosis can be found in [facility] encounter. Patient attend orientation today.  Patient is attending Cardiac Rehabilitation Program.  Documentation for diagnosis can be found in [facility].  Reviewed medical chart, RPE/RPD, gym safety, and program guidelines.  Patient was fitted to equipment they will be using during rehab.  Patient is scheduled to start exercise on 08/17/24.   Initial ITP created and sent for review and signature by Dr. Dorn Ross, Medical Director for Cardiac Rehabilitation Program. First full day of exercise!  Patient was oriented to gym and equipment including functions, settings, policies, and procedures.  Patient's individual exercise prescription and treatment plan were reviewed.  All starting workloads were established based on the results of the 6 minute walk test done at initial orientation visit.  The plan for exercise progression was also introduced and progression will be customized based on patient's performance and goals. 30 day review completed. ITP  sent to Dr. Dorn Ross, Medical Director of Cardiac Rehab. Continue with ITP unless changes are made by physician.    New to program       Comments: 30 day review

## 2024-08-24 ENCOUNTER — Encounter (HOSPITAL_COMMUNITY)
Admission: RE | Admit: 2024-08-24 | Discharge: 2024-08-24 | Disposition: A | Source: Ambulatory Visit | Attending: Internal Medicine

## 2024-08-24 DIAGNOSIS — I214 Non-ST elevation (NSTEMI) myocardial infarction: Secondary | ICD-10-CM | POA: Diagnosis not present

## 2024-08-24 LAB — GLUCOSE, CAPILLARY
Glucose-Capillary: 144 mg/dL — ABNORMAL HIGH (ref 70–99)
Glucose-Capillary: 146 mg/dL — ABNORMAL HIGH (ref 70–99)

## 2024-08-24 NOTE — Progress Notes (Signed)
 Daily Session Note  Patient Details  Name: ZAYDON KINSER MRN: [id number] Date of Birth: 1961/03/29 Referring Provider:   [name] from 08/15/2024 in [facility] CARDIAC REHABILITATION  Referring Provider Irven Alberta MD    Encounter Date: 08/24/2024  Check In:  Session Check In - 08/24/24 0915       Check-In   Supervising physician immediately available to respond to emergencies See telemetry face sheet for immediately available MD    Location [facility]    Staff Present Rolland Sake BSN, RN;Wania Longstreth Vicci, RN, BSN;Heather Con, BS, Exercise Physiologist;Brittany Jackquline, BSN, RN, WTA-C    Virtual Visit No    Medication changes reported     No    Fall or balance concerns reported    No    Warm-up and Cool-down Performed on first and last piece of equipment    Resistance Training Performed Yes    VAD Patient? No    PAD/SET Patient? No      Pain Assessment   Currently in Pain? No/denies    Multiple Pain Sites No          Capillary Blood Glucose: Results for orders placed or performed during the hospital encounter of 08/24/24 (from the past 24 hours)  Glucose, capillary     Status: Abnormal   Collection Time: 08/24/24  9:23 AM  Result Value Ref Range   Glucose-Capillary 144 (H) 70 - 99 mg/dL      Social History   Tobacco Use  Smoking Status Former   Current packs/day: 0.00   Average packs/day: 1 pack/day for 5.0 years (5.0 ttl pk-yrs)   Types: Cigarettes   Start date: 09/22/1984   Quit date: 09/22/1989   Years since quitting: 34.9  Smokeless Tobacco Former   Types: Snuff   Quit date: 09/22/1989  Tobacco Comments   dipped snuff for one year    Goals Met:  Independence with exercise equipment Exercise tolerated well No report of concerns or symptoms today Strength training completed today  Goals Unmet:  Not Applicable  Comments: Pt able to follow exercise prescription today without complaint.   Will continue to monitor for progression.

## 2024-08-26 ENCOUNTER — Encounter (HOSPITAL_COMMUNITY)
Admission: RE | Admit: 2024-08-26 | Discharge: 2024-08-26 | Disposition: A | Discharge status: Home / Self Care | Source: Ambulatory Visit | Attending: Internal Medicine | Admitting: Internal Medicine

## 2024-08-26 DIAGNOSIS — I214 Non-ST elevation (NSTEMI) myocardial infarction: Secondary | ICD-10-CM

## 2024-08-26 LAB — GLUCOSE, CAPILLARY: Glucose-Capillary: 125 mg/dL — ABNORMAL HIGH (ref 70–99)

## 2024-08-26 NOTE — Progress Notes (Signed)
 Daily Session Note  Patient Details  Name: Michael Vaughn MRN: [id number] Date of Birth: August 18, 1961 Referring Provider:   [name] from 08/15/2024 in [facility] CARDIAC REHABILITATION  Referring Provider Irven Alberta MD    Encounter Date: 08/26/2024  Check In:  Session Check In - 08/26/24 0917       Check-In   Supervising physician immediately available to respond to emergencies See telemetry face sheet for immediately available MD    Location [facility]    Staff Present Powell Benders, BS, Exercise Physiologist;Brittany Jackquline, BSN, RN, Rosalba Gelineau, MA, RCEP, CCRP, CCET    Virtual Visit No    Fall or balance concerns reported    No    Tobacco Cessation No Change    Warm-up and Cool-down Performed on first and last piece of equipment    Resistance Training Performed Yes    VAD Patient? No    PAD/SET Patient? No      Pain Assessment   Currently in Pain? No/denies          Capillary Blood Glucose: Results for orders placed or performed during the hospital encounter of 08/26/24 (from the past 24 hours)  Glucose, capillary     Status: Abnormal   Collection Time: 08/26/24  9:14 AM  Result Value Ref Range   Glucose-Capillary 125 (H) 70 - 99 mg/dL      Social History   Tobacco Use  Smoking Status Former   Current packs/day: 0.00   Average packs/day: 1 pack/day for 5.0 years (5.0 ttl pk-yrs)   Types: Cigarettes   Start date: 09/22/1984   Quit date: 09/22/1989   Years since quitting: 34.9  Smokeless Tobacco Former   Types: Snuff   Quit date: 09/22/1989  Tobacco Comments   dipped snuff for one year    Goals Met:  Independence with exercise equipment Exercise tolerated well No report of concerns or symptoms today Strength training completed today  Goals Unmet:  Not Applicable  Comments: Pt able to follow exercise prescription today without complaint.  Will continue to monitor for progression.

## 2024-08-29 ENCOUNTER — Encounter (HOSPITAL_COMMUNITY)
Admission: RE | Admit: 2024-08-29 | Discharge: 2024-08-29 | Disposition: A | Source: Ambulatory Visit | Attending: Internal Medicine

## 2024-08-29 DIAGNOSIS — I214 Non-ST elevation (NSTEMI) myocardial infarction: Secondary | ICD-10-CM

## 2024-08-29 NOTE — Progress Notes (Signed)
 Daily Session Note  Patient Details  Name: Michael Vaughn MRN: [id number] Date of Birth: 1961-08-31 Referring Provider:   [name] from 08/15/2024 in [facility] CARDIAC REHABILITATION  Referring Provider Irven Alberta MD    Encounter Date: 08/29/2024  Check In:  Session Check In - 08/29/24 1212       Check-In   Supervising physician immediately available to respond to emergencies See telemetry face sheet for immediately available MD    Location [facility]    Staff Present Powell Benders, BS, Exercise Physiologist;Kelleen Stolze Jackquline, BSN, RN, WTA-C    Virtual Visit No    Medication changes reported     No    Fall or balance concerns reported    No    Tobacco Cessation No Change    Warm-up and Cool-down Performed on first and last piece of equipment    Resistance Training Performed Yes    VAD Patient? No    PAD/SET Patient? No      Pain Assessment   Currently in Pain? No/denies          Capillary Blood Glucose: No results found for this or any previous visit (from the past 24 hours).    Social History   Tobacco Use  Smoking Status Former   Current packs/day: 0.00   Average packs/day: 1 pack/day for 5.0 years (5.0 ttl pk-yrs)   Types: Cigarettes   Start date: 09/22/1984   Quit date: 09/22/1989   Years since quitting: 34.9  Smokeless Tobacco Former   Types: Snuff   Quit date: 09/22/1989  Tobacco Comments   dipped snuff for one year    Goals Met:  Independence with exercise equipment Exercise tolerated well No report of concerns or symptoms today Strength training completed today  Goals Unmet:  Not Applicable  Comments: Pt able to follow exercise prescription today without complaint.  Will continue to monitor for progression.

## 2024-08-31 ENCOUNTER — Encounter (HOSPITAL_COMMUNITY)
Admission: RE | Admit: 2024-08-31 | Discharge: 2024-08-31 | Disposition: A | Discharge status: Home / Self Care | Source: Ambulatory Visit | Attending: Internal Medicine | Admitting: Internal Medicine

## 2024-08-31 DIAGNOSIS — I214 Non-ST elevation (NSTEMI) myocardial infarction: Secondary | ICD-10-CM | POA: Diagnosis not present

## 2024-08-31 NOTE — Progress Notes (Signed)
 Daily Session Note  Patient Details  Name: Michael Vaughn MRN: [id number] Date of Birth: 1961-01-22 Referring Provider:   [name] from 08/15/2024 in [facility] CARDIAC REHABILITATION  Referring Provider Irven Alberta MD    Encounter Date: 08/31/2024  Check In:  Session Check In - 08/31/24 0936       Check-In   Supervising physician immediately available to respond to emergencies See telemetry face sheet for immediately available MD    Location [facility]    Staff Present Powell Benders, BS, Exercise Physiologist;Laporsha Grealish Jackquline, BSN, RN, Rosalba Gelineau, MA, RCEP, CCRP, CCET    Virtual Visit No    Medication changes reported     No    Fall or balance concerns reported    No    Tobacco Cessation No Change    Warm-up and Cool-down Performed on first and last piece of equipment    Resistance Training Performed Yes    VAD Patient? No    PAD/SET Patient? No      Pain Assessment   Currently in Pain? No/denies          Capillary Blood Glucose: No results found for this or any previous visit (from the past 24 hours).    Social History   Tobacco Use  Smoking Status Former   Current packs/day: 0.00   Average packs/day: 1 pack/day for 5.0 years (5.0 ttl pk-yrs)   Types: Cigarettes   Start date: 09/22/1984   Quit date: 09/22/1989   Years since quitting: 34.9  Smokeless Tobacco Former   Types: Snuff   Quit date: 09/22/1989  Tobacco Comments   dipped snuff for one year    Goals Met:  Independence with exercise equipment Exercise tolerated well No report of concerns or symptoms today Strength training completed today  Goals Unmet:  Not Applicable  Comments: Pt able to follow exercise prescription today without complaint.  Will continue to monitor for progression.

## 2024-09-02 ENCOUNTER — Encounter (HOSPITAL_COMMUNITY)
Admission: RE | Admit: 2024-09-02 | Discharge: 2024-09-02 | Disposition: A | Source: Ambulatory Visit | Attending: Internal Medicine

## 2024-09-02 DIAGNOSIS — I214 Non-ST elevation (NSTEMI) myocardial infarction: Secondary | ICD-10-CM

## 2024-09-02 NOTE — Progress Notes (Signed)
 Daily Session Note  Patient Details  Name: Michael Vaughn MRN: [id number] Date of Birth: 1961-09-02 Referring Provider:   [name] from 08/15/2024 in [facility] CARDIAC REHABILITATION  Referring Provider Irven Alberta MD    Encounter Date: 09/02/2024  Check In:  Session Check In - 09/02/24 9077       Check-In   Supervising physician immediately available to respond to emergencies See telemetry face sheet for immediately available MD    Location [facility]    Staff Present Powell Benders, BS, Exercise Physiologist;Uchenna Rappaport Zina, RN;Brittany Jackquline, BSN, RN, WTA-C    Virtual Visit No    Medication changes reported     No    Fall or balance concerns reported    No    Warm-up and Cool-down Performed on first and last piece of equipment    Resistance Training Performed Yes    VAD Patient? No    PAD/SET Patient? No      Pain Assessment   Currently in Pain? No/denies          Capillary Blood Glucose: No results found for this or any previous visit (from the past 24 hours).    Tobacco Use History[1]  Goals Met:  Independence with exercise equipment Exercise tolerated well No report of concerns or symptoms today Strength training completed today  Goals Unmet:  Not Applicable  Comments: Pt able to follow exercise prescription today without complaint.  Will continue to monitor for progression.        [1]  Social History Tobacco Use  Smoking Status Former   Current packs/day: 0.00   Average packs/day: 1 pack/day for 5.0 years (5.0 ttl pk-yrs)   Types: Cigarettes   Start date: 09/22/1984   Quit date: 09/22/1989   Years since quitting: 34.9  Smokeless Tobacco Former   Types: Snuff   Quit date: 09/22/1989  Tobacco Comments   dipped snuff for one year

## 2024-09-05 ENCOUNTER — Encounter (HOSPITAL_COMMUNITY)
Admission: RE | Admit: 2024-09-05 | Discharge: 2024-09-05 | Disposition: A | Discharge status: Home / Self Care | Source: Ambulatory Visit | Attending: Internal Medicine | Admitting: Internal Medicine

## 2024-09-05 DIAGNOSIS — I214 Non-ST elevation (NSTEMI) myocardial infarction: Secondary | ICD-10-CM

## 2024-09-05 NOTE — Progress Notes (Signed)
 Daily Session Note  Patient Details  Name: Michael Vaughn MRN: [id number] Date of Birth: 1961/09/20 Referring Provider:   [name] from 08/15/2024 in [facility] CARDIAC REHABILITATION  Referring Provider Irven Alberta MD    Encounter Date: 09/05/2024  Check In:   Capillary Blood Glucose: No results found for this or any previous visit (from the past 24 hours).    Tobacco Use History[1]  Goals Met:  Independence with exercise equipment Exercise tolerated well No report of concerns or symptoms today Strength training completed today  Goals Unmet:  Not Applicable  Comments: Pt able to follow exercise prescription today without complaint.  Will continue to monitor for progression.        [1]  Social History Tobacco Use  Smoking Status Former   Current packs/day: 0.00   Average packs/day: 1 pack/day for 5.0 years (5.0 ttl pk-yrs)   Types: Cigarettes   Start date: 09/22/1984   Quit date: 09/22/1989   Years since quitting: 34.9  Smokeless Tobacco Former   Types: Snuff   Quit date: 09/22/1989  Tobacco Comments   dipped snuff for one year

## 2024-09-07 ENCOUNTER — Encounter (HOSPITAL_COMMUNITY): Admission: RE | Admit: 2024-09-07 | Discharge: 2024-09-07 | Attending: Internal Medicine

## 2024-09-07 DIAGNOSIS — I214 Non-ST elevation (NSTEMI) myocardial infarction: Secondary | ICD-10-CM

## 2024-09-07 NOTE — Progress Notes (Signed)
 Daily Session Note  Patient Details  Name: Michael Vaughn MRN: [id number] Date of Birth: 08/19/1961 Referring Provider:   [name] from 08/15/2024 in [facility] CARDIAC REHABILITATION  Referring Provider Irven Alberta MD    Encounter Date: 09/07/2024  Check In:  Session Check In - 09/07/24 0925       Check-In   Supervising physician immediately available to respond to emergencies See telemetry face sheet for immediately available MD    Location [facility]    Staff Present Laymon Rattler, BSN, RN, Rosalba Gelineau, MA, RCEP, CCRP, CCET;Bond [employer], RN    Virtual Visit No    Medication changes reported     No    Fall or balance concerns reported    No    Tobacco Cessation No Change    Warm-up and Cool-down Performed on first and last piece of equipment    Resistance Training Performed Yes    VAD Patient? No    PAD/SET Patient? No      Pain Assessment   Currently in Pain? No/denies    Multiple Pain Sites No          Capillary Blood Glucose: No results found for this or any previous visit (from the past 24 hours).    Tobacco Use History[1]  Goals Met:  Independence with exercise equipment Exercise tolerated well No report of concerns or symptoms today Strength training completed today  Goals Unmet:  Not Applicable  Comments: .Pt able to follow exercise prescription today without complaint.  Will continue to monitor for progression.       [1]  Social History Tobacco Use  Smoking Status Former   Current packs/day: 0.00   Average packs/day: 1 pack/day for 5.0 years (5.0 ttl pk-yrs)   Types: Cigarettes   Start date: 09/22/1984   Quit date: 09/22/1989   Years since quitting: 34.9  Smokeless Tobacco Former   Types: Snuff   Quit date: 09/22/1989  Tobacco Comments   dipped snuff for one year

## 2024-09-09 ENCOUNTER — Encounter (HOSPITAL_COMMUNITY): Admission: RE | Admit: 2024-09-09 | Discharge: 2024-09-09 | Attending: Internal Medicine

## 2024-09-09 DIAGNOSIS — I214 Non-ST elevation (NSTEMI) myocardial infarction: Secondary | ICD-10-CM

## 2024-09-09 NOTE — Progress Notes (Signed)
 Daily Session Note  Patient Details  Name: Michael Vaughn MRN: [id number] Date of Birth: Aug 13, 1961 Referring Provider:   [name] from 08/15/2024 in [facility] CARDIAC REHABILITATION  Referring Provider Irven Alberta MD    Encounter Date: 09/09/2024  Check In:  Session Check In - 09/09/24 0918       Check-In   Supervising physician immediately available to respond to emergencies See telemetry face sheet for immediately available MD    Location [facility]    Staff Present Harlene Gelineau, MA, RCEP, CCRP, CCET;Luwanna Brossman [place], RN    Virtual Visit No    Medication changes reported     No    Fall or balance concerns reported    No    Warm-up and Cool-down Performed on first and last piece of equipment    Resistance Training Performed Yes    VAD Patient? No    PAD/SET Patient? No      Pain Assessment   Currently in Pain? No/denies          Capillary Blood Glucose: No results found for this or any previous visit (from the past 24 hours).    Tobacco Use History[1]  Goals Met:  Independence with exercise equipment Exercise tolerated well No report of concerns or symptoms today Strength training completed today  Goals Unmet:  Not Applicable  Comments: Pt able to follow exercise prescription today without complaint.  Will continue to monitor for progression.        [1]  Social History Tobacco Use  Smoking Status Former   Current packs/day: 0.00   Average packs/day: 1 pack/day for 5.0 years (5.0 ttl pk-yrs)   Types: Cigarettes   Start date: 09/22/1984   Quit date: 09/22/1989   Years since quitting: 34.9  Smokeless Tobacco Former   Types: Snuff   Quit date: 09/22/1989  Tobacco Comments   dipped snuff for one year

## 2024-09-12 ENCOUNTER — Encounter (HOSPITAL_COMMUNITY): Admission: RE | Admit: 2024-09-12 | Discharge: 2024-09-12 | Attending: Internal Medicine

## 2024-09-12 DIAGNOSIS — I214 Non-ST elevation (NSTEMI) myocardial infarction: Secondary | ICD-10-CM | POA: Diagnosis not present

## 2024-09-12 NOTE — Progress Notes (Signed)
 Daily Session Note  Patient Details  Name: Michael Vaughn MRN: [id number] Date of Birth: Jan 31, 1961 Referring Provider:   [name] from 08/15/2024 in [facility] CARDIAC REHABILITATION  Referring Provider Irven Alberta MD    Encounter Date: 09/12/2024  Check In:  Session Check In - 09/12/24 0920       Check-In   Supervising physician immediately available to respond to emergencies See telemetry face sheet for immediately available MD    Location [facility]    Staff Present Laymon Rattler, BSN, RN, WTA-C;Heather Con, BS, Exercise Physiologist;Jessica Vonzell, MA, RCEP, CCRP, CCET    Virtual Visit No    Medication changes reported     No    Fall or balance concerns reported    No    Tobacco Cessation No Change    Warm-up and Cool-down Performed on first and last piece of equipment    Resistance Training Performed Yes    VAD Patient? No    PAD/SET Patient? No      Pain Assessment   Currently in Pain? No/denies          Capillary Blood Glucose: No results found for this or any previous visit (from the past 24 hours).    Tobacco Use History[1]  Goals Met:  Independence with exercise equipment Exercise tolerated well No report of concerns or symptoms today Strength training completed today  Goals Unmet:  Not Applicable  Comments: Pt able to follow exercise prescription today without complaint.  Will continue to monitor for progression.         [1]  Social History Tobacco Use  Smoking Status Former   Current packs/day: 0.00   Average packs/day: 1 pack/day for 5.0 years (5.0 ttl pk-yrs)   Types: Cigarettes   Start date: 09/22/1984   Quit date: 09/22/1989   Years since quitting: 34.9  Smokeless Tobacco Former   Types: Snuff   Quit date: 09/22/1989  Tobacco Comments   dipped snuff for one year

## 2024-09-14 ENCOUNTER — Ambulatory Visit (HOSPITAL_COMMUNITY)

## 2024-09-19 ENCOUNTER — Encounter (HOSPITAL_COMMUNITY)
Admission: RE | Admit: 2024-09-19 | Discharge: 2024-09-19 | Disposition: A | Discharge status: Home / Self Care | Source: Ambulatory Visit | Attending: Internal Medicine | Admitting: Internal Medicine

## 2024-09-19 DIAGNOSIS — I214 Non-ST elevation (NSTEMI) myocardial infarction: Secondary | ICD-10-CM

## 2024-09-19 NOTE — Progress Notes (Signed)
 Daily Session Note  Patient Details  Name: Michael Vaughn MRN: [id number] Date of Birth: Mar 02, 1961 Referring Provider:   [name] from 08/15/2024 in [facility]  Referring Provider Irven Alberta MD    Encounter Date: 09/19/2024  Check In:  Session Check In - 09/19/24 0917       Check-In   Supervising physician immediately available to respond to emergencies See telemetry face sheet for immediately available MD    Location [facility]    Staff Present Laymon Rattler, BSN, RN, Rosalba Gelineau, MA, RCEP, CCRP, CCET    Virtual Visit No    Medication changes reported     No    Fall or balance concerns reported    No    Tobacco Cessation No Change    Warm-up and Cool-down Performed on first and last piece of equipment    Resistance Training Performed Yes    VAD Patient? No    PAD/SET Patient? No      Pain Assessment   Currently in Pain? No/denies          Capillary Blood Glucose: No results found for this or any previous visit (from the past 24 hours).    Tobacco Use History[1]  Goals Met:  Independence with exercise equipment Exercise tolerated well No report of concerns or symptoms today Strength training completed today  Goals Unmet:  Not Applicable  Comments: Pt able to follow exercise prescription today without complaint.  Will continue to monitor for progression.        [1]  Social History Tobacco Use  Smoking Status Former   Current packs/day: 0.00   Average packs/day: 1 pack/day for 5.0 years (5.0 ttl pk-yrs)   Types: Cigarettes   Start date: 09/22/1984   Quit date: 09/22/1989   Years since quitting: 35.0  Smokeless Tobacco Former   Types: Snuff   Quit date: 09/22/1989  Tobacco Comments   dipped snuff for one year

## 2024-09-20 ENCOUNTER — Encounter (HOSPITAL_COMMUNITY): Payer: Self-pay | Admitting: *Deleted

## 2024-09-20 DIAGNOSIS — I214 Non-ST elevation (NSTEMI) myocardial infarction: Secondary | ICD-10-CM

## 2024-09-20 NOTE — Progress Notes (Signed)
 Cardiac Individual Treatment Plan  Patient Details  Name: Michael Vaughn MRN: [id number] Date of Birth: Mar 31, 1961 Referring Provider:   [name] from 08/15/2024 in [facility] CARDIAC REHABILITATION  Referring Provider Irven Alberta MD    Initial Encounter Date:  [name] from 08/15/2024 in [place] CARDIAC RE[facility]ITATION  Date 08/15/24    Visit Diagnosis: NSTEMI (non-ST elevated myocardial infarction) [facility])  Patient's Home Medications on Admission: Current Medications[1]  Past Medical History: Past Medical History:  Diagnosis Date   Anginal pain    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   Diabetes mellitus    Dyslipidemia    Dysrhythmia    Ejection fraction    Improved from the past  /  ejection fraction 50%, echo, July, 2012, hypokinesis at the base of the inferior wall.   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         Capillary Blood Glucose: Lab Results  Component Value Date   GLUCAP 125 (H) 08/26/2024   GLUCAP 146 (H) 08/24/2024   GLUCAP 144 (H) 08/24/2024   GLUCAP 134 (H) 08/22/2024   GLUCAP 97 08/22/2024     Exercise Target Goals: Exercise Program Goal: Individual exercise prescription set using results from initial 6 min walk test and THRR while considering  patients activity barriers and safety.   Exercise Prescription Goal: Starting with aerobic activity 30 plus minutes a day, 3 days per week for initial exercise prescription. Provide home exercise prescription and guidelines that participant acknowledges understanding prior to discharge.  Activity Barriers & Risk Stratification:  Activity Barriers & Cardiac Risk Stratification - 08/10/24 1301       Activity Barriers & Cardiac Risk Stratification   Activity Barriers Muscular Weakness;Deconditioning;Chest Pain/Angina;Shortness of Breath    Cardiac Risk Stratification High          6 Minute Walk:  6 Minute Walk     Row Name 08/15/24 1457         6 Minute Walk   Phase Initial     Distance 1400 feet     Walk Time 6 minutes     # of Rest Breaks 0     MPH 2.65     METS 2.2     RPE 12  Perceived Dyspnea  0     VO2 Peak 11.6     Symptoms No     Resting HR 76 bpm     Resting BP 106/70     Resting Oxygen  Saturation  99 %     Exercise Oxygen  Saturation  during 6 min walk 99 %     Max Ex. HR 136 bpm     Max Ex. BP 128/60     2 Minute Post BP 110/64        Oxygen  Initial Assessment:   Oxygen  Re-Evaluation:   Oxygen  Discharge (Final Oxygen  Re-Evaluation):   Initial Exercise Prescription:  Initial Exercise Prescription - 08/15/24 1400       Date of Initial Exercise RX and Referring Provider   Date 08/15/24    Referring Provider Irven Alberta MD      Treadmill   MPH 1.5    Grade 0    Minutes 15    METs 2.15      NuStep   Level 1    SPM 50    Minutes 15    METs 1.9       Prescription Details   Frequency (times per week) 3    Duration Progress to 30 minutes of continuous aerobic without signs/symptoms of physical distress      Intensity   THRR 40-80% of Max Heartrate 141-108    Ratings of Perceived Exertion 11-13    Perceived Dyspnea 0-4      Resistance Training   Training Prescription Yes    Weight 4.    Reps 10-15          Perform Capillary Blood Glucose checks as needed.  Exercise Prescription Changes:   Exercise Prescription Changes     Row Name 08/15/24 1500 09/05/24 1500           Response to Exercise   Blood Pressure (Admit) 106/70 100/60      Blood Pressure (Exercise) 128/60 118/60      Blood Pressure (Exit) 110/64 102/60      Heart Rate (Admit) 76 bpm 114 bpm      Heart Rate (Exercise) 136 bpm 121 bpm      Heart Rate (Exit) 84 bpm 99 bpm      Oxygen  Saturation (Admit) 99 % --      Oxygen  Saturation (Exercise) 99 % --      Oxygen  Saturation (Exit) 99 % --      Rating of Perceived Exertion (Exercise) 12 11      Perceived Dyspnea (Exercise) 0 --      Duration -- Continue with 30 min of aerobic exercise without signs/symptoms of physical distress.      Intensity -- THRR unchanged        Progression   Progression -- Continue to progress workloads to maintain intensity without signs/symptoms of physical distress.        Resistance Training   Weight -- 4      Reps -- 10-15        Treadmill   MPH -- 2.4      Grade -- 1      Minutes -- 15      METs -- 3.17        NuStep   Level -- 6      SPM -- 102      Minutes -- 15      METs -- 3.3         Exercise Comments:   Exercise Comments  Row Name 08/10/24 1310 08/15/24 1508 08/17/24 0924       Exercise Comments [name] states he is not doing much exercise right now. He was an active member at the [facility] recently before all the heart events. Patient attend orientation today.  Patient is attending Cardiac Rehabilitation Program.  Documentation for diagnosis can be found in  [facility].  Reviewed medical chart, RPE/RPD, gym safety, and program guidelines.  Patient was fitted to equipment they will be using during rehab.  Patient is scheduled to start exercise on 08/17/24.   Initial ITP created and sent for review and signature by Dr. Dorn Ross, Medical Director for Cardiac Rehabilitation Program. First full day of exercise!  Patient was oriented to gym and equipment including functions, settings, policies, and procedures.  Patient's individual exercise prescription and treatment plan were reviewed.  All starting workloads were established based on the results of the 6 minute walk test done at initial orientation visit.  The plan for exercise progression was also introduced and progression will be customized based on patient's performance and goals.        Exercise Goals and Review:   Exercise Goals     Row Name 08/10/24 1313             Exercise Goals   Increase Physical Activity Yes       Intervention Develop an individualized exercise prescription for aerobic and resistive training based on initial evaluation findings, risk stratification, comorbidities and participant's personal goals.;Provide advice, education, support and counseling about physical activity/exercise needs.       Expected Outcomes Short Term: Attend rehab on a regular basis to increase amount of physical activity.;Long Term: Exercising regularly at least 3-5 days a week.;Long Term: Add in home exercise to make exercise part of routine and to increase amount of physical activity.       Increase Strength and Stamina Yes       Intervention Provide advice, education, support and counseling about physical activity/exercise needs.;Develop an individualized exercise prescription for aerobic and resistive training based on initial evaluation findings, risk stratification, comorbidities and participant's personal goals.       Expected Outcomes Short Term: Perform resistance training exercises routinely  during rehab and add in resistance training at home;Short Term: Increase workloads from initial exercise prescription for resistance, speed, and METs.;Long Term: Improve cardiorespiratory fitness, muscular endurance and strength as measured by increased METs and functional capacity ( )       Able to understand and use rate of perceived exertion (RPE) scale Yes       Intervention Provide education and explanation on how to use RPE scale       Expected Outcomes Short Term: Able to use RPE daily in rehab to express subjective intensity level;Long Term:  Able to use RPE to guide intensity level when exercising independently       Knowledge and understanding of Target Heart Rate Range (THRR) Yes       Intervention Provide education and explanation of THRR including how the numbers were predicted and where they are located for reference       Expected Outcomes Short Term: Able to state/look up THRR;Long Term: Able to use THRR to govern intensity when exercising independently;Short Term: Able to use daily as guideline for intensity in rehab       Able to check pulse independently Yes       Intervention Provide education and demonstration on how to check pulse in carotid and radial arteries.;Review the importance  of being able to check your own pulse for safety during independent exercise       Expected Outcomes Short Term: Able to explain why pulse checking is important during independent exercise;Long Term: Able to check pulse independently and accurately       Understanding of Exercise Prescription Yes       Intervention Provide education, explanation, and written materials on patient's individual exercise prescription       Expected Outcomes Short Term: Able to explain program exercise prescription;Long Term: Able to explain home exercise prescription to exercise independently          Exercise Goals Re-Evaluation :  Exercise Goals Re-Evaluation     Row Name 08/17/24 0924 08/31/24 1003 09/06/24  0848         Exercise Goal Re-Evaluation   Exercise Goals Review Increase Physical Activity;Increase Strength and Stamina;Able to understand and use Dyspnea scale;Able to understand and use rate of perceived exertion (RPE) scale;Knowledge and understanding of Target Heart Rate Range (THRR);Able to check pulse independently;Understanding of Exercise Prescription Increase Physical Activity;Increase Strength and Stamina;Understanding of Exercise Prescription Increase Physical Activity;Understanding of Exercise Prescription     Comments Reviewed RPE and dyspnea scale, THR and program prescription with pt today.  Pt voiced understanding and was given a copy of goals to take home. Deondray is doing well in rehab. He is already increasing his levels on the nustep from level 3 to level 6. He is exercising some at home when he can. He enjoys coming to class Caiden is doing well in rehab and has completed 9 sessions of CR. He is inceasing his levels on the nustep and also increasing him walking speed on the treadmill. Will continue to monitornand progress as able     Expected Outcomes Short: Use RPE daily to regulate intensity.  Long: Follow program prescription in THR. Shirt: continue to attend rehab   long: continue to exercise at home Short: continue to attend rehab   long: add exercise outside of rehab         Discharge Exercise Prescription (Final Exercise Prescription Changes):  Exercise Prescription Changes - 09/05/24 1500       Response to Exercise   Blood Pressure (Admit) 100/60    Blood Pressure (Exercise) 118/60    Blood Pressure (Exit) 102/60    Heart Rate (Admit) 114 bpm    Heart Rate (Exercise) 121 bpm    Heart Rate (Exit) 99 bpm    Rating of Perceived Exertion (Exercise) 11    Duration Continue with 30 min of aerobic exercise without signs/symptoms of physical distress.    Intensity THRR unchanged      Progression   Progression Continue to progress workloads to maintain intensity without  signs/symptoms of physical distress.      Resistance Training   Weight 4    Reps 10-15      Treadmill   MPH 2.4    Grade 1    Minutes 15    METs 3.17      NuStep   Level 6    SPM 102    Minutes 15    METs 3.3          Nutrition:  Target Goals: Understanding of nutrition guidelines, daily intake of sodium 1500mg , cholesterol 200mg , calories 30% from fat and 7% or less from saturated fats, daily to have 5 or more servings of fruits and vegetables.  Biometrics:  Pre Biometrics - 08/15/24 1501       Pre Biometrics  Height 5' 10.5 (1.791 m)    Weight 251 lb 5.2 oz (114 kg)    Waist Circumference 47 inches    Hip Circumference 44.5 inches    Waist to Hip Ratio 1.06 %    BMI (Calculated) 35.54    Grip Strength 25.1 kg    Single Leg Stand 30 seconds           Nutrition Therapy Plan and Nutrition Goals:  Nutrition Therapy & Goals - 08/10/24 1316       Intervention Plan   Intervention Prescribe, educate and counsel regarding individualized specific dietary modifications aiming towards targeted core components such as weight, hypertension, lipid management, diabetes, heart failure and other comorbidities.;Nutrition handout(s) given to patient.    Expected Outcomes Long Term Goal: Adherence to prescribed nutrition plan.;Short Term Goal: A plan has been developed with personal nutrition goals set during dietitian appointment.;Short Term Goal: Understand basic principles of dietary content, such as calories, fat, sodium, cholesterol and nutrients.          Nutrition Assessments:  Nutrition Assessments - 08/15/24 1434       MEDFICTS Scores   Pre Score 72         MEDIFICTS Score Key: >=70 Need to make dietary changes  40-70 Heart Healthy Diet <= 40 Therapeutic Level Cholesterol Diet  [name] from 08/15/2024 in [facility] CARDIAC REHABILITATION  Picture Your Plate Total Score on Admission 72   Picture Your Plate  Scores: <59 Unhealthy dietary pattern with much room for improvement. 41-50 Dietary pattern unlikely to meet recommendations for good health and room for improvement. 51-60 More healthful dietary pattern, with some room for improvement.  >60 Healthy dietary pattern, although there may be some specific behaviors that could be improved.    Nutrition Goals Re-Evaluation:  Nutrition Goals Re-Evaluation     Row Name 08/31/24 1007             Goals   Nutrition Goal Healthy eating       Comment Hance is doing well in rehab. He stated taht he does watch what he eats and does eat smaller portions. He is on a water limit but does drink what he is allowed throughtout the day.He is diabetic so he keeps track of his sweets. He is doing well       Expected Outcome Short: continue to monitor sweets    long: continue with portion control and healthy choices          Nutrition Goals Discharge (Final Nutrition Goals Re-Evaluation):  Nutrition Goals Re-Evaluation - 08/31/24 1007       Goals   Nutrition Goal Healthy eating    Comment Nevyn is doing well in rehab. He stated taht he does watch what he eats and does eat smaller portions. He is on a water limit but does drink what he is allowed throughtout the day.He is diabetic so he keeps track of his sweets. He is doing well    Expected Outcome Short: continue to monitor sweets    long: continue with portion control and healthy choices          Psychosocial: Target Goals: Acknowledge presence or absence of significant depression and/or stress, maximize coping skills, provide positive support system. Participant is able to verbalize types and ability to use techniques and skills needed for reducing stress and depression.  Initial Review & Psychosocial Screening:  Initial Psych Review & Screening - 08/10/24 1316  Initial Review   Current issues with None Identified      Family Dynamics   Good Support System? Yes    Comments His support  system is his sister and his 2 grandsons.      Barriers   Psychosocial barriers to participate in program There are no identifiable barriers or psychosocial needs.      Screening Interventions   Interventions Encouraged to exercise    Expected Outcomes Long Term goal: The participant improves quality of Life and PHQ9 Scores as seen by post scores and/or verbalization of changes;Long Term Goal: Stressors or current issues are controlled or eliminated.;Short Term goal: Utilizing psychosocial counselor, staff and physician to assist with identification of specific Stressors or current issues interfering with healing process. Setting desired goal for each stressor or current issue identified.;Short Term goal: Identification and review with participant of any Quality of Life or Depression concerns found by scoring the questionnaire.          Quality of Life Scores:  Quality of Life - 08/15/24 1504       Quality of Life   Select Quality of Life      Quality of Life Scores   Health/Function Pre 22.71 %    Socioeconomic Pre 25.71 %    Psych/Spiritual Pre 27.43 %    Family Pre 30 %    GLOBAL Pre 25.31 %         Scores of 19 and below usually indicate a poorer quality of life in these areas.  A difference of  2-3 points is a clinically meaningful difference.  A difference of 2-3 points in the total score of the Quality of Life Index has been associated with significant improvement in overall quality of life, self-image, physical symptoms, and general health in studies assessing change in quality of life.  PHQ-9: Review Flowsheet       08/15/2024 02/13/2022 08/06/2018  Depression screen PHQ 2/9  Decreased Interest 0 0 0  Down, Depressed, Hopeless 0 0 0  PHQ - 2 Score 0 0 0  Altered sleeping 0 1 1  Tired, decreased energy 1 3 1   Change in appetite 0 0 0  Feeling bad or failure about yourself  0 0 0  Trouble concentrating 0 0 0  Moving slowly or fidgety/restless 0 0 0  Suicidal  thoughts 0 0 -  PHQ-9 Score 1 4  2    Difficult doing work/chores Somewhat difficult Somewhat difficult -    Details       Data saved with a previous flowsheet row definition        Interpretation of Total Score  Total Score Depression Severity:  1-4 = Minimal depression, 5-9 = Mild depression, 10-14 = Moderate depression, 15-19 = Moderately severe depression, 20-27 = Severe depression   Psychosocial Evaluation and Intervention:  Psychosocial Evaluation - 08/10/24 1317       Psychosocial Evaluation & Interventions   Interventions Encouraged to exercise with the program and follow exercise prescription    Comments Kazim is a pleasant 63 year old male who is coming into cardiac rehab with a NSTEMI diagnosis. He is currently not active with home exercise, but is ready to get back into it and feel better. He has his sister as a support system, and his two grandsons who are senior's in high school who stay with him most of the time and provide him company. His mobility is good, he states his left knee does bother him some from time to  time. He is disabled, lives home alone, and is seperated from his wife. He does have an ICD in place. He is eager to start the program.    Expected Outcomes Short: Increase strength and stamina. Long: Improve SOB.    Continue Psychosocial Services  Follow up required by staff          Psychosocial Re-Evaluation:  Psychosocial Re-Evaluation     Row Name 08/31/24 1004             Psychosocial Re-Evaluation   Current issues with None Identified       Comments Johnpaul is doing well in rehab. He stated that he does not have any stressors in his life other then just normal life stress. He also stated that his sleep is well. He is very interactive in class and talking to others .       Expected Outcomes Short: continue to have no stress   long: continue to exercise for lower stress level       Interventions Encouraged to attend Cardiac Rehabilitation for the  exercise       Continue Psychosocial Services  Follow up required by staff          Psychosocial Discharge (Final Psychosocial Re-Evaluation):  Psychosocial Re-Evaluation - 08/31/24 1004       Psychosocial Re-Evaluation   Current issues with None Identified    Comments Shaquon is doing well in rehab. He stated that he does not have any stressors in his life other then just normal life stress. He also stated that his sleep is well. He is very interactive in class and talking to others .    Expected Outcomes Short: continue to have no stress   long: continue to exercise for lower stress level    Interventions Encouraged to attend Cardiac Rehabilitation for the exercise    Continue Psychosocial Services  Follow up required by staff          Vocational Rehabilitation: Provide vocational rehab assistance to qualifying candidates.   Vocational Rehab Evaluation & Intervention:  Vocational Rehab - 08/10/24 1316       Initial Vocational Rehab Evaluation & Intervention   Assessment shows need for Vocational Rehabilitation No      Vocational Rehab Re-Evaulation   Comments He is disabled and not going back to work.          Education: Education Goals: Education classes will be provided on a weekly basis, covering required topics. Participant will state understanding/return demonstration of topics presented.  Learning Barriers/Preferences:  Learning Barriers/Preferences - 08/10/24 1316       Learning Barriers/Preferences   Learning Barriers None    Learning Preferences Group Instruction;Individual Instruction;Skilled Demonstration;Written Material          Education Topics: Hypertension, Hypertension Reduction -Define heart disease and high blood pressure. Discus how high blood pressure affects the body and ways to reduce high blood pressure. Flowsheet Row CARDIAC REHAB PHASE II EXERCISE from 08/04/2018 in [place] CARDIAC RE[facility]ITATION  Date 07/28/18  Educator D. Coad   Instruction Review Code 2- Demonstrated Understanding    Exercise and Your Heart -Discuss why it is important to exercise, the FITT principles of exercise, normal and abnormal responses to exercise, and how to exercise safely. Flowsheet Row CARDIAC REHAB PHASE II EXERCISE from 05/14/2022 in [place] CARDIAC REH[facility]ITATION  Date 04/02/22  Educator HJ  Instruction Review Code 1- Verbalizes Understanding    Angina -Discuss definition of angina, causes of angina, treatment of angina, and  how to decrease risk of having angina. Flowsheet Row CARDIAC REHAB PHASE II EXERCISE from 05/14/2022 in [place] CARDIAC RE[facility]ITATION  Date 04/09/22  Educator HJ  Instruction Review Code 1- Verbalizes Understanding    Cardiac Medications -Review what the following cardiac medications are used for, how they affect the body, and side effects that may occur when taking the medications.  Medications include Aspirin , Beta blockers, calcium channel blockers, ACE Inhibitors, angiotensin receptor blockers, diuretics, digoxin, and antihyperlipidemics. Flowsheet Row CARDIAC REHAB PHASE II EXERCISE from 05/14/2022 in [place] CARDIAC RE[facility]ITATION  Date 04/16/22  Educator DF  Instruction Review Code 1- Verbalizes Understanding    Congestive Heart Failure -Discuss the definition of CHF, how to live with CHF, the signs and symptoms of CHF, and how keep track of weight and sodium intake. Flowsheet Row CARDIAC REHAB PHASE II EXERCISE from 05/14/2022 in [place] CARDIAC RE[facility]ATION  Date 04/23/22  Educator pb  Instruction Review Code 1- Verbalizes Understanding    Heart Disease and Intimacy -Discus the effect sexual activity has on the heart, how changes occur during intimacy as we age, and safety during sexual activity. Flowsheet Row CARDIAC REHAB PHASE II EXERCISE from 05/14/2022 in [place] CARD[facility] RE[facility]ATION  Date 04/30/22  Educator pb  Instruction Review Code 1- Verbalizes Understanding     Smoking Cessation / COPD -Discuss different methods to quit smoking, the health benefits of quitting smoking, and the definition of COPD. Flowsheet Row CARDIAC REHAB PHASE II EXERCISE from 05/14/2022 in [place] [facility]  Date 05/07/22  Educator hj  Instruction Review Code 2- Demonstrated Understanding    Nutrition I: Fats -Discuss the types of cholesterol, what cholesterol does to the heart, and how cholesterol levels can be controlled. Flowsheet Row CARDIAC REHAB PHASE II EXERCISE from 05/14/2022 in [place] [facility]  Date 05/14/22  Educator DF  Instruction Review Code 2- Demonstrated Understanding    Nutrition II: Labels -Discuss the different components of food labels and how to read food label Flowsheet Row CARDIAC REHAB PHASE II EXERCISE from 08/04/2018 in [place] [facility]  Date 06/23/18  Educator CHARM Louder  Instruction Review Code 2- Demonstrated Understanding    Heart Parts/Heart Disease and PAD -Discuss the anatomy of the heart, the pathway of blood circulation through the heart, and these are affected by heart disease. Flowsheet Row CARDIAC REHAB PHASE II EXERCISE from 05/14/2022 in [place] [facility]  Date 03/05/22  Educator HJ  Instruction Review Code 2- Demonstrated Understanding    Stress I: Signs and Symptoms -Discuss the causes of stress, how stress may lead to anxiety and depression, and ways to limit stress. Flowsheet Row CARDIAC REHAB PHASE II EXERCISE from 05/14/2022 in [place] [facility]  Date 03/12/22  Educator hj  Instruction Review Code 2- Demonstrated Understanding    Stress II: Relaxation -Discuss different types of relaxation techniques to limit stress. Flowsheet Row CARDIAC REHAB PHASE II EXERCISE from 05/14/2022 in [place] [facility]  Date 03/19/22  Educator HJ  Instruction Review Code 1- Verbalizes Understanding    Warning Signs of Stroke /  TIA -Discuss definition of a stroke, what the signs and symptoms are of a stroke, and how to identify when someone is having stroke.   Knowledge Questionnaire Score:  Knowledge Questionnaire Score - 08/15/24 1431       Knowledge Questionnaire Score   Pre Score 24/26          Core Components/Risk Factors/Patient Goals at Admission:  Personal Goals  and Risk Factors at Admission - 08/10/24 1314       Core Components/Risk Factors/Patient Goals on Admission    Weight Management Yes;Weight Maintenance    Intervention Weight Management: Develop a combined nutrition and exercise program designed to reach desired caloric intake, while maintaining appropriate intake of nutrient and fiber, sodium and fats, and appropriate energy expenditure required for the weight goal.;Weight Management: Provide education and appropriate resources to help participant work on and attain dietary goals.;Weight Management/Obesity: Establish reasonable short term and long term weight goals.    Expected Outcomes Short Term: Continue to assess and modify interventions until short term weight is achieved;Long Term: Adherence to nutrition and physical activity/exercise program aimed toward attainment of established weight goal;Weight Maintenance: Understanding of the daily nutrition guidelines, which includes 25-35% calories from fat, 7% or less cal from saturated fats, less than 200mg  cholesterol, less than 1.5gm of sodium, & 5 or more servings of fruits and vegetables daily;Weight Loss: Understanding of general recommendations for a balanced deficit meal plan, which promotes 1-2 lb weight loss per week and includes a negative energy balance of [phone] kcal/d;Understanding recommendations for meals to include 15-35% energy as protein, 25-35% energy from fat, 35-60% energy from carbohydrates, less than 200mg  of dietary cholesterol, 20-35 gm of total fiber daily;Understanding of distribution of calorie intake throughout the day  with the consumption of 4-5 meals/snacks    Improve shortness of breath with ADL's Yes    Intervention Provide education, individualized exercise plan and daily activity instruction to help decrease symptoms of SOB with activities of daily living.    Expected Outcomes Short Term: Improve cardiorespiratory fitness to achieve a reduction of symptoms when performing ADLs;Long Term: Be able to perform more ADLs without symptoms or delay the onset of symptoms    Diabetes Yes    Intervention Provide education about signs/symptoms and action to take for hypo/hyperglycemia.;Provide education about proper nutrition, including hydration, and aerobic/resistive exercise prescription along with prescribed medications to achieve blood glucose in normal ranges: Fasting glucose 65-99 mg/dL    Expected Outcomes Short Term: Participant verbalizes understanding of the signs/symptoms and immediate care of hyper/hypoglycemia, proper foot care and importance of medication, aerobic/resistive exercise and nutrition plan for blood glucose control.;Long Term: Attainment of HbA1C < 7%.    Heart Failure Yes    Intervention Provide a combined exercise and nutrition program that is supplemented with education, support and counseling about heart failure. Directed toward relieving symptoms such as shortness of breath, decreased exercise tolerance, and extremity edema.    Expected Outcomes Improve functional capacity of life;Short term: Attendance in program 2-3 days a week with increased exercise capacity. Reported lower sodium intake. Reported increased fruit and vegetable intake. Reports medication compliance.;Short term: Daily weights obtained and reported for increase. Utilizing diuretic protocols set by physician.;Long term: Adoption of self-care skills and reduction of barriers for early signs and symptoms recognition and intervention leading to self-care maintenance.    Hypertension Yes    Intervention Provide education on  lifestyle modifcations including regular physical activity/exercise, weight management, moderate sodium restriction and increased consumption of fresh fruit, vegetables, and low fat dairy, alcohol moderation, and smoking cessation.;Monitor prescription use compliance.    Expected Outcomes Short Term: Continued assessment and intervention until BP is < 140/14mm HG in hypertensive participants. < 130/23mm HG in hypertensive participants with diabetes, heart failure or chronic kidney disease.;Long Term: Maintenance of blood pressure at goal levels.    Lipids Yes    Intervention Provide education and support for  participant on nutrition & aerobic/resistive exercise along with prescribed medications to achieve LDL 70mg , HDL >40mg .    Expected Outcomes Short Term: Participant states understanding of desired cholesterol values and is compliant with medications prescribed. Participant is following exercise prescription and nutrition guidelines.;Long Term: Cholesterol controlled with medications as prescribed, with individualized exercise RX and with personalized nutrition plan. Value goals: LDL < 70mg , HDL > 40 mg.          Core Components/Risk Factors/Patient Goals Review:   Goals and Risk Factor Review     Row Name 08/31/24 1009             Core Components/Risk Factors/Patient Goals Review   Personal Goals Review Weight Management/Obesity;Heart Failure;Hypertension       Review Norvil has been doing well in rehab. He is diabetic and has been good with controling that. He watches his sweets and also his portion control when eating. He take his medications daily and also weigh himself each morning. He is doing well in rehab and enjoys coming.       Expected Outcomes Short: continue to weigh youseld   long: continue to exerise          Core Components/Risk Factors/Patient Goals at Discharge (Final Review):   Goals and Risk Factor Review - 08/31/24 1009       Core Components/Risk Factors/Patient  Goals Review   Personal Goals Review Weight Management/Obesity;Heart Failure;Hypertension    Review Davanta has been doing well in rehab. He is diabetic and has been good with controling that. He watches his sweets and also his portion control when eating. He take his medications daily and also weigh himself each morning. He is doing well in rehab and enjoys coming.    Expected Outcomes Short: continue to weigh youseld   long: continue to exerise          ITP Comments:  ITP Comments     Row Name 08/10/24 1309 08/15/24 1508 08/17/24 0924 08/23/24 0934 09/20/24 1306   ITP Comments Completed virtual orientation today.  EP evaluation is scheduled for 08/15/24 at 2:15 pm.  Documentation for diagnosis can be found in [facility] encounter. Patient attend orientation today.  Patient is attending Cardiac Rehabilitation Program.  Documentation for diagnosis can be found in [facility].  Reviewed medical chart, RPE/RPD, gym safety, and program guidelines.  Patient was fitted to equipment they will be using during rehab.  Patient is scheduled to start exercise on 08/17/24.   Initial ITP created and sent for review and signature by Dr. Dorn Ross, Medical Director for Cardiac Rehabilitation Program. First full day of exercise!  Patient was oriented to gym and equipment including functions, settings, policies, and procedures.  Patient's individual exercise prescription and treatment plan were reviewed.  All starting workloads were established based on the results of the 6 minute walk test done at initial orientation visit.  The plan for exercise progression was also introduced and progression will be customized based on patient's performance and goals. 30 day review completed. ITP sent to Dr. Dorn Ross, Medical Director of Cardiac Rehab. Continue with ITP unless changes are made by physician.    New to program 30 day review completed. ITP sent to Dr. Dorn Ross, Medical Director of Cardiac Rehab. Continue with ITP  unless changes are made by physician.      Comments: 30 day review     [1]  Current Outpatient Medications:    allopurinol  (ZYLOPRIM ) 300 MG tablet, Take 300 mg by mouth daily., Disp: ,  Rfl:    aspirin  325 MG tablet, Take 325 mg by mouth daily., Disp: , Rfl:    finasteride (PROSCAR) 5 MG tablet, Take 5 mg by mouth daily., Disp: , Rfl:    fluticasone (FLONASE) 50 MCG/ACT nasal spray, Place 1 spray into both nostrils daily as needed for allergies., Disp: , Rfl:    glipiZIDE  (GLUCOTROL  XL) 10 MG 24 hr tablet, Take 10 mg by mouth daily.   (Patient not taking: Reported on 08/10/2024), Disp: , Rfl:    HYDROcodone -acetaminophen  (NORCO/VICODIN) 5-325 MG tablet, Take 1 tablet by mouth every 12 (twelve) hours as needed for moderate pain., Disp: , Rfl:    insulin  glargine (LANTUS) 100 UNIT/ML injection, Inject 24 Units into the skin daily., Disp: , Rfl:    magnesium oxide (MAG-OX) 400 MG tablet, Take 400 mg by mouth daily., Disp: , Rfl:    meclizine (ANTIVERT) 25 MG tablet, Take 1 tablet by mouth 3 (three) times daily as needed for dizziness., Disp: , Rfl:    metFORMIN  (GLUCOPHAGE ) 1000 MG tablet, Take 1,000 mg by mouth 2 (two) times daily with a meal., Disp: , Rfl:    metoprolol  succinate (TOPROL -XL) 50 MG 24 hr tablet, Take 50 mg by mouth daily., Disp: , Rfl:    Multiple Vitamin (MULTI-VITAMINS) TABS, Take 1 tablet daily by mouth., Disp: , Rfl:    nitroGLYCERIN  (NITROSTAT ) 0.4 MG SL tablet, Place 0.4 mg under the tongue. (Patient not taking: Reported on 08/10/2024), Disp: , Rfl:    omega-3 acid ethyl esters (LOVAZA ) 1 g capsule, Take 1 g daily by mouth., Disp: , Rfl:    Polyethyl Glycol-Propyl Glycol (SYSTANE) 0.4-0.3 % SOLN, Place 1 application. into both eyes daily as needed (Dry eyes)., Disp: , Rfl:    rosuvastatin (CRESTOR) 40 MG tablet, Take 40 mg by mouth daily., Disp: , Rfl:    sacubitril-valsartan (ENTRESTO) 24-26 MG, Take 1 tablet by mouth 2 (two) times daily., Disp: , Rfl:     senna-docusate (SENOKOT-S) 8.6-50 MG tablet, Take 2 tablets by mouth 2 (two) times daily as needed for mild constipation or moderate constipation., Disp: , Rfl:    spironolactone  (ALDACTONE ) 25 MG tablet, Take 12.5 mg daily by mouth., Disp: , Rfl:    tadalafil (CIALIS) 5 MG tablet, Take 5 mg by mouth daily., Disp: , Rfl:    tamsulosin  (FLOMAX ) 0.4 MG CAPS capsule, Take 0.4 mg by mouth daily., Disp: , Rfl:    torsemide (DEMADEX) 20 MG tablet, Take 60 mg by mouth daily., Disp: , Rfl:  [2]  Social History Tobacco Use  Smoking Status Former   Current packs/day: 0.00   Average packs/day: 1 pack/day for 5.0 years (5.0 ttl pk-yrs)   Types: Cigarettes   Start date: 09/22/1984   Quit date: 09/22/1989   Years since quitting: 35.0  Smokeless Tobacco Former   Types: Snuff   Quit date: 09/22/1989  Tobacco Comments   dipped snuff for one year

## 2024-09-21 ENCOUNTER — Encounter (HOSPITAL_COMMUNITY)
Admission: RE | Admit: 2024-09-21 | Discharge: 2024-09-21 | Disposition: A | Discharge status: Home / Self Care | Source: Ambulatory Visit | Attending: Internal Medicine | Admitting: Internal Medicine

## 2024-09-21 DIAGNOSIS — I214 Non-ST elevation (NSTEMI) myocardial infarction: Secondary | ICD-10-CM | POA: Diagnosis not present

## 2024-09-21 LAB — GLUCOSE, CAPILLARY: Glucose-Capillary: 135 mg/dL — ABNORMAL HIGH (ref 70–99)

## 2024-09-21 NOTE — Progress Notes (Signed)
 Daily Session Note  Patient Details  Name: Michael Vaughn MRN: [id number] Date of Birth: September 13, 1961 Referring Provider:   [name] from 08/15/2024 in [facility]  Referring Provider Irven Alberta MD    Encounter Date: 09/21/2024  Check In:  Session Check In - 09/21/24 9076       Check-In   Supervising physician immediately available to respond to emergencies See telemetry face sheet for immediately available MD    Location [facility]    Staff Present Powell Benders, BS, Exercise Physiologist;Brittany Jackquline, BSN, RN, Rosalba Gelineau, MA, RCEP, CCRP, CCET    Virtual Visit No    Medication changes reported     No    Fall or balance concerns reported    No    Tobacco Cessation No Change    Warm-up and Cool-down Performed on first and last piece of equipment    Resistance Training Performed Yes    VAD Patient? No    PAD/SET Patient? No      Pain Assessment   Currently in Pain? No/denies    Multiple Pain Sites No          Capillary Blood Glucose: No results found for this or any previous visit (from the past 24 hours).    Tobacco Use History[1]  Goals Met:  Independence with exercise equipment Exercise tolerated well No report of concerns or symptoms today Strength training completed today  Goals Unmet:  Not Applicable  Comments: Pt able to follow exercise prescription today without complaint.  Will continue to monitor for progression.        [1]  Social History Tobacco Use  Smoking Status Former   Current packs/day: 0.00   Average packs/day: 1 pack/day for 5.0 years (5.0 ttl pk-yrs)   Types: Cigarettes   Start date: 09/22/1984   Quit date: 09/22/1989   Years since quitting: 35.0  Smokeless Tobacco Former   Types: Snuff   Quit date: 09/22/1989  Tobacco Comments   dipped snuff for one year

## 2024-09-23 ENCOUNTER — Encounter (HOSPITAL_COMMUNITY)
Admission: RE | Admit: 2024-09-23 | Discharge: 2024-09-23 | Disposition: A | Discharge status: Home / Self Care | Source: Ambulatory Visit | Attending: Internal Medicine | Admitting: Internal Medicine

## 2024-09-23 DIAGNOSIS — I214 Non-ST elevation (NSTEMI) myocardial infarction: Secondary | ICD-10-CM | POA: Insufficient documentation

## 2024-09-23 NOTE — Progress Notes (Signed)
 Daily Session Note  Patient Details  Name: Michael Vaughn MRN: [id number] Date of Birth: [birth year] Referring Provider:   [name] from 08/15/2024 in [facility] CARDIAC REHABILIT[facility]  Referring Provider Irven Alberta MD    Encounter Date: 09/23/2024  Check In:  Session Check In - 09/23/24 9078       Check-In   Supervising physician immediately available to respond to emergencies See telemetry face sheet for immediately available MD    Location [facility]    Staff Present Laymon Rattler, BSN, RN, WTA-C;Heather Con, BS, Exercise Physiologist    Virtual Visit No    Medication changes reported     No    Fall or balance concerns reported    No    Tobacco Cessation No Change    Warm-up and Cool-down Performed on first and last piece of equipment    Resistance Training Performed Yes    VAD Patient? No    PAD/SET Patient? No      Pain Assessment   Currently in Pain? No/denies          Capillary Blood Glucose: No results found for this or any previous visit (from the past 24 hours).    Tobacco Use History[1]  Goals Met:  Independence with exercise equipment Exercise tolerated well No report of concerns or symptoms today Strength training completed today  Goals Unmet:  Not Applicable  Comments: Pt able to follow exercise prescription today without complaint.  Will continue to monitor for progression.        [1]  Social History Tobacco Use  Smoking Status Former   Current packs/day: 0.00   Average packs/day: 1 pack/day for 5.0 years (5.0 ttl pk-yrs)   Types: Cigarettes   Start date: 09/22/1984   Quit date: 09/22/1989   Years since quitting: 35.0  Smokeless Tobacco Former   Types: Snuff   Quit date: 09/22/1989  Tobacco Comments   dipped snuff for one year

## 2024-09-26 ENCOUNTER — Encounter (HOSPITAL_COMMUNITY)
Admission: RE | Admit: 2024-09-26 | Discharge: 2024-09-26 | Disposition: A | Discharge status: Home / Self Care | Source: Ambulatory Visit | Attending: Internal Medicine | Admitting: Internal Medicine

## 2024-09-26 DIAGNOSIS — I214 Non-ST elevation (NSTEMI) myocardial infarction: Secondary | ICD-10-CM

## 2024-09-26 NOTE — Progress Notes (Signed)
 Daily Session Note  Patient Details  Name: Michael Vaughn MRN: [id number] Date of Birth: May 23, 1961 Referring Provider:   [name] from 08/15/2024 in [facility] CARDIAC REHABILITATION  Referring Provider Irven Alberta MD    Encounter Date: 09/26/2024  Check In:  Session Check In - 09/26/24 0930       Check-In   Supervising physician immediately available to respond to emergencies See telemetry face sheet for immediately available MD    Location [facility]    Staff Present Powell Benders, BS, Exercise Physiologist;Brittany Jackquline, BSN, RN, Rosalba Gelineau, MA, RCEP, CCRP, CCET    Virtual Visit No    Medication changes reported     No    Fall or balance concerns reported    No    Tobacco Cessation No Change    Warm-up and Cool-down Performed on first and last piece of equipment    Resistance Training Performed Yes    VAD Patient? No    PAD/SET Patient? No      Pain Assessment   Currently in Pain? No/denies    Multiple Pain Sites No          Capillary Blood Glucose: No results found for this or any previous visit (from the past 24 hours).    Tobacco Use History[1]  Goals Met:  Independence with exercise equipment Exercise tolerated well No report of concerns or symptoms today Strength training completed today  Goals Unmet:  Not Applicable  Comments: Pt able to follow exercise prescription today without complaint.  Will continue to monitor for progression.        [1]  Social History Tobacco Use  Smoking Status Former   Current packs/day: 0.00   Average packs/day: 1 pack/day for 5.0 years (5.0 ttl pk-yrs)   Types: Cigarettes   Start date: 09/22/1984   Quit date: 09/22/1989   Years since quitting: 35.0  Smokeless Tobacco Former   Types: Snuff   Quit date: 09/22/1989  Tobacco Comments   dipped snuff for one year

## 2024-09-28 ENCOUNTER — Ambulatory Visit (HOSPITAL_COMMUNITY)

## 2024-09-30 ENCOUNTER — Encounter (HOSPITAL_COMMUNITY)
Admission: RE | Admit: 2024-09-30 | Discharge: 2024-09-30 | Disposition: A | Source: Ambulatory Visit | Attending: Internal Medicine

## 2024-09-30 DIAGNOSIS — I214 Non-ST elevation (NSTEMI) myocardial infarction: Secondary | ICD-10-CM

## 2024-09-30 NOTE — Progress Notes (Signed)
 Daily Session Note  Patient Details  Name: Michael Vaughn MRN: [id number] Date of Birth: 1960-12-02 Referring Provider:   [name] from 08/15/2024 in [facility] CARDIAC RE[facility]  Referring Provider Irven Alberta MD    Encounter Date: 09/30/2024  Check In:  Session Check In - 09/30/24 9076       Check-In   Supervising physician immediately available to respond to emergencies See telemetry face sheet for immediately available MD    Location [facility]    Staff Present Powell Benders, BS, Exercise Physiologist;Jessica Vonzell, MA, RCEP, CCRP, CCET    Virtual Visit No    Medication changes reported     No    Fall or balance concerns reported    No    Tobacco Cessation No Change    Warm-up and Cool-down Performed on first and last piece of equipment    Resistance Training Performed Yes    VAD Patient? No    PAD/SET Patient? No      Pain Assessment   Currently in Pain? No/denies    Multiple Pain Sites No          Capillary Blood Glucose: No results found for this or any previous visit (from the past 24 hours).    Tobacco Use History[1]  Goals Met:  Independence with exercise equipment Exercise tolerated well No report of concerns or symptoms today Strength training completed today  Goals Unmet:  Not Applicable  Comments: Pt able to follow exercise prescription today without complaint.  Will continue to monitor for progression.        [1]  Social History Tobacco Use  Smoking Status Former   Current packs/day: 0.00   Average packs/day: 1 pack/day for 5.0 years (5.0 ttl pk-yrs)   Types: Cigarettes   Start date: 09/22/1984   Quit date: 09/22/1989   Years since quitting: 35.0  Smokeless Tobacco Former   Types: Snuff   Quit date: 09/22/1989  Tobacco Comments   dipped snuff for one year

## 2024-10-03 ENCOUNTER — Encounter (HOSPITAL_COMMUNITY)
Admission: RE | Admit: 2024-10-03 | Discharge: 2024-10-03 | Disposition: A | Source: Ambulatory Visit | Attending: Internal Medicine

## 2024-10-03 DIAGNOSIS — I214 Non-ST elevation (NSTEMI) myocardial infarction: Secondary | ICD-10-CM

## 2024-10-03 NOTE — Progress Notes (Signed)
 Daily Session Note  Patient Details  Name: Michael Vaughn MRN: [id number] Date of Birth: 06-Jan-1961 Referring Provider:   [name] from 08/15/2024 in [facility] CARDIAC REHABILITATION  Referring Provider Irven Alberta MD    Encounter Date: 10/03/2024  Check In:  Session Check In - 10/03/24 0929       Check-In   Location [facility]    Staff Present Powell Benders, BS, Exercise Physiologist;Brittany Jackquline, BSN, RN, Rosalba Gelineau, MA, RCEP, CCRP, CCET    Virtual Visit No    Medication changes reported     No    Fall or balance concerns reported    No    Tobacco Cessation No Change    Warm-up and Cool-down Performed on first and last piece of equipment    Resistance Training Performed Yes    VAD Patient? No    PAD/SET Patient? No      Pain Assessment   Currently in Pain? No/denies    Multiple Pain Sites No          Capillary Blood Glucose: No results found for this or any previous visit (from the past 24 hours).    Tobacco Use History[1]  Goals Met:  Independence with exercise equipment Exercise tolerated well No report of concerns or symptoms today Strength training completed today  Goals Unmet:  Not Applicable  Comments: Pt able to follow exercise prescription today without complaint.  Will continue to monitor for progression.        [1]  Social History Tobacco Use  Smoking Status Former   Current packs/day: 0.00   Average packs/day: 1 pack/day for 5.0 years (5.0 ttl pk-yrs)   Types: Cigarettes   Start date: 09/22/1984   Quit date: 09/22/1989   Years since quitting: 35.0  Smokeless Tobacco Former   Types: Snuff   Quit date: 09/22/1989  Tobacco Comments   dipped snuff for one year

## 2024-10-05 ENCOUNTER — Encounter (HOSPITAL_COMMUNITY)
Admission: RE | Admit: 2024-10-05 | Discharge: 2024-10-05 | Disposition: A | Discharge status: Home / Self Care | Source: Ambulatory Visit | Attending: Internal Medicine | Admitting: Internal Medicine

## 2024-10-05 DIAGNOSIS — I214 Non-ST elevation (NSTEMI) myocardial infarction: Secondary | ICD-10-CM

## 2024-10-05 NOTE — Progress Notes (Signed)
 Daily Session Note  Patient Details  Name: Michael Vaughn MRN: [id number] Date of Birth: 1960-11-13 Referring Provider:   [name] from 08/15/2024 in [facility] CARDIAC REHABILITATION  Referring Provider Irven Alberta MD    Encounter Date: 10/05/2024  Check In:  Session Check In - 10/05/24 0915       Check-In   Supervising physician immediately available to respond to emergencies See telemetry face sheet for immediately available MD    Location [facility]    Staff Present Adrien Louder, RN, BSN;Heather Con, BS, Exercise Physiologist;Brittany Jackquline, BSN, RN, WTA-C    Virtual Visit No    Medication changes reported     No    Fall or balance concerns reported    No    Warm-up and Cool-down Performed on first and last piece of equipment    Resistance Training Performed Yes    VAD Patient? No    PAD/SET Patient? No      Pain Assessment   Currently in Pain? No/denies    Multiple Pain Sites No          Capillary Blood Glucose: No results found for this or any previous visit (from the past 24 hours).    Tobacco Use History[1]  Goals Met:  Independence with exercise equipment Exercise tolerated well No report of concerns or symptoms today Strength training completed today  Goals Unmet:  Not Applicable  Comments: Pt able to follow exercise prescription today without complaint.  Will continue to monitor for progression.        [1]  Social History Tobacco Use  Smoking Status Former   Current packs/day: 0.00   Average packs/day: 1 pack/day for 5.0 years (5.0 ttl pk-yrs)   Types: Cigarettes   Start date: 09/22/1984   Quit date: 09/22/1989   Years since quitting: 35.0  Smokeless Tobacco Former   Types: Snuff   Quit date: 09/22/1989  Tobacco Comments   dipped snuff for one year

## 2024-10-07 ENCOUNTER — Encounter (HOSPITAL_COMMUNITY)
Admission: RE | Admit: 2024-10-07 | Discharge: 2024-10-07 | Disposition: A | Discharge status: Home / Self Care | Source: Ambulatory Visit | Attending: Internal Medicine | Admitting: Internal Medicine

## 2024-10-07 DIAGNOSIS — I214 Non-ST elevation (NSTEMI) myocardial infarction: Secondary | ICD-10-CM

## 2024-10-07 NOTE — Progress Notes (Signed)
 Daily Session Note  Patient Details  Name: Michael Vaughn MRN: [id number] Date of Birth: 04-29-1961 Referring Provider:   [name] from 08/15/2024 in [facility] CARDIAC REHABILITATION  Referring Provider Irven Alberta MD    Encounter Date: 10/07/2024  Check In:  Session Check In - 10/07/24 0924       Check-In   Supervising physician immediately available to respond to emergencies See telemetry face sheet for immediately available MD    Location [facility]    Staff Present Laymon Rattler, BSN, RN, Rosalba Gelineau, MA, RCEP, CCRP, CCET;Victoria Winfield, RN    Virtual Visit No    Medication changes reported     No    Fall or balance concerns reported    No    Tobacco Cessation No Change    Warm-up and Cool-down Performed on first and last piece of equipment    Resistance Training Performed Yes    VAD Patient? No    PAD/SET Patient? No      Pain Assessment   Currently in Pain? No/denies          Capillary Blood Glucose: No results found for this or any previous visit (from the past 24 hours).    Tobacco Use History[1]  Goals Met:  Independence with exercise equipment Exercise tolerated well No report of concerns or symptoms today Strength training completed today  Goals Unmet:  Not Applicable  Comments: Pt able to follow exercise prescription today without complaint.  Will continue to monitor for progression.        [1]  Social History Tobacco Use  Smoking Status Former   Current packs/day: 0.00   Average packs/day: 1 pack/day for 5.0 years (5.0 ttl pk-yrs)   Types: Cigarettes   Start date: 09/22/1984   Quit date: 09/22/1989   Years since quitting: 35.0  Smokeless Tobacco Former   Types: Snuff   Quit date: 09/22/1989  Tobacco Comments   dipped snuff for one year

## 2024-10-10 ENCOUNTER — Encounter (HOSPITAL_COMMUNITY)

## 2024-10-12 ENCOUNTER — Ambulatory Visit (HOSPITAL_COMMUNITY)

## 2024-10-14 ENCOUNTER — Encounter (HOSPITAL_COMMUNITY)

## 2024-10-14 ENCOUNTER — Encounter (HOSPITAL_COMMUNITY): Payer: Self-pay

## 2024-10-14 NOTE — Progress Notes (Signed)
 [facility] called us  to let us  know that Michael Vaughn sessions needs to be on hold for a bit. Pt was worried he was going to lose his spot, so the clinic called us  personally. Informed that we will put him on medical hold for a few weeks until we hear back from them.

## 2024-10-17 ENCOUNTER — Ambulatory Visit (HOSPITAL_COMMUNITY)

## 2024-10-18 ENCOUNTER — Encounter (HOSPITAL_COMMUNITY): Payer: Self-pay | Admitting: *Deleted

## 2024-10-18 DIAGNOSIS — I214 Non-ST elevation (NSTEMI) myocardial infarction: Secondary | ICD-10-CM

## 2024-10-18 NOTE — Progress Notes (Signed)
 Cardiac Individual Treatment Plan  Patient Details  Name: Michael Vaughn MRN: [id number] Date of Birth: 04/23/1961 Referring Provider:   [name] from 08/15/2024 in [facility] CARDIAC REHABILITATION  Referring Provider Irven Alberta MD    Initial Encounter Date:  [name] from 08/15/2024 in [place] CARDIAC REHABILITATION  Date 08/15/24    Visit Diagnosis: NSTEMI (non-ST elevated myocardial infarction) [facility])  Patient's Home Medications on Admission: Current Medications[1]  Past Medical History: Past Medical History:  Diagnosis Date   Anginal pain    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   Diabetes mellitus    Dyslipidemia    Dysrhythmia    Ejection fraction    Improved from the past  /  ejection fraction 50%, echo, July, 2012, hypokinesis at the base of the inferior wall.   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         Capillary Blood Glucose: Lab Results  Component Value Date   GLUCAP 135 (H) 09/21/2024   GLUCAP 125 (H) 08/26/2024   GLUCAP 146 (H) 08/24/2024   GLUCAP 144 (H) 08/24/2024   GLUCAP 134 (H) 08/22/2024     Exercise Target Goals: Exercise Program Goal: Individual exercise prescription set using results from initial 6 min walk test and THRR while considering  patients activity barriers and safety.   Exercise Prescription Goal: Starting with aerobic activity 30 plus minutes a day, 3 days per week for initial exercise prescription. Provide home exercise prescription and guidelines that participant acknowledges understanding prior to discharge.  Activity Barriers & Risk Stratification:  Activity Barriers & Cardiac Risk Stratification - 08/10/24 1301       Activity Barriers & Cardiac Risk Stratification   Activity Barriers Muscular Weakness;Deconditioning;Chest Pain/Angina;Shortness of Breath    Cardiac Risk Stratification High          6 Minute Walk:  6 Minute Walk     Row Name 08/15/24 1457         6 Minute Walk   Phase Initial     Distance 1400 feet     Walk Time 6 minutes     # of Rest Breaks 0     MPH 2.65     METS 2.2     RPE 12  Perceived Dyspnea  0     VO2 Peak 11.6     Symptoms No     Resting HR 76 bpm     Resting BP 106/70     Resting Oxygen  Saturation  99 %     Exercise Oxygen  Saturation  during 6 min walk 99 %     Max Ex. HR 136 bpm     Max Ex. BP 128/60     2 Minute Post BP 110/64        Oxygen  Initial Assessment:   Oxygen  Re-Evaluation:   Oxygen  Discharge (Final Oxygen  Re-Evaluation):   Initial Exercise Prescription:  Initial Exercise Prescription - 08/15/24 1400       Date of Initial Exercise RX and Referring Provider   Date 08/15/24    Referring Provider Irven Alberta MD      Treadmill   MPH 1.5    Grade 0    Minutes 15    METs 2.15      NuStep   Level 1    SPM 50    Minutes 15    METs 1.9       Prescription Details   Frequency (times per week) 3    Duration Progress to 30 minutes of continuous aerobic without signs/symptoms of physical distress      Intensity   THRR 40-80% of Max Heartrate 141-108    Ratings of Perceived Exertion 11-13    Perceived Dyspnea 0-4      Resistance Training   Training Prescription Yes    Weight 4.    Reps 10-15          Perform Capillary Blood Glucose checks as needed.  Exercise Prescription Changes:   Exercise Prescription Changes     Row Name 08/15/24 1500 09/05/24 1500 09/26/24 1500         Response to Exercise   Blood Pressure (Admit) 106/70 100/60 108/70     Blood Pressure (Exercise) 128/60 118/60 --     Blood Pressure (Exit) 110/64 102/60 120/80     Heart Rate (Admit) 76 bpm 114 bpm 100 bpm     Heart Rate (Exercise) 136 bpm 121 bpm 123 bpm     Heart Rate (Exit) 84 bpm 99 bpm 106 bpm     Oxygen  Saturation (Admit) 99 % -- --     Oxygen  Saturation (Exercise) 99 % -- --     Oxygen  Saturation (Exit) 99 % -- --     Rating of Perceived Exertion (Exercise) 12 11 13      Perceived Dyspnea (Exercise) 0 -- --     Duration -- Continue with 30 min of aerobic exercise without signs/symptoms of physical distress. Continue with 30 min of aerobic exercise without signs/symptoms of physical distress.     Intensity -- THRR unchanged THRR unchanged       Progression   Progression -- Continue to progress workloads to maintain intensity without signs/symptoms of physical distress. Continue to progress workloads to maintain intensity without signs/symptoms of physical distress.       Resistance Training   Weight -- 4 7     Reps -- 10-15 10-15       Treadmill   MPH -- 2.4 2.4     Grade -- 1 1     Minutes -- 15 15     METs -- 3.17 3.17       NuStep   Level -- 6 5     SPM -- 102 115  Minutes -- 15 15     METs -- 3.3 4.2        Exercise Comments:   Exercise Comments     Row Name 08/10/24 1310 08/15/24 1508 08/17/24 0924        Exercise Comments Michael Vaughn states he is not doing much exercise right now. He was an active member at the [facility] recently before all the heart events. Patient attend orientation today.  Patient is attending Cardiac Rehabilitation Program.  Documentation for diagnosis can be found in [facility].  Reviewed medical chart, RPE/RPD, gym safety, and program guidelines.  Patient was fitted to equipment they will be using during rehab.  Patient is scheduled to start exercise on 08/17/24.   Initial ITP created and sent for review and signature by Dr. Dorn Ross, Medical Director for Cardiac Rehabilitation Program. First full day of exercise!  Patient was oriented to gym and equipment including functions, settings, policies, and procedures.  Patient's individual exercise prescription and treatment plan were reviewed.  All starting workloads were established based on the results of the 6 minute walk test done at initial orientation visit.  The plan for exercise progression was also introduced and progression will be customized based on patient's performance and goals.        Exercise Goals and Review:   Exercise Goals     Row Name 08/10/24 1313             Exercise Goals   Increase Physical Activity Yes       Intervention Develop an individualized exercise prescription for aerobic and resistive training based on initial evaluation findings, risk stratification, comorbidities and participant's personal goals.;Provide advice, education, support and counseling about physical activity/exercise needs.       Expected Outcomes Short Term: Attend rehab on a regular basis to increase amount of physical activity.;Long Term: Exercising regularly at least 3-5 days a week.;Long Term: Add in home exercise to make exercise part of routine and to increase amount of physical activity.       Increase Strength and Stamina Yes       Intervention Provide advice, education, support and counseling about physical activity/exercise  needs.;Develop an individualized exercise prescription for aerobic and resistive training based on initial evaluation findings, risk stratification, comorbidities and participant's personal goals.       Expected Outcomes Short Term: Perform resistance training exercises routinely during rehab and add in resistance training at home;Short Term: Increase workloads from initial exercise prescription for resistance, speed, and METs.;Long Term: Improve cardiorespiratory fitness, muscular endurance and strength as measured by increased METs and functional capacity ( )       Able to understand and use rate of perceived exertion (RPE) scale Yes       Intervention Provide education and explanation on how to use RPE scale       Expected Outcomes Short Term: Able to use RPE daily in rehab to express subjective intensity level;Long Term:  Able to use RPE to guide intensity level when exercising independently       Knowledge and understanding of Target Heart Rate Range (THRR) Yes       Intervention Provide education and explanation of THRR including how the numbers were predicted and where they are located for reference       Expected Outcomes Short Term: Able to state/look up THRR;Long Term: Able to use THRR to govern intensity when exercising independently;Short Term: Able to use daily as guideline for intensity in rehab  Able to check pulse independently Yes       Intervention Provide education and demonstration on how to check pulse in carotid and radial arteries.;Review the importance of being able to check your own pulse for safety during independent exercise       Expected Outcomes Short Term: Able to explain why pulse checking is important during independent exercise;Long Term: Able to check pulse independently and accurately       Understanding of Exercise Prescription Yes       Intervention Provide education, explanation, and written materials on patient's individual exercise prescription        Expected Outcomes Short Term: Able to explain program exercise prescription;Long Term: Able to explain home exercise prescription to exercise independently          Exercise Goals Re-Evaluation :  Exercise Goals Re-Evaluation     Row Name 08/17/24 0924 08/31/24 1003 09/06/24 0848 09/26/24 1000       Exercise Goal Re-Evaluation   Exercise Goals Review Increase Physical Activity;Increase Strength and Stamina;Able to understand and use Dyspnea scale;Able to understand and use rate of perceived exertion (RPE) scale;Knowledge and understanding of Target Heart Rate Range (THRR);Able to check pulse independently;Understanding of Exercise Prescription Increase Physical Activity;Increase Strength and Stamina;Understanding of Exercise Prescription Increase Physical Activity;Understanding of Exercise Prescription Increase Strength and Stamina;Increase Physical Activity;Understanding of Exercise Prescription    Comments Reviewed RPE and dyspnea scale, THR and program prescription with pt today.  Pt voiced understanding and was given a copy of goals to take home. Michael Vaughn is doing well in rehab. He is already increasing his levels on the nustep from level 3 to level 6. He is exercising some at home when he can. He enjoys coming to class Michael Vaughn is doing well in rehab and has completed 9 sessions of CR. He is inceasing his levels on the nustep and also increasing him walking speed on the treadmill. Will continue to monitornand progress as able Michael Vaughn is doing well in rehab. He does not walk much outside of reheb. He stated taht he does feel like he is feeling better and not getting as winded when walking. He did say that he has noticed his kness feeling weak some days and we will keep an eye on that    Expected Outcomes Short: Use RPE daily to regulate intensity.  Long: Follow program prescription in THR. Shirt: continue to attend rehab   long: continue to exercise at home Short: continue to attend rehab   long: add  exercise outside of rehab Short: continue to attend rehab   long: add exercise outside of rehab        Discharge Exercise Prescription (Final Exercise Prescription Changes):  Exercise Prescription Changes - 09/26/24 1500       Response to Exercise   Blood Pressure (Admit) 108/70    Blood Pressure (Exit) 120/80    Heart Rate (Admit) 100 bpm    Heart Rate (Exercise) 123 bpm    Heart Rate (Exit) 106 bpm    Rating of Perceived Exertion (Exercise) 13    Duration Continue with 30 min of aerobic exercise without signs/symptoms of physical distress.    Intensity THRR unchanged      Progression   Progression Continue to progress workloads to maintain intensity without signs/symptoms of physical distress.      Resistance Training   Weight 7    Reps 10-15      Treadmill   MPH 2.4    Grade 1  Minutes 15    METs 3.17      NuStep   Level 5    SPM 115    Minutes 15    METs 4.2          Nutrition:  Target Goals: Understanding of nutrition guidelines, daily intake of sodium 1500mg , cholesterol 200mg , calories 30% from fat and 7% or less from saturated fats, daily to have 5 or more servings of fruits and vegetables.  Biometrics:  Pre Biometrics - 08/15/24 1501       Pre Biometrics   Height 5' 10.5 (1.791 m)    Weight 251 lb 5.2 oz (114 kg)    Waist Circumference 47 inches    Hip Circumference 44.5 inches    Waist to Hip Ratio 1.06 %    BMI (Calculated) 35.54    Grip Strength 25.1 kg    Single Leg Stand 30 seconds           Nutrition Therapy Plan and Nutrition Goals:  Nutrition Therapy & Goals - 08/10/24 1316       Intervention Plan   Intervention Prescribe, educate and counsel regarding individualized specific dietary modifications aiming towards targeted core components such as weight, hypertension, lipid management, diabetes, heart failure and other comorbidities.;Nutrition handout(s) given to patient.    Expected Outcomes Long Term Goal: Adherence to  prescribed nutrition plan.;Short Term Goal: A plan has been developed with personal nutrition goals set during dietitian appointment.;Short Term Goal: Understand basic principles of dietary content, such as calories, fat, sodium, cholesterol and nutrients.          Nutrition Assessments:  Nutrition Assessments - 08/15/24 1434       MEDFICTS Scores   Pre Score 72         MEDIFICTS Score Key: >=70 Need to make dietary changes  40-70 Heart Healthy Diet <= 40 Therapeutic Level Cholesterol Diet  [name] from 08/15/2024 in [facility] CARDIAC REHABILITATION  Picture Your Plate Total Score on Admission 72   Picture Your Plate Scores: <59 Unhealthy dietary pattern with much room for improvement. 41-50 Dietary pattern unlikely to meet recommendations for good health and room for improvement. 51-60 More healthful dietary pattern, with some room for improvement.  >60 Healthy dietary pattern, although there may be some specific behaviors that could be improved.    Nutrition Goals Re-Evaluation:  Nutrition Goals Re-Evaluation     Row Name 08/31/24 1007 09/26/24 1008           Goals   Nutrition Goal Healthy eating Healthy eating      Comment Michael Vaughn is doing well in rehab. He stated taht he does watch what he eats and does eat smaller portions. He is on a water limit but does drink what he is allowed throughtout the day.He is diabetic so he keeps track of his sweets. He is doing well Michael Vaughn is doing well in rehab. He has been rying to stick to his healthy diet. He eats a good amount of chicken. He does not eat much fish but is going to try to eat some salmon. He continues  to keep track of his sugar due to his diabeties. He drinks mostly water but is also on a limit      Expected Outcome Short: continue to monitor sweets    long: continue with portion control and healthy choices Short: continue to monitor sweets and add in fish to diet    long:  continue with portion control  and healthy choices         Nutrition Goals Discharge (Final Nutrition Goals Re-Evaluation):  Nutrition Goals Re-Evaluation - 09/26/24 1008       Goals   Nutrition Goal Healthy eating    Comment Michael Vaughn is doing well in rehab. He has been rying to stick to his healthy diet. He eats a good amount of chicken. He does not eat much fish but is going to try to eat some salmon. He continues  to keep track of his sugar due to his diabeties. He drinks mostly water but is also on a limit    Expected Outcome Short: continue to monitor sweets and add in fish to diet    long: continue with portion control and healthy choices          Psychosocial: Target Goals: Acknowledge presence or absence of significant depression and/or stress, maximize coping skills, provide positive support system. Participant is able to verbalize types and ability to use techniques and skills needed for reducing stress and depression.  Initial Review & Psychosocial Screening:  Initial Psych Review & Screening - 08/10/24 1316       Initial Review   Current issues with None Identified      Family Dynamics   Good Support System? Yes    Comments His support system is his sister and his 2 grandsons.      Barriers   Psychosocial barriers to participate in program There are no identifiable barriers or psychosocial needs.      Screening Interventions   Interventions Encouraged to exercise    Expected Outcomes Long Term goal: The participant improves quality of Life and PHQ9 Scores as seen by post scores and/or verbalization of changes;Long Term Goal: Stressors or current issues are controlled or eliminated.;Short Term goal: Utilizing psychosocial counselor, staff and physician to assist with identification of specific Stressors or current issues interfering with healing process. Setting desired goal for each stressor or current issue identified.;Short Term goal: Identification and review with  participant of any Quality of Life or Depression concerns found by scoring the questionnaire.          Quality of Life Scores:  Quality of Life - 08/15/24 1504       Quality of Life   Select Quality of Life      Quality of Life Scores   Health/Function Pre 22.71 %    Socioeconomic Pre 25.71 %    Psych/Spiritual Pre 27.43 %    Family Pre 30 %    GLOBAL Pre 25.31 %         Scores of 19 and below usually indicate a poorer quality of life in these areas.  A difference of  2-3 points is a clinically meaningful difference.  A difference of 2-3 points in the total score of the Quality of Life Index has been associated with significant improvement in overall quality of life, self-image, physical symptoms, and general health in studies assessing change in quality of life.  PHQ-9: Review Flowsheet       08/15/2024 02/13/2022 08/06/2018  Depression screen PHQ 2/9  Decreased Interest 0 0 0  Down, Depressed, Hopeless 0 0 0  PHQ - 2 Score 0 0 0  Altered sleeping 0 1 1  Tired, decreased energy 1 3 1   Change in appetite 0 0 0  Feeling bad or failure about yourself  0 0 0  Trouble concentrating 0 0 0  Moving slowly or fidgety/restless 0 0 0  Suicidal thoughts 0  0 -  PHQ-9 Score 1 4  2    Difficult doing work/chores Somewhat difficult Somewhat difficult -    Details       Data saved with a previous flowsheet row definition        Interpretation of Total Score  Total Score Depression Severity:  1-4 = Minimal depression, 5-9 = Mild depression, 10-14 = Moderate depression, 15-19 = Moderately severe depression, 20-27 = Severe depression   Psychosocial Evaluation and Intervention:  Psychosocial Evaluation - 08/10/24 1317       Psychosocial Evaluation & Interventions   Interventions Encouraged to exercise with the program and follow exercise prescription    Comments Michael Vaughn is a pleasant 64 year old male who is coming into cardiac rehab with a NSTEMI diagnosis. He is currently not  active with home exercise, but is ready to get back into it and feel better. He has his sister as a support system, and his two grandsons who are senior's in high school who stay with him most of the time and provide him company. His mobility is good, he states his left knee does bother him some from time to time. He is disabled, lives home alone, and is seperated from his wife. He does have an ICD in place. He is eager to start the program.    Expected Outcomes Short: Increase strength and stamina. Long: Improve SOB.    Continue Psychosocial Services  Follow up required by staff          Psychosocial Re-Evaluation:  Psychosocial Re-Evaluation     Row Name 08/31/24 1004 09/26/24 1006           Psychosocial Re-Evaluation   Current issues with None Identified Current Sleep Concerns      Comments Michael Vaughn is doing well in rehab. He stated that he does not have any stressors in his life other then just normal life stress. He also stated that his sleep is well. He is very interactive in class and talking to others . Michael Vaughn is doing well in rehab. He stated that he continues to  not have any major stressors. HE does sleep with a cpap at night. HE does have some nights that he is restless but they are not everynight.      Expected Outcomes Short: continue to have no stress   long: continue to exercise for lower stress level Short: continue to have no stress   long: continue to exercise for lower stress level      Interventions Encouraged to attend Cardiac Rehabilitation for the exercise Encouraged to attend Cardiac Rehabilitation for the exercise      Continue Psychosocial Services  Follow up required by staff Follow up required by staff         Psychosocial Discharge (Final Psychosocial Re-Evaluation):  Psychosocial Re-Evaluation - 09/26/24 1006       Psychosocial Re-Evaluation   Current issues with Current Sleep Concerns    Comments Michael Vaughn is doing well in rehab. He stated that he continues to   not have any major stressors. HE does sleep with a cpap at night. HE does have some nights that he is restless but they are not everynight.    Expected Outcomes Short: continue to have no stress   long: continue to exercise for lower stress level    Interventions Encouraged to attend Cardiac Rehabilitation for the exercise    Continue Psychosocial Services  Follow up required by staff          Vocational  Rehabilitation: Provide vocational rehab assistance to qualifying candidates.   Vocational Rehab Evaluation & Intervention:  Vocational Rehab - 08/10/24 1316       Initial Vocational Rehab Evaluation & Intervention   Assessment shows need for Vocational Rehabilitation No      Vocational Rehab Re-Evaulation   Comments He is disabled and not going back to work.          Education: Education Goals: Education classes will be provided on a weekly basis, covering required topics. Participant will state understanding/return demonstration of topics presented.  Learning Barriers/Preferences:  Learning Barriers/Preferences - 08/10/24 1316       Learning Barriers/Preferences   Learning Barriers None    Learning Preferences Group Instruction;Individual Instruction;Skilled Demonstration;Written Material          Education Topics: Hypertension, Hypertension Reduction -Define heart disease and high blood pressure. Discus how high blood pressure affects the body and ways to reduce high blood pressure. Flowsheet Row CARDIAC REHAB PHASE II EXERCISE from 08/04/2018 in [place] CARDIAC REHABILITATION  Date 07/28/18  Educator D. Coad  Instruction Review Code 2- Demonstrated Understanding    Exercise and Your Heart -Discuss why it is important to exercise, the FITT principles of exercise, normal and abnormal responses to exercise, and how to exercise safely. Flowsheet Row CARDIAC REHAB PHASE II EXERCISE from 05/14/2022 in [place] CARDIAC REHABILITATION  Date 04/02/22  Educator HJ   Instruction Review Code 1- Verbalizes Understanding    Angina -Discuss definition of angina, causes of angina, treatment of angina, and how to decrease risk of having angina. Flowsheet Row CARDIAC REHAB PHASE II EXERCISE from 05/14/2022 in [place] CARDIAC REHABILITATION  Date 04/09/22  Educator HJ  Instruction Review Code 1- Verbalizes Understanding    Cardiac Medications -Review what the following cardiac medications are used for, how they affect the body, and side effects that may occur when taking the medications.  Medications include Aspirin , Beta blockers, calcium channel blockers, ACE Inhibitors, angiotensin receptor blockers, diuretics, digoxin, and antihyperlipidemics. Flowsheet Row CARDIAC REHAB PHASE II EXERCISE from 05/14/2022 in [place] CARDIAC REHABILITATION  Date 04/16/22  Educator DF  Instruction Review Code 1- Verbalizes Understanding    Congestive Heart Failure -Discuss the definition of CHF, how to live with CHF, the signs and symptoms of CHF, and how keep track of weight and sodium intake. Flowsheet Row CARDIAC REHAB PHASE II EXERCISE from 05/14/2022 in [place] [facility]  Date 04/23/22  Educator pb  Instruction Review Code 1- Verbalizes Understanding    Heart Disease and Intimacy -Discus the effect sexual activity has on the heart, how changes occur during intimacy as we age, and safety during sexual activity. Flowsheet Row CARDIAC REHAB PHASE II EXERCISE from 05/14/2022 in [place] [facility]  Date 04/30/22  Educator pb  Instruction Review Code 1- Verbalizes Understanding    Smoking Cessation / COPD -Discuss different methods to quit smoking, the health benefits of quitting smoking, and the definition of COPD. Flowsheet Row CARDIAC REHAB PHASE II EXERCISE from 05/14/2022 in [place] [facility]  Date 05/07/22  Educator hj  Instruction Review Code 2- Demonstrated Understanding    Nutrition I: Fats -Discuss  the types of cholesterol, what cholesterol does to the heart, and how cholesterol levels can be controlled. Flowsheet Row CARDIAC REHAB PHASE II EXERCISE from 05/14/2022 in [place] [facility]  Date 05/14/22  Educator DF  Instruction Review Code 2- Demonstrated Understanding    Nutrition II: Labels -Discuss the different components of food  labels and how to read food label Flowsheet Row CARDIAC REHAB PHASE II EXERCISE from 08/04/2018 in [place] [facility]  Date 06/23/18  Educator CHARM Louder  Instruction Review Code 2- Demonstrated Understanding    Heart Parts/Heart Disease and PAD -Discuss the anatomy of the heart, the pathway of blood circulation through the heart, and these are affected by heart disease. Flowsheet Row CARDIAC REHAB PHASE II EXERCISE from 05/14/2022 in [place] [facility]  Date 03/05/22  Educator HJ  Instruction Review Code 2- Demonstrated Understanding    Stress I: Signs and Symptoms -Discuss the causes of stress, how stress may lead to anxiety and depression, and ways to limit stress. Flowsheet Row CARDIAC REHAB PHASE II EXERCISE from 05/14/2022 in [place] [facility]  Date 03/12/22  Educator hj  Instruction Review Code 2- Demonstrated Understanding    Stress II: Relaxation -Discuss different types of relaxation techniques to limit stress. Flowsheet Row CARDIAC REHAB PHASE II EXERCISE from 05/14/2022 in [place] [facility]  Date 03/19/22  Educator HJ  Instruction Review Code 1- Verbalizes Understanding    Warning Signs of Stroke / TIA -Discuss definition of a stroke, what the signs and symptoms are of a stroke, and how to identify when someone is having stroke.   Knowledge Questionnaire Score:  Knowledge Questionnaire Score - 08/15/24 1431       Knowledge Questionnaire Score   Pre Score 24/26          Core Components/Risk Factors/Patient Goals at Admission:  Personal Goals and  Risk Factors at Admission - 08/10/24 1314       Core Components/Risk Factors/Patient Goals on Admission    Weight Management Yes;Weight Maintenance    Intervention Weight Management: Develop a combined nutrition and exercise program designed to reach desired caloric intake, while maintaining appropriate intake of nutrient and fiber, sodium and fats, and appropriate energy expenditure required for the weight goal.;Weight Management: Provide education and appropriate resources to help participant work on and attain dietary goals.;Weight Management/Obesity: Establish reasonable short term and long term weight goals.    Expected Outcomes Short Term: Continue to assess and modify interventions until short term weight is achieved;Long Term: Adherence to nutrition and physical activity/exercise program aimed toward attainment of established weight goal;Weight Maintenance: Understanding of the daily nutrition guidelines, which includes 25-35% calories from fat, 7% or less cal from saturated fats, less than 200mg  cholesterol, less than 1.5gm of sodium, & 5 or more servings of fruits and vegetables daily;Weight Loss: Understanding of general recommendations for a balanced deficit meal plan, which promotes 1-2 lb weight loss per week and includes a negative energy balance of [phone] kcal/d;Understanding recommendations for meals to include 15-35% energy as protein, 25-35% energy from fat, 35-60% energy from carbohydrates, less than 200mg  of dietary cholesterol, 20-35 gm of total fiber daily;Understanding of distribution of calorie intake throughout the day with the consumption of 4-5 meals/snacks    Improve shortness of breath with ADL's Yes    Intervention Provide education, individualized exercise plan and daily activity instruction to help decrease symptoms of SOB with activities of daily living.    Expected Outcomes Short Term: Improve cardiorespiratory fitness to achieve a reduction of symptoms when performing  ADLs;Long Term: Be able to perform more ADLs without symptoms or delay the onset of symptoms    Diabetes Yes    Intervention Provide education about signs/symptoms and action to take for hypo/hyperglycemia.;Provide education about proper nutrition, including hydration, and aerobic/resistive exercise prescription along with prescribed medications  to achieve blood glucose in normal ranges: Fasting glucose 65-99 mg/dL    Expected Outcomes Short Term: Participant verbalizes understanding of the signs/symptoms and immediate care of hyper/hypoglycemia, proper foot care and importance of medication, aerobic/resistive exercise and nutrition plan for blood glucose control.;Long Term: Attainment of HbA1C < 7%.    Heart Failure Yes    Intervention Provide a combined exercise and nutrition program that is supplemented with education, support and counseling about heart failure. Directed toward relieving symptoms such as shortness of breath, decreased exercise tolerance, and extremity edema.    Expected Outcomes Improve functional capacity of life;Short term: Attendance in program 2-3 days a week with increased exercise capacity. Reported lower sodium intake. Reported increased fruit and vegetable intake. Reports medication compliance.;Short term: Daily weights obtained and reported for increase. Utilizing diuretic protocols set by physician.;Long term: Adoption of self-care skills and reduction of barriers for early signs and symptoms recognition and intervention leading to self-care maintenance.    Hypertension Yes    Intervention Provide education on lifestyle modifcations including regular physical activity/exercise, weight management, moderate sodium restriction and increased consumption of fresh fruit, vegetables, and low fat dairy, alcohol moderation, and smoking cessation.;Monitor prescription use compliance.    Expected Outcomes Short Term: Continued assessment and intervention until BP is < 140/5mm HG in  hypertensive participants. < 130/67mm HG in hypertensive participants with diabetes, heart failure or chronic kidney disease.;Long Term: Maintenance of blood pressure at goal levels.    Lipids Yes    Intervention Provide education and support for participant on nutrition & aerobic/resistive exercise along with prescribed medications to achieve LDL 70mg , HDL >40mg .    Expected Outcomes Short Term: Participant states understanding of desired cholesterol values and is compliant with medications prescribed. Participant is following exercise prescription and nutrition guidelines.;Long Term: Cholesterol controlled with medications as prescribed, with individualized exercise RX and with personalized nutrition plan. Value goals: LDL < 70mg , HDL > 40 mg.          Core Components/Risk Factors/Patient Goals Review:   Goals and Risk Factor Review     Row Name 08/31/24 1009 09/26/24 1010           Core Components/Risk Factors/Patient Goals Review   Personal Goals Review Weight Management/Obesity;Heart Failure;Hypertension Weight Management/Obesity;Heart Failure;Hypertension      Review Michael Vaughn has been doing well in rehab. He is diabetic and has been good with controling that. He watches his sweets and also his portion control when eating. He take his medications daily and also weigh himself each morning. He is doing well in rehab and enjoys coming. Michael Vaughn has been doing well in rehab. He continues with doing well at controling his diabeties. He watches his sweets and also his portion control when eating. He take his medications daily and also weigh himself each morning. He is doing well in rehab and enjoys coming.      Expected Outcomes Short: continue to weigh youseld   long: continue to exerise Short: continue to weigh youseld   long: continue to exerise         Core Components/Risk Factors/Patient Goals at Discharge (Final Review):   Goals and Risk Factor Review - 09/26/24 1010       Core  Components/Risk Factors/Patient Goals Review   Personal Goals Review Weight Management/Obesity;Heart Failure;Hypertension    Review Michael Vaughn has been doing well in rehab. He continues with doing well at controling his diabeties. He watches his sweets and also his portion control when eating. He take his medications  daily and also weigh himself each morning. He is doing well in rehab and enjoys coming.    Expected Outcomes Short: continue to weigh youseld   long: continue to exerise          ITP Comments:  ITP Comments     Row Name 08/10/24 1309 08/15/24 1508 08/17/24 0924 08/23/24 0934 09/20/24 1306   ITP Comments Completed virtual orientation today.  EP evaluation is scheduled for 08/15/24 at 2:15 pm.  Documentation for diagnosis can be found in [facility] encounter. Patient attend orientation today.  Patient is attending Cardiac Rehabilitation Program.  Documentation for diagnosis can be found in [facility].  Reviewed medical chart, RPE/RPD, gym safety, and program guidelines.  Patient was fitted to equipment they will be using during rehab.  Patient is scheduled to start exercise on 08/17/24.   Initial ITP created and sent for review and signature by Dr. Dorn Ross, Medical Director for Cardiac Rehabilitation Program. First full day of exercise!  Patient was oriented to gym and equipment including functions, settings, policies, and procedures.  Patient's individual exercise prescription and treatment plan were reviewed.  All starting workloads were established based on the results of the 6 minute walk test done at initial orientation visit.  The plan for exercise progression was also introduced and progression will be customized based on patient's performance and goals. 30 day review completed. ITP sent to Dr. Dorn Ross, Medical Director of Cardiac Rehab. Continue with ITP unless changes are made by physician.    New to program 30 day review completed. ITP sent to Dr. Dorn Ross, Medical Director of  Cardiac Rehab. Continue with ITP unless changes are made by physician.    Row Name 10/18/24 1010           ITP Comments 30 day review completed. ITP sent to Dr. Dorn Ross, Medical Director of Cardiac Rehab. Continue with ITP unless changes are made by physician.          Comments: 30 day review     [1]  Current Outpatient Medications:    allopurinol  (ZYLOPRIM ) 300 MG tablet, Take 300 mg by mouth daily., Disp: , Rfl:    aspirin  325 MG tablet, Take 325 mg by mouth daily., Disp: , Rfl:    finasteride (PROSCAR) 5 MG tablet, Take 5 mg by mouth daily., Disp: , Rfl:    fluticasone (FLONASE) 50 MCG/ACT nasal spray, Place 1 spray into both nostrils daily as needed for allergies., Disp: , Rfl:    glipiZIDE  (GLUCOTROL  XL) 10 MG 24 hr tablet, Take 10 mg by mouth daily.   (Patient not taking: Reported on 08/10/2024), Disp: , Rfl:    HYDROcodone -acetaminophen  (NORCO/VICODIN) 5-325 MG tablet, Take 1 tablet by mouth every 12 (twelve) hours as needed for moderate pain., Disp: , Rfl:    insulin  glargine (LANTUS) 100 UNIT/ML injection, Inject 24 Units into the skin daily., Disp: , Rfl:    magnesium oxide (MAG-OX) 400 MG tablet, Take 400 mg by mouth daily., Disp: , Rfl:    meclizine (ANTIVERT) 25 MG tablet, Take 1 tablet by mouth 3 (three) times daily as needed for dizziness., Disp: , Rfl:    metFORMIN  (GLUCOPHAGE ) 1000 MG tablet, Take 1,000 mg by mouth 2 (two) times daily with a meal., Disp: , Rfl:    metoprolol  succinate (TOPROL -XL) 50 MG 24 hr tablet, Take 50 mg by mouth daily., Disp: , Rfl:    Multiple Vitamin (MULTI-VITAMINS) TABS, Take 1 tablet daily by mouth., Disp: , Rfl:  nitroGLYCERIN  (NITROSTAT ) 0.4 MG SL tablet, Place 0.4 mg under the tongue. (Patient not taking: Reported on 08/10/2024), Disp: , Rfl:    omega-3 acid ethyl esters (LOVAZA ) 1 g capsule, Take 1 g daily by mouth., Disp: , Rfl:    Polyethyl Glycol-Propyl Glycol (SYSTANE) 0.4-0.3 % SOLN, Place 1 application. into both eyes  daily as needed (Dry eyes)., Disp: , Rfl:    rosuvastatin (CRESTOR) 40 MG tablet, Take 40 mg by mouth daily., Disp: , Rfl:    sacubitril-valsartan (ENTRESTO) 24-26 MG, Take 1 tablet by mouth 2 (two) times daily., Disp: , Rfl:    senna-docusate (SENOKOT-S) 8.6-50 MG tablet, Take 2 tablets by mouth 2 (two) times daily as needed for mild constipation or moderate constipation., Disp: , Rfl:    spironolactone  (ALDACTONE ) 25 MG tablet, Take 12.5 mg daily by mouth., Disp: , Rfl:    tadalafil (CIALIS) 5 MG tablet, Take 5 mg by mouth daily., Disp: , Rfl:    tamsulosin  (FLOMAX ) 0.4 MG CAPS capsule, Take 0.4 mg by mouth daily., Disp: , Rfl:    torsemide (DEMADEX) 20 MG tablet, Take 60 mg by mouth daily., Disp: , Rfl:  [2]  Social History Tobacco Use  Smoking Status Former   Current packs/day: 0.00   Average packs/day: 1 pack/day for 5.0 years (5.0 ttl pk-yrs)   Types: Cigarettes   Start date: 09/22/1984   Quit date: 09/22/1989   Years since quitting: 35.0  Smokeless Tobacco Former   Types: Snuff   Quit date: 09/22/1989  Tobacco Comments   dipped snuff for one year

## 2024-10-19 ENCOUNTER — Ambulatory Visit (HOSPITAL_COMMUNITY)

## 2024-10-21 ENCOUNTER — Encounter (HOSPITAL_COMMUNITY)

## 2024-10-24 ENCOUNTER — Encounter (HOSPITAL_COMMUNITY)

## 2024-10-26 ENCOUNTER — Encounter (HOSPITAL_COMMUNITY): Admission: RE | Admit: 2024-10-26 | Source: Ambulatory Visit

## 2024-10-28 ENCOUNTER — Encounter (HOSPITAL_COMMUNITY)

## 2024-10-31 ENCOUNTER — Encounter (HOSPITAL_COMMUNITY)

## 2024-11-02 ENCOUNTER — Encounter (HOSPITAL_COMMUNITY)

## 2024-11-04 ENCOUNTER — Ambulatory Visit (HOSPITAL_COMMUNITY)

## 2024-11-07 ENCOUNTER — Ambulatory Visit (HOSPITAL_COMMUNITY)
